# Patient Record
Sex: Female | Born: 1980 | Race: Black or African American | Hispanic: No | Marital: Single | Smoking: Current every day smoker
Health system: Southern US, Community
[De-identification: ages and names within clinical notes are randomized; demographics above are authoritative.]

## PROBLEM LIST (undated history)

## (undated) DIAGNOSIS — Z789 Other specified health status: Secondary | ICD-10-CM

## (undated) DIAGNOSIS — F1010 Alcohol abuse, uncomplicated: Secondary | ICD-10-CM

## (undated) DIAGNOSIS — F1990 Other psychoactive substance use, unspecified, uncomplicated: Secondary | ICD-10-CM

## (undated) HISTORY — PX: NO PAST SURGERIES: SHX2092

---

## 2011-04-28 ENCOUNTER — Inpatient Hospital Stay (INDEPENDENT_AMBULATORY_CARE_PROVIDER_SITE_OTHER)
Admission: RE | Admit: 2011-04-28 | Discharge: 2011-04-28 | Disposition: A | Discharge status: Home / Self Care | Payer: Medicaid Other | Source: Ambulatory Visit | Attending: Family Medicine | Admitting: Family Medicine

## 2011-04-28 DIAGNOSIS — K029 Dental caries, unspecified: Secondary | ICD-10-CM

## 2011-04-28 DIAGNOSIS — S025XXA Fracture of tooth (traumatic), initial encounter for closed fracture: Secondary | ICD-10-CM

## 2011-04-28 DIAGNOSIS — K089 Disorder of teeth and supporting structures, unspecified: Secondary | ICD-10-CM

## 2013-06-18 ENCOUNTER — Emergency Department (INDEPENDENT_AMBULATORY_CARE_PROVIDER_SITE_OTHER): Payer: Medicaid Other

## 2013-06-18 ENCOUNTER — Emergency Department (HOSPITAL_COMMUNITY)
Admission: EM | Admit: 2013-06-18 | Discharge: 2013-06-18 | Disposition: A | Discharge status: Home / Self Care | Payer: Medicaid Other | Source: Home / Self Care | Attending: Emergency Medicine | Admitting: Emergency Medicine

## 2013-06-18 ENCOUNTER — Encounter (HOSPITAL_COMMUNITY): Payer: Self-pay | Admitting: Emergency Medicine

## 2013-06-18 DIAGNOSIS — J40 Bronchitis, not specified as acute or chronic: Secondary | ICD-10-CM

## 2013-06-18 MED ORDER — HYDROCODONE-HOMATROPINE 5-1.5 MG/5ML PO SYRP: 5.0000 mL | ORAL_SOLUTION | Freq: Four times a day (QID) | ORAL | Status: DC | PRN

## 2013-06-18 MED ORDER — AZITHROMYCIN 250 MG PO TABS: 250.0000 mg | ORAL_TABLET | Freq: Every day | ORAL | Status: DC

## 2013-06-18 NOTE — ED Provider Notes (Signed)
  CSN: [id number]     Arrival date & time 06/18/13  5784 History     First MD Initiated Contact with Patient 06/18/13 [phone]     Chief Complaint  Patient presents with  . Cough   (Consider location/radiation/quality/duration/timing/severity/associated sxs/prior Treatment) Patient is a 32 y.o. female presenting with cough. The history is provided by the patient. No language interpreter was used.  Cough Cough characteristics:  Productive Sputum characteristics:  Yellow Severity:  Moderate Onset quality:  Gradual Timing:  Constant Progression:  Worsening Chronicity:  New Smoker: no   Relieved by:  Nothing Worsened by:  Nothing tried Associated symptoms: chest pain   Pt reports she has a cough and congestion.  Pt reports chest hurts to cough  History reviewed. No pertinent past medical history. History reviewed. No pertinent past surgical history. No family history on file. History  Substance Use Topics  . Smoking status: Current Every Day Smoker  . Smokeless tobacco: Not on file  . Alcohol Use: No   OB History   Grav Para Term Preterm Abortions TAB SAB Ect Mult Living                 Review of Systems  Respiratory: Positive for cough.   Cardiovascular: Positive for chest pain.  All other systems reviewed and are negative.    Allergies  Review of patient's allergies indicates no known allergies.  Home Medications   Current Outpatient Rx  Name  Route  Sig  Dispense  Refill  . Chlorphen-Pseudoephed-APAP (THERAFLU FLU/COLD PO)   Oral   Take by mouth.         . GuaiFENesin (MUCINEX PO)   Oral   Take by mouth.          BP 135/96  Pulse 101  Temp(Src) 98.3 F (36.8 C) (Oral)  Resp 22  SpO2 99%  LMP 06/08/2013 Physical Exam  Constitutional: She is oriented to person, place, and time. She appears well-developed and well-nourished.  HENT:  Head: Normocephalic.  Right Ear: External ear normal.  Left Ear: External ear normal.  Eyes: Conjunctivae and EOM  are normal. Pupils are equal, round, and reactive to light.  Neck: Normal range of motion.  Cardiovascular: Normal rate and normal heart sounds.   Pulmonary/Chest: Effort normal and breath sounds normal.  Abdominal: Soft.  Musculoskeletal: Normal range of motion.  Neurological: She is alert and oriented to person, place, and time. She has normal reflexes.  Skin: Skin is warm.  Psychiatric: She has a normal mood and affect.    ED Course   Procedures (including critical care time)  Labs Reviewed - No data to display Dg Chest 2 View  06/18/2013   *RADIOLOGY REPORT*  Clinical Data: 4-day history of cough.  CHEST - 2 VIEW  Comparison: None.  Findings: Cardiomediastinal silhouette unremarkable.  Lungs hyperinflated with mild central peribronchial thickening.  No confluent airspace consolidation.  No pleural effusions. Visualized bony thorax intact.  IMPRESSION: Hyperinflation and mild central peribronchial thickening consistent with asthma and/or bronchitis.  No evidence of localized airspace pneumonia.   Original Report Authenticated By: Hulan Saas, M.D.   1. Bronchitis     MDM  Rx for zithromax and hydromet cough syrup.   Stop smoking  Elson Areas, [place] 06/18/13 1017

## 2013-06-18 NOTE — ED Notes (Signed)
Chest congestion, feels like congestion in chest that she cannot cough phlegm up.  Patient denies sinus drainage, but sounds congested to this nurse.  Reports sore throat, both ears hurt , and coughing spells she is unable to control.

## 2013-06-18 NOTE — ED Provider Notes (Signed)
Medical screening examination/treatment/procedure(s) were performed by non-physician practitioner and as supervising physician I was immediately available for consultation/collaboration.  Leslee Home, M.D.  Reuben Likes, MD 06/18/13 [phone]

## 2013-10-31 ENCOUNTER — Inpatient Hospital Stay (HOSPITAL_COMMUNITY): Payer: Self-pay

## 2013-10-31 ENCOUNTER — Encounter (HOSPITAL_COMMUNITY): Payer: Self-pay

## 2013-10-31 ENCOUNTER — Inpatient Hospital Stay (HOSPITAL_COMMUNITY)
Admission: AD | Admit: 2013-10-31 | Discharge: 2013-10-31 | Discharge status: Against Medical Advice | Payer: Self-pay | Source: Ambulatory Visit | Attending: Obstetrics and Gynecology | Admitting: Obstetrics and Gynecology

## 2013-10-31 DIAGNOSIS — O99330 Smoking (tobacco) complicating pregnancy, unspecified trimester: Secondary | ICD-10-CM | POA: Insufficient documentation

## 2013-10-31 DIAGNOSIS — O26859 Spotting complicating pregnancy, unspecified trimester: Secondary | ICD-10-CM | POA: Insufficient documentation

## 2013-10-31 DIAGNOSIS — R109 Unspecified abdominal pain: Secondary | ICD-10-CM | POA: Insufficient documentation

## 2013-10-31 DIAGNOSIS — N898 Other specified noninflammatory disorders of vagina: Secondary | ICD-10-CM | POA: Insufficient documentation

## 2013-10-31 DIAGNOSIS — O99891 Other specified diseases and conditions complicating pregnancy: Secondary | ICD-10-CM | POA: Insufficient documentation

## 2013-10-31 DIAGNOSIS — O9989 Other specified diseases and conditions complicating pregnancy, childbirth and the puerperium: Principal | ICD-10-CM

## 2013-10-31 LAB — CBC
HEMATOCRIT: 31.1 % — AB (ref 36.0–46.0)
Hemoglobin: 10.3 g/dL — ABNORMAL LOW (ref 12.0–15.0)
MCH: 31.1 pg (ref 26.0–34.0)
MCHC: 33.1 g/dL (ref 30.0–36.0)
MCV: 94 fL (ref 78.0–100.0)
PLATELETS: 223 10*3/uL (ref 150–400)
RBC: 3.31 MIL/uL — ABNORMAL LOW (ref 3.87–5.11)
RDW: 14.3 % (ref 11.5–15.5)
WBC: 5.1 10*3/uL (ref 4.0–10.5)

## 2013-10-31 LAB — URINALYSIS, ROUTINE W REFLEX MICROSCOPIC
Bilirubin Urine: NEGATIVE
Glucose, UA: NEGATIVE mg/dL
HGB URINE DIPSTICK: NEGATIVE
Ketones, ur: NEGATIVE mg/dL
Leukocytes, UA: NEGATIVE
Nitrite: NEGATIVE
PH: 6 (ref 5.0–8.0)
Protein, ur: NEGATIVE mg/dL
SPECIFIC GRAVITY, URINE: 1.025 (ref 1.005–1.030)
UROBILINOGEN UA: 0.2 mg/dL (ref 0.0–1.0)

## 2013-10-31 LAB — WET PREP, GENITAL
Trich, Wet Prep: NONE SEEN
YEAST WET PREP: NONE SEEN

## 2013-10-31 LAB — HCG, QUANTITATIVE, PREGNANCY: hCG, Beta Chain, Quant, S: 242 m[IU]/mL — ABNORMAL HIGH (ref <5)

## 2013-10-31 LAB — ABO/RH: ABO/RH(D): O POS

## 2013-10-31 LAB — POCT PREGNANCY, URINE: PREG TEST UR: POSITIVE — AB

## 2013-10-31 NOTE — MAU Note (Signed)
Patient states she went to [facility] on 12-31 for a Depo shot and had a positive pregnancy test. Patient states she has not missed a period but her last period lasted 2 weeks. States she has been having a little cramping. No bleeding or discharge today.

## 2013-10-31 NOTE — MAU Provider Note (Signed)
History     CSN: [id number]  Arrival date and time: 10/31/13 1020   First Provider Initiated Contact with Patient 10/31/13 1221      Chief Complaint  Patient presents with  . Possible Pregnancy  . Abdominal Cramping   HPI Judith Lowe is a 33 YO G3P2002 female who is [redacted]w[redacted]d presenting to MAU c/o abdominal cramping and vaginal spotting.  Patient went to start Depo Shot on 12/31 and had positive pregnancy test.  Describes having a period in October, November, and lightly spotted for 2 weeks straight in December.  She is sexually active with one partner.  No birth control use for four years.  No significant PMH.  Has not been taking prenatal vitamins.  Admits to vaginal discharge (white, thick) with odor.  Denies nausea, vomiting, fever, vaginal itching, burning, dysuria, constipation, diarrhea, rectal bleeding.  Previous pregnancies were vaginal deliveries at term - no known complications.  History reviewed. No pertinent past medical history.  History reviewed. No pertinent past surgical history.  History reviewed. No pertinent family history.  History  Substance Use Topics  . Smoking status: Current Every Day Smoker -- 0.25 packs/day  . Smokeless tobacco: Not on file  . Alcohol Use: No    Allergies: No Known Allergies  Prescriptions prior to admission  Medication Sig Dispense Refill  . azithromycin (ZITHROMAX) 250 MG tablet Take 1 tablet (250 mg total) by mouth daily. Take first 2 tablets together, then 1 every day until finished.  6 tablet  0  . Chlorphen-Pseudoephed-APAP (THERAFLU FLU/COLD PO) Take by mouth.      . GuaiFENesin (MUCINEX PO) Take by mouth.      Marland Kitchen HYDROcodone-homatropine (HYDROMET) 5-1.5 MG/5ML syrup Take 5 mLs by mouth every 6 (six) hours as needed for cough.  120 mL  0    Review of Systems  Constitutional: Negative for fever and chills.  Respiratory: Negative for shortness of breath.   Cardiovascular: Negative for chest pain.  Gastrointestinal: Positive for  abdominal pain (RLQ). Negative for nausea, vomiting, diarrhea, constipation and blood in stool.  Genitourinary: Negative for dysuria, urgency and frequency.       Vaginal discharge (white, thick)  Skin: Negative for rash.   Physical Exam   Blood pressure 128/77, pulse 70, temperature 98.5 F (36.9 C), temperature source Oral, resp. rate 16, height 5' 7.75" (1.721 m), weight 65.318 kg (144 lb), last menstrual period 10/08/2013, SpO2 100.00%.  Physical Exam  Constitutional: She is oriented to person, place, and time. She appears well-developed and well-nourished.  HENT:  Head: Normocephalic.  Neck: Normal range of motion. Neck supple.  Cardiovascular: Normal rate, regular rhythm and normal heart sounds.   Respiratory: Effort normal and breath sounds normal. No respiratory distress.  GI: Soft. She exhibits no mass. There is tenderness (RLQ). There is no rebound and no guarding.  Genitourinary: Uterus is not tender. Cervix exhibits discharge. Cervix exhibits no motion tenderness and no friability. Right adnexum displays tenderness. Left adnexum displays no tenderness. Vaginal discharge (white, thin) found.  Lymphadenopathy:       Right: Inguinal adenopathy present.  Neurological: She is alert and oriented to person, place, and time.  Skin: Skin is warm and dry.   Pt left without receiving ultrasound due to needing to pick up daughter.  Notified by RN regarding departure after patient had left the building > pt called at number listed for home contact [phone] > disconnected.    MAU Course  Procedures   Assessment and Plan  [facility] 10/31/2013, 12:33 PM

## 2013-10-31 NOTE — MAU Note (Signed)
[facility] but was told she had a + pregnancy test. She now presents to MAU with cramps today but no bleeding.

## 2013-11-01 LAB — GC/CHLAMYDIA PROBE AMP
CT Probe RNA: NEGATIVE
GC PROBE AMP APTIMA: NEGATIVE

## 2013-11-01 NOTE — MAU Provider Note (Signed)
Attestation of Attending Supervision of Advanced Practitioner (CNM/NP): Evaluation and management procedures were performed by the Advanced Practitioner under my supervision and collaboration.  I have reviewed the Advanced Practitioner's note and chart, and I agree with the management and plan.  Yossi Hinchman 11/01/2013 7:38 AM

## 2013-12-11 ENCOUNTER — Inpatient Hospital Stay (HOSPITAL_COMMUNITY): Payer: Self-pay

## 2013-12-11 ENCOUNTER — Encounter (HOSPITAL_COMMUNITY): Payer: Self-pay | Admitting: *Deleted

## 2013-12-11 ENCOUNTER — Inpatient Hospital Stay (HOSPITAL_COMMUNITY)
Admission: AD | Admit: 2013-12-11 | Discharge: 2013-12-11 | Disposition: A | Discharge status: Home / Self Care | Payer: Self-pay | Source: Ambulatory Visit | Attending: Obstetrics & Gynecology | Admitting: Obstetrics & Gynecology

## 2013-12-11 DIAGNOSIS — B9689 Other specified bacterial agents as the cause of diseases classified elsewhere: Secondary | ICD-10-CM

## 2013-12-11 DIAGNOSIS — O039 Complete or unspecified spontaneous abortion without complication: Secondary | ICD-10-CM

## 2013-12-11 DIAGNOSIS — N760 Acute vaginitis: Secondary | ICD-10-CM

## 2013-12-11 DIAGNOSIS — F17200 Nicotine dependence, unspecified, uncomplicated: Secondary | ICD-10-CM | POA: Insufficient documentation

## 2013-12-11 DIAGNOSIS — O036 Delayed or excessive hemorrhage following complete or unspecified spontaneous abortion: Secondary | ICD-10-CM | POA: Insufficient documentation

## 2013-12-11 DIAGNOSIS — A499 Bacterial infection, unspecified: Secondary | ICD-10-CM | POA: Insufficient documentation

## 2013-12-11 HISTORY — DX: Other specified health status: Z78.9

## 2013-12-11 LAB — URINALYSIS, ROUTINE W REFLEX MICROSCOPIC
Bilirubin Urine: NEGATIVE
GLUCOSE, UA: NEGATIVE mg/dL
Ketones, ur: NEGATIVE mg/dL
Nitrite: NEGATIVE
Protein, ur: 30 mg/dL — AB
Urobilinogen, UA: 1 mg/dL (ref 0.0–1.0)
pH: 5.5 (ref 5.0–8.0)

## 2013-12-11 LAB — CBC
HEMATOCRIT: 33.6 % — AB (ref 36.0–46.0)
HEMOGLOBIN: 11.2 g/dL — AB (ref 12.0–15.0)
MCH: 31.3 pg (ref 26.0–34.0)
MCHC: 33.3 g/dL (ref 30.0–36.0)
MCV: 93.9 fL (ref 78.0–100.0)
Platelets: 171 10*3/uL (ref 150–400)
RBC: 3.58 MIL/uL — AB (ref 3.87–5.11)
RDW: 13.8 % (ref 11.5–15.5)
WBC: 8.7 10*3/uL (ref 4.0–10.5)

## 2013-12-11 LAB — URINE MICROSCOPIC-ADD ON

## 2013-12-11 LAB — WET PREP, GENITAL
TRICH WET PREP: NONE SEEN
Yeast Wet Prep HPF POC: NONE SEEN

## 2013-12-11 LAB — HCG, QUANTITATIVE, PREGNANCY: hCG, Beta Chain, Quant, S: 11 m[IU]/mL — ABNORMAL HIGH (ref <5)

## 2013-12-11 MED ORDER — OXYCODONE-ACETAMINOPHEN 5-325 MG PO TABS: 1.0000 | ORAL_TABLET | ORAL | Status: DC | PRN

## 2013-12-11 MED ORDER — HYDROMORPHONE HCL PF 1 MG/ML IJ SOLN
1.0000 mg | Freq: Once | INTRAMUSCULAR | Status: AC
  Administered 2013-12-11: 1 mg via INTRAMUSCULAR
  Filled 2013-12-11: qty 1

## 2013-12-11 MED ORDER — AZITHROMYCIN 250 MG PO TABS
1000.0000 mg | ORAL_TABLET | Freq: Once | ORAL | Status: AC
  Administered 2013-12-11: 1000 mg via ORAL
  Filled 2013-12-11: qty 4

## 2013-12-11 MED ORDER — CEFTRIAXONE SODIUM 250 MG IJ SOLR
250.0000 mg | Freq: Once | INTRAMUSCULAR | Status: AC
  Administered 2013-12-11: 250 mg via INTRAMUSCULAR
  Filled 2013-12-11: qty 250

## 2013-12-11 MED ORDER — METRONIDAZOLE 500 MG PO TABS: 500.0000 mg | ORAL_TABLET | Freq: Two times a day (BID) | ORAL | Status: DC

## 2013-12-11 NOTE — MAU Note (Signed)
Started bleeding last night at work, kind of heavy like a period, past a few quarter sized clots this morning. Cramping in lower abd.

## 2013-12-11 NOTE — MAU Provider Note (Signed)
History     CSN: [id number]  Arrival date and time: 12/11/13 1346   First Provider Initiated Contact with Patient 12/11/13 1420      Chief Complaint  Patient presents with  . Vaginal Bleeding   HPI [name] is a 33 y.o. G3P2002 at [redacted]w[redacted]d who presents to MAU today with complaint of vaginal bleeding since last night. She states that the bleeding is similar to a normal period with small clots. She is also having moderate lower abdominal cramping. She rates her pain at 7/10 now. She denies N/V/D, UTI symptoms, vaginal discharge or fever.   OB History   Grav Para Term Preterm Abortions TAB SAB Ect Mult Living   3 2 2       2       Past Medical History  Diagnosis Date  . Medical history non-contributory     Past Surgical History  Procedure Laterality Date  . No past surgeries      History reviewed. No pertinent family history.  History  Substance Use Topics  . Smoking status: Current Every Day Smoker -- 0.25 packs/day for 5 years    Types: Cigarettes  . Smokeless tobacco: Never Used  . Alcohol Use: Yes     Comment: occaisionally prior to preg    Allergies: No Known Allergies  No prescriptions prior to admission    Review of Systems  Constitutional: Positive for malaise/fatigue. Negative for fever.  Gastrointestinal: Positive for abdominal pain. Negative for nausea, vomiting, diarrhea and constipation.  Genitourinary: Negative for dysuria, urgency and frequency.       Neg - vaginal discharge + vaginal bleeding  Neurological: Negative for dizziness, loss of consciousness and weakness.   Physical Exam   Blood pressure 140/80, pulse 102, temperature 100.1 F (37.8 C), temperature source Oral, resp. rate 18, height 5\' 8"  (1.727 m), weight 146 lb (66.225 kg), last menstrual period 10/08/2013.  Physical Exam  Constitutional: She is oriented to person, place, and time. She appears well-developed and well-nourished. No distress.  HENT:  Head: Normocephalic and  atraumatic.  Cardiovascular: Normal rate, regular rhythm and normal heart sounds.   Respiratory: Effort normal and breath sounds normal. No respiratory distress.  GI: Soft. Bowel sounds are normal. She exhibits no distension and no mass. There is tenderness (moderate tenderness to palpation of the lower abdomen bilaterally). There is no rebound and no guarding.  Genitourinary: Uterus is enlarged and tender. Cervix exhibits no motion tenderness, no discharge and no friability. Right adnexum displays tenderness (mild to moderate). Right adnexum displays no mass. Left adnexum displays tenderness. Left adnexum displays no mass. There is bleeding (small amount of thin, light brown blood noted) around the vagina. No vaginal discharge found.  Neurological: She is alert and oriented to person, place, and time.  Skin: Skin is warm and dry. No erythema.  Psychiatric: She has a normal mood and affect.   Results for orders placed during the hospital encounter of 12/11/13 (from the past 24 hour(s))  WET PREP, GENITAL     Status: Abnormal   Collection Time    12/11/13  2:00 PM      Result Value Ref Range   Yeast Wet Prep HPF POC NONE SEEN  NONE SEEN   Trich, Wet Prep NONE SEEN  NONE SEEN   Clue Cells Wet Prep HPF POC MODERATE (*) NONE SEEN   WBC, Wet Prep HPF POC TOO NUMEROUS TO COUNT (*) NONE SEEN  URINALYSIS, ROUTINE W REFLEX MICROSCOPIC  Status: Abnormal   Collection Time    12/11/13  2:05 PM      Result Value Ref Range   Color, Urine AMBER (*) YELLOW   APPearance CLOUDY (*) CLEAR   Specific Gravity, Urine >1.030 (*) 1.005 - 1.030   pH 5.5  5.0 - 8.0   Glucose, UA NEGATIVE  NEGATIVE mg/dL   Hgb urine dipstick LARGE (*) NEGATIVE   Bilirubin Urine NEGATIVE  NEGATIVE   Ketones, ur NEGATIVE  NEGATIVE mg/dL   Protein, ur 30 (*) NEGATIVE mg/dL   Urobilinogen, UA 1.0  0.0 - 1.0 mg/dL   Nitrite NEGATIVE  NEGATIVE   Leukocytes, UA MODERATE (*) NEGATIVE  URINE MICROSCOPIC-ADD ON     Status: Abnormal    Collection Time    12/11/13  2:05 PM      Result Value Ref Range   Squamous Epithelial / LPF MANY (*) RARE   WBC, UA TOO NUMEROUS TO COUNT  <3 WBC/hpf   RBC / HPF 21-50  <3 RBC/hpf   Bacteria, UA RARE  RARE   Urine-Other MUCOUS PRESENT    CBC     Status: Abnormal   Collection Time    12/11/13  2:35 PM      Result Value Ref Range   WBC 8.7  4.0 - 10.5 K/uL   RBC 3.58 (*) 3.87 - 5.11 MIL/uL   Hemoglobin 11.2 (*) 12.0 - 15.0 g/dL   HCT 33.6 (*) 36.0 - 46.0 %   MCV 93.9  78.0 - 100.0 fL   MCH 31.3  26.0 - 34.0 pg   MCHC 33.3  30.0 - 36.0 g/dL   RDW 13.8  11.5 - 15.5 %   Platelets 171  150 - 400 K/uL  HCG, QUANTITATIVE, PREGNANCY     Status: Abnormal   Collection Time    12/11/13  2:35 PM      Result Value Ref Range   hCG, Beta Chain, Quant, S 11 (*) <5 mIU/mL   US Ob Comp Less 14 Wks  12/11/2013   CLINICAL DATA:  Vaginal bleeding.  Unsure of LMP.  EXAM: OBSTETRIC <14 WK [place] AND TRANSVAGINAL OB US  TECHNIQUE: Both transabdominal and transvaginal ultrasound examinations were performed for complete evaluation of the gestation as well as the maternal uterus, adnexal regions, and pelvic cul-de-sac. Transvaginal technique was performed to assess early pregnancy.  COMPARISON:  None.  FINDINGS: No intrauterine gestational sac visualized. The endometrium is diffusely thickened measuring approximately 30 mm and shows multiple small hypoechoic cystic foci internally. There is also increased vascularity the adjacent uterine myometrium. This raises suspicion for gestational trophoblastic neoplasia.  A single well-circumscribed myometrial mass measuring 2.3 cm is seen in the right anterior corpus, consistent with a small uterine fibroid.  Both ovaries are normal in appearance. No adnexal masses are identified. Tiny amount of simple free fluid seen in pelvic cul-de-sac.  IMPRESSION: No IUP or adnexal mass visualized.  Diffuse endometrial thickening with numerous tiny cystic foci, and hypervascularity of  adjacent uterine myometrium. Differential diagnosis includes just a struck trophoblastic neoplasia in retained products of conception. Less likely considerations including atypical endometrial thickening due to IUP too early to visualize or occult ectopic pregnancy. Recommend correlation with quantitative beta HCG level, and followup by ultrasound if clinically warranted.   Electronically Signed   By: Earle Gell M.D.   On: 12/11/2013 15:34   US Ob Transvaginal  12/11/2013   CLINICAL DATA:  Vaginal bleeding.  Unsure of LMP.  EXAM: OBSTETRIC <14  WK [place] AND TRANSVAGINAL OB US  TECHNIQUE: Both transabdominal and transvaginal ultrasound examinations were performed for complete evaluation of the gestation as well as the maternal uterus, adnexal regions, and pelvic cul-de-sac. Transvaginal technique was performed to assess early pregnancy.  COMPARISON:  None.  FINDINGS: No intrauterine gestational sac visualized. The endometrium is diffusely thickened measuring approximately 30 mm and shows multiple small hypoechoic cystic foci internally. There is also increased vascularity the adjacent uterine myometrium. This raises suspicion for gestational trophoblastic neoplasia.  A single well-circumscribed myometrial mass measuring 2.3 cm is seen in the right anterior corpus, consistent with a small uterine fibroid.  Both ovaries are normal in appearance. No adnexal masses are identified. Tiny amount of simple free fluid seen in pelvic cul-de-sac.  IMPRESSION: No IUP or adnexal mass visualized.  Diffuse endometrial thickening with numerous tiny cystic foci, and hypervascularity of adjacent uterine myometrium. Differential diagnosis includes just a struck trophoblastic neoplasia in retained products of conception. Less likely considerations including atypical endometrial thickening due to IUP too early to visualize or occult ectopic pregnancy. Recommend correlation with quantitative beta HCG level, and followup by ultrasound if  clinically warranted.   Electronically Signed   By: Earle Gell M.D.   On: 12/11/2013 15:34    MAU Course  Procedures None  MDM UA, CBC, quant hCG and [place] today Discussed [place] results with Dr. Roselie Awkward. Follow-up in 1 week for follow-up quant hCG GC/Chlamydia ordered - malodorous discharge noted on exam with moderate TTP, suspicious for infection 1G Zithromax and 250 mg Rocephin given in MAU Assessment and Plan  A: SAB Bacterial vaginosis Lower abdominal pain  P: Discharge home Rx for Flagyl and Percocet given/sent to patient's pharmacy Patient to follow-up in [facility] clinic on 12/18/13 @ 9:00 am for follow-up labs Patient may return to MAU as needed or if her condition were to change or worsen  Farris Has, PA-C  12/11/2013, 3:48 PM

## 2013-12-11 NOTE — Discharge Instructions (Signed)
Bacterial Vaginosis Bacterial vaginosis is a vaginal infection that occurs when the normal balance of bacteria in the vagina is disrupted. It results from an overgrowth of certain bacteria. This is the most common vaginal infection in women of childbearing age. Treatment is important to prevent complications, especially in pregnant women, as it can cause a premature delivery. CAUSES  Bacterial vaginosis is caused by an increase in harmful bacteria that are normally present in smaller amounts in the vagina. Several different kinds of bacteria can cause bacterial vaginosis. However, the reason that the condition develops is not fully understood. RISK FACTORS Certain activities or behaviors can put you at an increased risk of developing bacterial vaginosis, including:  Having a new sex partner or multiple sex partners.  Douching.  Using an intrauterine device (IUD) for contraception. Women do not get bacterial vaginosis from toilet seats, bedding, swimming pools, or contact with objects around them. SIGNS AND SYMPTOMS  Some women with bacterial vaginosis have no signs or symptoms. Common symptoms include:  Grey vaginal discharge.  A fishlike odor with discharge, especially after sexual intercourse.  Itching or burning of the vagina and vulva.  Burning or pain with urination. DIAGNOSIS  Your health care provider will take a medical history and examine the vagina for signs of bacterial vaginosis. A sample of vaginal fluid may be taken. Your health care provider will look at this sample under a microscope to check for bacteria and abnormal cells. A vaginal pH test may also be done.  TREATMENT  Bacterial vaginosis may be treated with antibiotic medicines. These may be given in the form of a pill or a vaginal cream. A second round of antibiotics may be prescribed if the condition comes back after treatment.  HOME CARE INSTRUCTIONS   Only take over-the-counter or prescription medicines as  directed by your health care provider.  If antibiotic medicine was prescribed, take it as directed. Make sure you finish it even if you start to feel better.  Do not have sex until treatment is completed.  Tell all sexual partners that you have a vaginal infection. They should see their health care provider and be treated if they have problems, such as a mild rash or itching.  Practice safe sex by using condoms and only having one sex partner. SEEK MEDICAL CARE IF:   Your symptoms are not improving after 3 days of treatment.  You have increased discharge or pain.  You have a fever. MAKE SURE YOU:   Understand these instructions.  Will watch your condition.  Will get help right away if you are not doing well or get worse. FOR MORE INFORMATION  Centers for Disease Control and Prevention, Division of STD Prevention: [employer]raiser[employer]raud[employer] American Sexual Health Association (ASHA): www.ashastd.org  Document Released: 10/12/2005 Document Revised: 08/02/2013 Document Reviewed: 05/24/2013 [facility] Patient Information 2014 [place]. Miscarriage A miscarriage is the loss of an unborn baby (fetus) before the 20th week of pregnancy. The cause is often unknown.  HOME CARE  You may need to stay in bed (bed rest), or you may be able to do light activity. Go about activity as told by your doctor.  Have help at home.  Write down how many pads you use each day. Write down how soaked they are.  Do not use tampons. Do not wash out your vagina (douche) or have sex (intercourse) until your doctor approves.  Only take medicine as told by your doctor.  Do not take aspirin.  Keep all doctor visits as told.  If you or your partner have problems with grieving, talk to your doctor. You can also try counseling. Give yourself time to grieve before trying to get pregnant again. GET HELP RIGHT AWAY IF:  You have bad cramps or pain in your back or belly (abdomen).  You have a fever.  You pass  large clumps of blood (clots) from your vagina that are walnut-sized or larger. Save the clumps for your doctor to see.  You pass large amounts of tissue from your vagina. Save the tissue for your doctor to see.  You have more bleeding.  You have thick, bad-smelling fluid (discharge) coming from the vagina.  You get lightheaded, weak, or you pass out (faint).  You have chills. MAKE SURE YOU:  Understand these instructions.  Will watch your condition.  Will get help right away if you are not doing well or get worse. Document Released: 01/04/2012 Document Reviewed: 01/04/2012 [facility] Patient Information 2014 [place].

## 2013-12-18 ENCOUNTER — Other Ambulatory Visit: Payer: Self-pay

## 2014-01-03 ENCOUNTER — Encounter (HOSPITAL_COMMUNITY): Payer: Self-pay

## 2014-01-03 ENCOUNTER — Inpatient Hospital Stay (HOSPITAL_COMMUNITY)
Admission: AD | Admit: 2014-01-03 | Discharge: 2014-01-03 | Disposition: A | Discharge status: Home / Self Care | Payer: Self-pay | Source: Ambulatory Visit | Attending: Obstetrics & Gynecology | Admitting: Obstetrics & Gynecology

## 2014-01-03 DIAGNOSIS — Z8759 Personal history of other complications of pregnancy, childbirth and the puerperium: Secondary | ICD-10-CM

## 2014-01-03 DIAGNOSIS — N920 Excessive and frequent menstruation with regular cycle: Secondary | ICD-10-CM

## 2014-01-03 DIAGNOSIS — F17200 Nicotine dependence, unspecified, uncomplicated: Secondary | ICD-10-CM | POA: Insufficient documentation

## 2014-01-03 DIAGNOSIS — R109 Unspecified abdominal pain: Secondary | ICD-10-CM | POA: Insufficient documentation

## 2014-01-03 DIAGNOSIS — N898 Other specified noninflammatory disorders of vagina: Secondary | ICD-10-CM | POA: Insufficient documentation

## 2014-01-03 LAB — POCT PREGNANCY, URINE: Preg Test, Ur: NEGATIVE

## 2014-01-03 NOTE — MAU Provider Note (Signed)
History     CSN: [id number]  Arrival date and time: 01/03/14 1025   First Provider Initiated Contact with Patient 01/03/14 [phone]      Chief Complaint  Patient presents with  . Vaginal Bleeding   HPI This is a 33 y.o. female who presents with c/o bleeding since SAB on 2/19 which was light and then got heavier over the past day or so. Has some cramping.   RN Note: Patient states she had a SAB on 2-19. States she has continued to have bleeding every day since. Had gotten lighter, now heavier and changing pads 10 times per day. Has mild cramping.        OB History as of 01/03/14   Grav Para Term Preterm Abortions TAB SAB Ect Mult Living   3 2 2  1  1   2       Past Medical History  Diagnosis Date  . Medical history non-contributory     Past Surgical History  Procedure Laterality Date  . No past surgeries      History reviewed. No pertinent family history.  History  Substance Use Topics  . Smoking status: Current Every Day Smoker -- 0.25 packs/day for 5 years    Types: Cigarettes  . Smokeless tobacco: Never Used  . Alcohol Use: Yes     Comment: occaisionally prior to preg    Allergies: No Known Allergies  Prescriptions prior to admission  Medication Sig Dispense Refill  . metroNIDAZOLE (FLAGYL) 500 MG tablet Take 1 tablet (500 mg total) by mouth 2 (two) times daily.  14 tablet  0  . oxyCODONE-acetaminophen (PERCOCET/ROXICET) 5-325 MG per tablet Take 1-2 tablets by mouth every 4 (four) hours as needed for severe pain.  20 tablet  0    Review of Systems  Constitutional: Negative for fever, chills and malaise/fatigue.  Gastrointestinal: Positive for abdominal pain (mild cramping). Negative for nausea and vomiting.  Genitourinary: Negative for dysuria.  Musculoskeletal: Negative for myalgias.  Neurological: Negative for dizziness.   Physical Exam   Blood pressure 124/86, pulse 78, temperature 98.7 F (37.1 C), temperature source Oral, resp. rate 16, height 5'  8.5" (1.74 m), weight 63.413 kg (139 lb 12.8 oz), last menstrual period 10/08/2013, SpO2 100.00%, unknown if currently breastfeeding.  Physical Exam  Constitutional: She is oriented to person, place, and time. She appears well-developed and well-nourished. No distress.  HENT:  Head: Normocephalic.  Cardiovascular: Normal rate.   Respiratory: Effort normal.  GI: Soft. She exhibits no distension. There is no tenderness. There is no rebound and no guarding.  Genitourinary: Uterus normal. Vaginal discharge (small bleeding) found.  Musculoskeletal: Normal range of motion.  Neurological: She is alert and oriented to person, place, and time.  Skin: Skin is warm and dry.  Psychiatric: She has a normal mood and affect.    MAU Course  Procedures  MDM Results for orders placed during the hospital encounter of 01/03/14 (from the past 24 hour(s))  POCT PREGNANCY, URINE     Status: None   Collection Time    01/03/14  8:27 AM      Result Value Ref Range   Preg Test, Ur NEGATIVE  NEGATIVE    Assessment and Plan  A:  Bleeding after SAB      Most likely is menses       No current hemorrhage  P:  Discussed with patient.       Will use ibuprofen for now.  Bleeding precautions        Come back in if persists or gets heavier.  [facility] 01/03/2014, 8:53 AM

## 2014-01-03 NOTE — MAU Note (Signed)
Patient states she had a SAB on 2-19. States she has continued to have bleeding every day since. Had gotten lighter, now heavier and changing pads 10 times per day. Has mild cramping.

## 2014-01-03 NOTE — Discharge Instructions (Signed)
Menstruation Menstruation is the monthly passing of blood, tissue, fluid and mucus, also know as a period. Your body is shedding the lining of the uterus. The flow, or amount of blood, usually lasts from 3 7 days each month. Hormones control the menstrual cycle. Hormones are a chemical substance produced by endocrine glands in the body to regulate different bodily functions. The first menstrual period may start any time between age 35 years to 16 years. However, it usually starts around age 12 years. Some girls have regular monthly menstrual cycles right from the beginning. However, it is not unusual to have only a couple of drops of blood or spotting when you first start menstruating. It is also not unusual to have two periods a month or miss a month or two when first starting your periods. SYMPTOMS   Mild to moderate abdominal cramps.  Aching or pain in the lower back area. Symptoms may occur 5 10 days before your menstrual period starts. These symptoms are referred to as premenstrual syndrome (PMS). These symptoms can include:  Headache.  Breast tenderness and swelling.  Bloating.  Tiredness (fatigue).  Mood changes.  Craving for certain foods. These are normal signs and symptoms and can vary in severity. To help relieve these problems, ask your caregiver if you can take over-the-counter medications for pain or discomfort. If the symptoms are not controllable, see your caregiver for help.  HORMONES INVOLVED IN MENSTRUATION Menstruation comes about because of hormones produced by the pituitary gland in the brain and the ovaries that affect the uterine lining. First, the pituitary gland in the brain produces the hormone follicle stimulating hormone (FSH). FSH stimulates the ovaries to produce estrogen, which thickens the uterine lining and begins to develop an egg in the ovary. About 14 days later, the pituitary gland produces another hormone called luteinizing hormone (LH). LH causes the egg  to come out of a sac in the ovary (ovulation). The empty sac on the ovary called the corpus luteum is stimulated by another hormone from the pituitary gland called luteotropin. The corpus luteum begins to produce the estrogen and progesterone hormone. The progesterone hormone prepares the lining of the uterus to have the fertilized egg (egg combined with sperm) attach to the lining of the uterus and begin to develop into a fetus. If the egg is not fertilized, the corpus luteum stops producing estrogen and progesterone, it disappears, the lining of the uterus sloughs off and a menstrual period begins. Then the menstrual cycle starts all over again and will continue monthly unless pregnancy occurs or menopause begins. The secretion of hormones is complex. Various parts of the body become involved in many chemical activities. Female sex hormones have other functions in a woman's body as well. Estrogen increases a woman's sex drive (libido). It naturally helps body get rid of fluids (diuretic). It also aids in the process of building new bone. Therefore, maintaining hormonal health is essential to all levels of a woman's well being. These hormones are usually present in normal amounts and cause you to menstruate. It is the relationship between the (small) levels of the hormones that is critical. When the balance is upset, menstrual irregularities can occur. HOW DOES THE MENSTRUAL CYCLE HAPPEN?  Menstrual cycles vary in length from 21 35 days with an average of 29 days. The cycle begins on the first day of bleeding. At this time, the pituitary gland in the brain releases FSH that travels through the bloodstream to the ovaries. The FSH stimulates the   follicles in the ovaries. This prepares the body for ovulation that occurs around the 14th day of the cycle. The ovaries produce estrogen, and this makes sure conditions are right in the uterus for implantation of the fertilized egg.  When the levels of estrogen reach a  high enough level, it signals the gland in the brain (pituitary gland) to release a surge of LH. This causes the release of the ripest egg from its follicle (ovulation). Usually only one follicle releases one egg, but sometimes more than one follicle releases an egg especially when stimulating the ovaries for in vitro fertilization. The egg can then be collected by either fallopian tube to await fertilization. The burst follicle within the ovary that is left behind is now called the corpus luteum or "yellow body." The corpus luteum continues to give off (secrete) reduced amounts of estrogen. This closes and hardens the cervix. It dries up the mucus to the naturally infertile condition.  The corpus luteum also begins to give off greater amounts of progesterone. This causes the lining of the uterus (endometrium) to thicken even more in preparation for the fertilized egg. The egg is starting to journey down from the fallopian tube to the uterus. It also signals the ovaries to stop releasing eggs. It assists in returning the cervical mucus to its infertile state.  If the egg implants successfully into the womb lining and pregnancy occurs, progesterone levels will continue to raise. It is often this hormone that gives some pregnant women a feeling of well being, like a "natural high." Progesterone levels drop again after childbirth.  If fertilization does not occur, the corpus luteum dies, stopping the production of hormones. This sudden drop in progesterone causes the uterine lining to break down, accompanied by blood (menstruation).  This starts the cycle back at day 1. The whole process starts all over again. Woman go through this cycle every month from puberty to menopause. Women have breaks only for pregnancy and breastfeeding (lactation), unless the woman has health problems that affect the female hormone system or chooses to use oral contraceptives to have unnatural menstrual periods. HOME CARE  INSTRUCTIONS   Keep track of your periods by using a calendar.  If you use tampons, get the least absorbent to avoid toxic shock syndrome.  Do not leave tampons in the vagina over night or longer than 6 hours.  Wear a sanitary pad over night.  Exercise 3 5 times a week or more.  Avoid foods and drinks that you know will make your symptoms worse before or during your period. SEEK MEDICAL CARE IF:   You develop a fever with your period.  Your periods are lasting more than 7 days.  Your period is so heavy that you have to change pads or tampons every 30 minutes.  You develop clots with your period and never had clots before.  You cannot get relief from over-the-counter medication for your symptoms.  Your period has not started, and it has been longer than 35 days. Document Released: 10/02/2002 Document Revised: 08/02/2013 Document Reviewed: 05/11/2013 [facility] Patient Information 2014 [place], [place]. Contraception Choices Contraception (birth control) is the use of any methods or devices to prevent pregnancy. Below are some methods to help avoid pregnancy. HORMONAL METHODS   Contraceptive implant This is a thin, plastic tube containing progesterone hormone. It does not contain estrogen hormone. Your health care provider inserts the tube in the inner part of the upper arm. The tube can remain in place for up to  3 years. After 3 years, the implant must be removed. The implant prevents the ovaries from releasing an egg (ovulation), thickens the cervical mucus to prevent sperm from entering the uterus, and thins the lining of the inside of the uterus.  Progesterone-only injections These injections are given every 3 months by your health care provider to prevent pregnancy. This synthetic progesterone hormone stops the ovaries from releasing eggs. It also thickens cervical mucus and changes the uterine lining. This makes it harder for sperm to survive in the uterus.  Birth control pills  These pills contain estrogen and progesterone hormone. They work by preventing the ovaries from releasing eggs (ovulation). They also cause the cervical mucus to thicken, preventing the sperm from entering the uterus. Birth control pills are prescribed by a health care provider.Birth control pills can also be used to treat heavy periods.  Minipill This type of birth control pill contains only the progesterone hormone. They are taken every day of each month and must be prescribed by your health care provider.  Birth control patch The patch contains hormones similar to those in birth control pills. It must be changed once a week and is prescribed by a health care provider.  Vaginal ring The ring contains hormones similar to those in birth control pills. It is left in the vagina for 3 weeks, removed for 1 week, and then a new one is put back in place. The patient must be comfortable inserting and removing the ring from the vagina.A health care provider's prescription is necessary.  Emergency contraception Emergency contraceptives prevent pregnancy after unprotected sexual intercourse. This pill can be taken right after sex or up to 5 days after unprotected sex. It is most effective the sooner you take the pills after having sexual intercourse. Most emergency contraceptive pills are available without a prescription. Check with your pharmacist. Do not use emergency contraception as your only form of birth control. BARRIER METHODS   Female condom This is a thin sheath (latex or rubber) that is worn over the penis during sexual intercourse. It can be used with spermicide to increase effectiveness.  Female condom. This is a soft, loose-fitting sheath that is put into the vagina before sexual intercourse.  Diaphragm This is a soft, latex, dome-shaped barrier that must be fitted by a health care provider. It is inserted into the vagina, along with a spermicidal jelly. It is inserted before intercourse. The  diaphragm should be left in the vagina for 6 to 8 hours after intercourse.  Cervical cap This is a round, soft, latex or plastic cup that fits over the cervix and must be fitted by a health care provider. The cap can be left in place for up to 48 hours after intercourse.  Sponge This is a soft, circular piece of polyurethane foam. The sponge has spermicide in it. It is inserted into the vagina after wetting it and before sexual intercourse.  Spermicides These are chemicals that kill or block sperm from entering the cervix and uterus. They come in the form of creams, jellies, suppositories, foam, or tablets. They do not require a prescription. They are inserted into the vagina with an applicator before having sexual intercourse. The process must be repeated every time you have sexual intercourse. INTRAUTERINE CONTRACEPTION  Intrauterine device (IUD) This is a T-shaped device that is put in a woman's uterus during a menstrual period to prevent pregnancy. There are 2 types:  Copper IUD This type of IUD is wrapped in copper wire and is  placed inside the uterus. Copper makes the uterus and fallopian tubes produce a fluid that kills sperm. It can stay in place for 10 years.  Hormone IUD This type of IUD contains the hormone progestin (synthetic progesterone). The hormone thickens the cervical mucus and prevents sperm from entering the uterus, and it also thins the uterine lining to prevent implantation of a fertilized egg. The hormone can weaken or kill the sperm that get into the uterus. It can stay in place for 3 5 years, depending on which type of IUD is used. PERMANENT METHODS OF CONTRACEPTION  Female tubal ligation This is when the woman's fallopian tubes are surgically sealed, tied, or blocked to prevent the egg from traveling to the uterus.  Hysteroscopic sterilization This involves placing a small coil or insert into each fallopian tube. Your doctor uses a technique called hysteroscopy to do the  procedure. The device causes scar tissue to form. This results in permanent blockage of the fallopian tubes, so the sperm cannot fertilize the egg. It takes about 3 months after the procedure for the tubes to become blocked. You must use another form of birth control for these 3 months.  Female sterilization This is when the female has the tubes that carry sperm tied off (vasectomy).This blocks sperm from entering the vagina during sexual intercourse. After the procedure, the man can still ejaculate fluid (semen). NATURAL PLANNING METHODS  Natural family planning This is not having sexual intercourse or using a barrier method (condom, diaphragm, cervical cap) on days the woman could become pregnant.  Calendar method This is keeping track of the length of each menstrual cycle and identifying when you are fertile.  Ovulation method This is avoiding sexual intercourse during ovulation.  Symptothermal method This is avoiding sexual intercourse during ovulation, using a thermometer and ovulation symptoms.  Post ovulation method This is timing sexual intercourse after you have ovulated. Regardless of which type or method of contraception you choose, it is important that you use condoms to protect against the transmission of sexually transmitted infections (STIs). Talk with your health care provider about which form of contraception is most appropriate for you. Document Released: 10/12/2005 Document Revised: 06/14/2013 Document Reviewed: 04/06/2013 [facility] Patient Information 2014 [place].

## 2014-01-08 ENCOUNTER — Telehealth: Payer: Self-pay | Admitting: Family Medicine

## 2014-01-08 ENCOUNTER — Encounter: Payer: Self-pay | Admitting: Family Medicine

## 2014-01-08 NOTE — Telephone Encounter (Signed)
Patient phone not on. Sending her a letter about appointment.

## 2014-01-31 ENCOUNTER — Encounter: Payer: Self-pay | Admitting: Family Medicine

## 2014-01-31 ENCOUNTER — Encounter (INDEPENDENT_AMBULATORY_CARE_PROVIDER_SITE_OTHER): Payer: Self-pay | Admitting: Family Medicine

## 2014-01-31 NOTE — Progress Notes (Signed)
This encounter was created in error - please disregard.

## 2014-03-19 ENCOUNTER — Encounter (HOSPITAL_COMMUNITY): Payer: Self-pay | Admitting: Emergency Medicine

## 2014-03-19 ENCOUNTER — Emergency Department (INDEPENDENT_AMBULATORY_CARE_PROVIDER_SITE_OTHER)
Admission: EM | Admit: 2014-03-19 | Discharge: 2014-03-19 | Disposition: A | Discharge status: Home / Self Care | Payer: Self-pay | Source: Home / Self Care

## 2014-03-19 DIAGNOSIS — K047 Periapical abscess without sinus: Secondary | ICD-10-CM

## 2014-03-19 MED ORDER — DICLOFENAC POTASSIUM 50 MG PO TABS: 50.0000 mg | ORAL_TABLET | Freq: Three times a day (TID) | ORAL | Status: DC

## 2014-03-19 MED ORDER — CLINDAMYCIN HCL 300 MG PO CAPS: 300.0000 mg | ORAL_CAPSULE | Freq: Three times a day (TID) | ORAL | Status: DC

## 2014-03-19 NOTE — ED Provider Notes (Signed)
CSN: [id number]     Arrival date & time 03/19/14  1234 History   None    Chief Complaint  Patient presents with  . Dental Pain   (Consider location/radiation/quality/duration/timing/severity/associated sxs/prior Treatment) Patient is a 33 y.o. female presenting with tooth pain. The history is provided by the patient.  Dental Pain Location:  Upper Upper teeth location:  5/RU 1st bicuspid Quality:  Throbbing Severity:  Moderate Onset quality:  Gradual Duration:  2 days Progression:  Worsening Chronicity:  Recurrent Context: poor dentition   Relieved by:  None tried Worsened by:  Nothing tried Associated symptoms: facial pain and facial swelling   Associated symptoms: no fever   Risk factors: lack of dental care, periodontal disease and smoking     Past Medical History  Diagnosis Date  . Medical history non-contributory    Past Surgical History  Procedure Laterality Date  . No past surgeries     History reviewed. No pertinent family history. History  Substance Use Topics  . Smoking status: Current Every Day Smoker -- 0.25 packs/day for 5 years    Types: Cigarettes  . Smokeless tobacco: Never Used  . Alcohol Use: Yes     Comment: occaisionally   OB History   Grav Para Term Preterm Abortions TAB SAB Ect Mult Living   3 2 2  1  1   2      Review of Systems  Constitutional: Negative.  Negative for fever.  HENT: Positive for dental problem, ear pain and facial swelling. Negative for ear discharge.     Allergies  Review of patient's allergies indicates no known allergies.  Home Medications   Prior to Admission medications   Medication Sig Start Date End Date Taking? Authorizing Provider  clindamycin (CLEOCIN) 300 MG capsule Take 1 capsule (300 mg total) by mouth 3 (three) times daily. 03/19/14   Billy Fischer, MD  diclofenac (CATAFLAM) 50 MG tablet Take 1 tablet (50 mg total) by mouth 3 (three) times daily. For toothache 03/19/14   Billy Fischer, MD   BP 133/91   Pulse 84  Temp(Src) 98.6 F (37 C) (Oral)  Resp 16  SpO2 100%  LMP 03/07/2014 Physical Exam  Nursing note and vitals reviewed. Constitutional: She is oriented to person, place, and time. She appears well-developed and well-nourished.  HENT:  Head: Normocephalic.  Right Ear: External ear normal.  Left Ear: External ear normal.  Mouth/Throat: Uvula is midline. Abnormal dentition. Dental abscesses and dental caries present.    Eyes: Conjunctivae are normal. Pupils are equal, round, and reactive to light.  Neck: Normal range of motion. Neck supple.  Neurological: She is alert and oriented to person, place, and time.  Skin: Skin is warm and dry.    ED Course  Procedures (including critical care time) Labs Review Labs Reviewed - No data to display  Imaging Review No results found.   MDM   1. Dental abscess        Billy Fischer, MD 03/19/14 [phone]

## 2014-03-19 NOTE — ED Notes (Signed)
Pt  Reports    r   Side  Face   painfull  And  Swollen  Pain     Is    Radiating  To  r  Ear  Area

## 2014-03-19 NOTE — Discharge Instructions (Signed)
Take medicine as prescribed, see your dentist as soon as possible °

## 2014-03-21 ENCOUNTER — Emergency Department (INDEPENDENT_AMBULATORY_CARE_PROVIDER_SITE_OTHER)
Admission: EM | Admit: 2014-03-21 | Discharge: 2014-03-21 | Disposition: A | Discharge status: Home / Self Care | Payer: Self-pay | Source: Home / Self Care | Attending: Family Medicine | Admitting: Family Medicine

## 2014-03-21 ENCOUNTER — Encounter (HOSPITAL_COMMUNITY): Payer: Self-pay | Admitting: Emergency Medicine

## 2014-03-21 DIAGNOSIS — K044 Acute apical periodontitis of pulpal origin: Secondary | ICD-10-CM

## 2014-03-21 DIAGNOSIS — K047 Periapical abscess without sinus: Secondary | ICD-10-CM

## 2014-03-21 MED ORDER — TRAMADOL HCL 50 MG PO TABS: 50.0000 mg | ORAL_TABLET | Freq: Four times a day (QID) | ORAL | Status: DC | PRN

## 2014-03-21 NOTE — Discharge Instructions (Signed)
Thank you for coming in today. Continue the clindamycin.  Use tramadol for severe pain.  Follow up with a dentist ASAP.   Please call Dr. Donn Pierini office [phone] or cell [phone] [address], [place] for tooth removal $200 includes exam, Xray, and extraction and follow up visit.  Bring list of current medications with you.   Please call [facility] at [phone] to get the details to get your tooth pulled.   [facility] or [facility].  FREE August 28-29th  [place], [place], [place] [address]  Wristbands will be handed out at 6 PM on Thursday, August 27th.    Facial Infection You have an infection of your face. This requires special attention to help prevent serious problems. Infections in facial wounds can cause poor healing and scars. They can also spread to deeper tissues, especially around the eye. Wound and dental infections can lead to sinusitis, infection of the eye socket, and even meningitis. Permanent damage to the skin, eye, and nervous system may result if facial infections are not treated properly. With severe infections, hospital care for IV antibiotic injections may be needed if they don't respond to oral antibiotics. Antibiotics must be taken for the full course to insure the infection is eliminated. If the infection came from a bad tooth, it may have to be extracted when the infection is under control. Warm compresses may be applied to reduce skin irritation and remove drainage. You might need a tetanus shot now if:  You cannot remember when your last tetanus shot was.  You have never had a tetanus shot.  The object that caused your wound was dirty. If you need a tetanus shot, and you decide not to get one, there is a rare chance of getting tetanus. Sickness from tetanus can be serious. If you got a tetanus shot, your arm may swell, get red and warm to the touch at  the shot site. This is common and not a problem. SEEK IMMEDIATE MEDICAL CARE IF:   You have increased swelling, redness, or trouble breathing.  You have a severe headache, dizziness, nausea, or vomiting.  You develop problems with your eyesight.  You have a fever. Document Released: 11/19/2004 Document Revised: 01/04/2012 Document Reviewed: 10/12/2005 [facility] Patient Information 2014 [place].

## 2014-03-21 NOTE — ED Provider Notes (Signed)
Judith Lowe is a 33 y.o. female who presents to Urgent Care today for dental infection. Patient notes worsening upper right tooth pain over the past several weeks. The pain escalated 2 or 3 days ago. She was seen at the urgent care 2 days ago and was diagnosed with a dental assess. She's been treated with clindamycin and diclofenac. She is taking the clindamycin however the pain persists. She is attempting to schedule with a dentist however does not have a dentist or dental insurance. She denies any fevers or chills nausea vomiting or diarrhea. The pain is severe and worsened with chewing.    Past Medical History  Diagnosis Date  . Medical history non-contributory    History  Substance Use Topics  . Smoking status: Current Every Day Smoker -- 0.25 packs/day for 5 years    Types: Cigarettes  . Smokeless tobacco: Never Used  . Alcohol Use: Yes     Comment: occaisionally   ROS as above Medications: No current facility-administered medications for this encounter.   Current Outpatient Prescriptions  Medication Sig Dispense Refill  . clindamycin (CLEOCIN) 300 MG capsule Take 1 capsule (300 mg total) by mouth 3 (three) times daily.  21 capsule  0  . traMADol (ULTRAM) 50 MG tablet Take 1 tablet (50 mg total) by mouth every 6 (six) hours as needed.  15 tablet  0    Exam:  BP 134/84  Pulse 79  Temp(Src) 98.4 F (36.9 C) (Oral)  Resp 18  SpO2 100%  LMP 03/17/2014  Breastfeeding? No Gen: Well NAD HEENT: EOMI,  MMM diffuse upper lip swelling on the right side. The right upper bicuspid is eroded to  gumline with erythema and an area of fluctuance. Tender to touch. Lungs: Normal work of breathing. CTABL Heart: RRR no MRG Abd: NABS, Soft. NT, ND Exts: Brisk capillary refill, warm and well perfused.   Dental injection: Consent obtained Topical numbing medicine applied to the base of the tooth 1.8 mL of Marcaine and epinephrine were injected into the base of the tooth at the junction of the  gum and cheek achieving good anesthesia. Patient tolerated procedure well.  The area of fluctuance was sharply incised with an 11 blade scalpel .no pus was drained .    No results found for this or any previous visit (from the past 24 hour(s)). No results found.  Assessment and Plan: 33 y.o. female with  dental infection. No pus drained from possible abscess. Patient felt much better following block.  Plan to continue clindamycin and treat pain with tramadol. Refer to dentist.   Discussed warning signs or symptoms. Please see discharge instructions. Patient expresses understanding.    Gregor Hams, MD 03/21/14 [phone]

## 2014-03-21 NOTE — ED Notes (Addendum)
Pt c/o dental pain and swelling on right side Seen here on Monday; given Clindamycin and Diclofenac w/no relief.  Awaiting call from dentist Alert w/no signs of acute distress.

## 2014-08-27 ENCOUNTER — Encounter (HOSPITAL_COMMUNITY): Payer: Self-pay | Admitting: Emergency Medicine

## 2016-04-17 ENCOUNTER — Encounter (HOSPITAL_COMMUNITY): Payer: Self-pay | Admitting: *Deleted

## 2016-04-17 ENCOUNTER — Emergency Department (HOSPITAL_COMMUNITY)
Admission: EM | Admit: 2016-04-17 | Discharge: 2016-04-17 | Disposition: A | Discharge status: Home / Self Care | Payer: Self-pay | Attending: Emergency Medicine | Admitting: Emergency Medicine

## 2016-04-17 DIAGNOSIS — K0889 Other specified disorders of teeth and supporting structures: Secondary | ICD-10-CM | POA: Insufficient documentation

## 2016-04-17 DIAGNOSIS — R590 Localized enlarged lymph nodes: Secondary | ICD-10-CM | POA: Insufficient documentation

## 2016-04-17 DIAGNOSIS — Z79899 Other long term (current) drug therapy: Secondary | ICD-10-CM | POA: Insufficient documentation

## 2016-04-17 DIAGNOSIS — F17210 Nicotine dependence, cigarettes, uncomplicated: Secondary | ICD-10-CM | POA: Insufficient documentation

## 2016-04-17 MED ORDER — LIDOCAINE VISCOUS 2 % MT SOLN: 15.0000 mL | OROMUCOSAL | Status: DC | PRN

## 2016-04-17 MED ORDER — ACETAMINOPHEN 325 MG PO TABS
650.0000 mg | ORAL_TABLET | Freq: Once | ORAL | Status: AC
Start: 2016-04-17 — End: 2016-04-17
  Administered 2016-04-17: 650 mg via ORAL
  Filled 2016-04-17: qty 2

## 2016-04-17 MED ORDER — AMOXICILLIN-POT CLAVULANATE 875-125 MG PO TABS: 1.0000 | ORAL_TABLET | Freq: Two times a day (BID) | ORAL | Status: DC

## 2016-04-17 NOTE — ED Notes (Signed)
The pt has had a  Toothache for 3-4 days she woke up this am with lt face swelling and a throbbing in her lt face

## 2016-04-17 NOTE — ED Provider Notes (Signed)
CSN: [id number]     Arrival date & time 04/17/16  1339 History  By signing my name below, I, Nicole Kindred, attest that this documentation has been prepared under the direction and in the presence of Frederica Kuster, PA-C.  Electronically Signed: Nicole Kindred, ED Scribe 04/17/2016 at 6:42 PM.   No chief complaint on file.   The history is provided by the patient. No language interpreter was used.   HPI Comments: Judith Lowe is a 35 y.o. female who presents to the Emergency Department complaining of gradual onset, throbbing, 10/10, left lower dental pain, beginning last night. Pt believes she may have a cracked tooth in the area. Pt reports associated left-sided facial pain beginning this morning. She also complains of diaphoresis, chills, and cervical lymphadenopathy. Pt has hx of dental abscess and multiple tooth extractions. No other associated symptoms noted. Her dental pain is worse with eating. No other worsening or alleviating factors noted. Pt denies fever, difficulty swallowing, difficulty breathing, nausea, emesis, diarrhea, congestion, sore throat, sinus pressure, cough, or any other pertinent symptoms. She has tried tylenol and ibuprofen with minimal relief of sxs.  Past Medical History  Diagnosis Date  . Medical history non-contributory    Past Surgical History  Procedure Laterality Date  . No past surgeries     No family history on file. Social History  Substance Use Topics  . Smoking status: Current Every Day Smoker -- 0.25 packs/day for 5 years    Types: Cigarettes  . Smokeless tobacco: Never Used  . Alcohol Use: Yes     Comment: occaisionally   OB History    Gravida Para Term Preterm AB TAB SAB Ectopic Multiple Living   3 2 2  1  1   2      Review of Systems  Constitutional: Positive for chills. Negative for fever.  HENT: Positive for dental problem and facial swelling. Negative for congestion, sinus pressure, sore throat and trouble swallowing.    Respiratory: Negative for cough.        No difficulty breathing.  Gastrointestinal: Negative for nausea, vomiting and diarrhea.  Musculoskeletal:       Cervical lymphadenopathy noted.     Allergies  Review of patient's allergies indicates no known allergies.  Home Medications   Prior to Admission medications   Medication Sig Start Date End Date Taking? Authorizing Provider  amoxicillin-clavulanate (AUGMENTIN) 875-125 MG tablet Take 1 tablet by mouth 2 (two) times daily. 04/17/16   Roxanna Mew, PA-C  clindamycin (CLEOCIN) 300 MG capsule Take 1 capsule (300 mg total) by mouth 3 (three) times daily. 03/19/14   Billy Fischer, MD  lidocaine (XYLOCAINE) 2 % solution Use as directed 15 mLs in the mouth or throat as needed for mouth pain. Swish and spit 58ml every 4hrs as needed for pain. 04/17/16   Roxanna Mew, PA-C  traMADol (ULTRAM) 50 MG tablet Take 1 tablet (50 mg total) by mouth every 6 (six) hours as needed. 03/21/14   Gregor Hams, MD   BP 127/91 mmHg  Pulse 80  Temp(Src) 98.1 F (36.7 C) (Oral)  Resp 18  Ht 5\' 9"  (1.753 m)  Wt 162 lb 7 oz (73.681 kg)  BMI 23.98 kg/m2  SpO2 100%  LMP 04/17/2016 Physical Exam  Constitutional: She appears well-developed and well-nourished. No distress.  HENT:  Head: Normocephalic and atraumatic.  Nose: Right sinus exhibits no maxillary sinus tenderness and no frontal sinus tenderness. Left sinus exhibits maxillary sinus tenderness. Left sinus exhibits  no frontal sinus tenderness.  Mouth/Throat: Uvula is midline, oropharynx is clear and moist and mucous membranes are normal. No oral lesions. No trismus in the jaw. Abnormal dentition. Dental caries present. No uvula swelling. No oropharyngeal exudate, posterior oropharyngeal edema, posterior oropharyngeal erythema or tonsillar abscesses.  Swelling noted in left lower jaw and submandibular. Tissue is soft. TTP of left maxillary sinus, jaw, and submandibular space.  Exquisitely TTP to left  lower molars with appreciable decay of teeth. No overt erythema or drainage appreciated on exam. No tongue or sublingual swelling noted.    Eyes: Conjunctivae are normal. No scleral icterus.  Neck: Normal range of motion, full passive range of motion without pain and phonation normal. No rigidity. Normal range of motion present.  Cardiovascular: Normal rate.   Pulmonary/Chest: Effort normal. No respiratory distress.  Lymphadenopathy:    She has cervical adenopathy.  TTP of cervical lymph nodes. Mild swelling noted.   Neurological: She is alert.  Skin: Skin is warm and dry. She is not diaphoretic.  Psychiatric: She has a normal mood and affect. Her behavior is normal.    ED Course  Procedures (including critical care time) DIAGNOSTIC STUDIES: Oxygen Saturation is 100% on RA, normal by my interpretation.    COORDINATION OF CARE: 3:34 PM Discussed treatment plan with pt at bedside and pt agreed to plan.  Labs Review Labs Reviewed - No data to display  Imaging Review No results found.   EKG Interpretation None      MDM   Final diagnoses:  Pain, dental   Patient is afebrile and non-toxic appearing. Her vital signs are stable. Physical exam remarkable for appreciable swelling of left lower jaw at angle of mandible and submandibular space. She has exquisite TTP of left lower molars. No trismus. Patient is managing her oral secretions. Suspect dental abscess. Rx ABX and symptomatic management with lidocaine mouthwash and ice to affected area. Provided resources for establishing a dentist for further evaluation. Return precautions discussed. Patient voiced understanding and is agreeable. Discussed patient with Dr. Regenia Skeeter, who also saw patient, agrees with plan.   I personally performed the services described in this documentation, which was scribed in my presence. The recorded information has been reviewed and is accurate.    Roxanna Mew, PA-C 04/17/16 1848  Sherwood Gambler, MD 04/18/16 [phone]

## 2016-04-17 NOTE — Discharge Instructions (Signed)
Read the information below.  Apply ice to affected area. Take 400mg  ibuprofen or 650mg  tylenol every 6hrs as needed for pain control. I have prescribed lidocaine mouth wash. Swish and spit every 4hrs as needed. Take ABX as prescribed.  It is important that you see a dentist for further evaluation and management. I have provided dental resources for below. You can try contacting your previous dentist for re-evaluation.  Use the prescribed medication as directed.  Please discuss all new medications with your pharmacist.   You may return to the Emergency Department at any time for worsening condition or any new symptoms that concern you. Return to ED if you are unable to open your mouth, manage your oral secretions (e.g. Drooling), have trouble swallowing, have trouble breathing, develop a fever, chills, or night sweats.    [facility] [employer] is a great source of information about community services available.  Access by dialing 2-1-1 from anywhere in [place], or by website -  Cust[employer]ianSupp[employer].fi.   Other Local Resources (Updated 10/2015)  Dental  Care   Services    Phone Number and Address  Cost  [facility] For children 9 - 43 years of age:   Cleaning  Tooth brushing/flossing instruction  Sealants, fillings, crowns  Extractions  Emergency treatment  [phone] [address], [place] [address] Charges based on family income.  Medicaid and some insurance plans accepted.     [facility] - [facility], fillings, crowns  Extractions  Emergency treatment [phone] [address], [place]  Pregnant women 84 years of age or older with a Medicaid card  [facility] - [place]  Cleaning  Sealants, fillings, crowns  Extractions  Emergency treatment [phone] [place], [place]  Pregnant women 65 years of age or older with a Medicaid card  [facility] For children 24 - 33 years of age:   Cleaning  Tooth brushing/flossing instruction  Sealants, fillings, crowns  Extractions  Emergency treatment Limited orthodontic services for patients with Medicaid [phone] [address], [place] [address] Medicaid and [facility] Health Choice cover for children up to age 67 and pregnant women.  Parents of children up to age 109 without Medicaid pay a reduced fee at time of service.  [facility] For children 84 - 62 years of age:   Cleaning  Tooth brushing/flossing instruction  Sealants, fillings, crowns  Extractions  Emergency treatment Limited orthodontic services for patients with Medicaid [phone] [place], [place].  Medicaid and [place] Health Choice cover for children up to age 41 and pregnant women.  Parents of children up to age 44 without Medicaid pay a reduced fee.  [facility]  Sealants, fillings, crowns  Extractions  Hours: Tuesdays and Thursdays, 4:15 - 8 pm [phone] [address], [place], [place] [address] Services free of charge to [facility] residents ages 18-64 who do not have health insurance, Medicare, [place], or [place] benefits and fall within [place] care in addition to primary medical care, nutritional counseling, and pharmacy:  Cleaning  Sealants, fillings, crowns  Extractions  [place], [place], [place], [facility] [place], [place], [facility], [place], [place] [facility] [place], [place], [place], most insurance.  Also provides services available to all with fees adjusted based on ability to pay.    [facility]  Cleaning  Tooth brushing/flossing instruction  Sealants, fillings, crowns  Extractions  Emergency treatment Hours: Tuesdays, Thursdays, and Fridays from 8 am to 5 pm by appointment only. [phone] [address] [place], [place] [address] [facility] residents with Medicaid (depending on eligibility) and children with [facility] Health Choice - call for more information.  [facility]  Extractions only  Hours: 2nd and 4th Thursday of each month from 6:30 am - 9 am.   [phone] ext. [place],  [phone] Ages 45 and older only.  Patients are seen on a first come, first served basis.  [employer]  [employer]  Extractions  Orthodontics  Endodontics  Implants/Crowns/Bridges  Complete and partial dentures [phone] [place],  Patients must complete an application for services.  There is often a waiting list.

## 2016-04-17 NOTE — ED Notes (Signed)
Pt has swelling to L face onset yesterday, pt reports cracked L bottom tooth, pt denies fever & chills, n/v/d, A&O x4, no drainage from the tooth

## 2017-04-09 ENCOUNTER — Emergency Department (HOSPITAL_COMMUNITY)
Admission: EM | Admit: 2017-04-09 | Discharge: 2017-04-09 | Discharge status: Against Medical Advice | Payer: Self-pay | Attending: Emergency Medicine | Admitting: Emergency Medicine

## 2017-04-09 ENCOUNTER — Encounter (HOSPITAL_COMMUNITY): Payer: Self-pay | Admitting: Emergency Medicine

## 2017-04-09 DIAGNOSIS — H10023 Other mucopurulent conjunctivitis, bilateral: Secondary | ICD-10-CM | POA: Insufficient documentation

## 2017-04-09 DIAGNOSIS — Z5321 Procedure and treatment not carried out due to patient leaving prior to being seen by health care provider: Secondary | ICD-10-CM | POA: Insufficient documentation

## 2017-04-09 NOTE — ED Notes (Signed)
Pt stated she could not wait any longer b/c she had to go to work. Pt has left.

## 2017-04-09 NOTE — ED Triage Notes (Addendum)
Pt reports that last night her eyes were draining, this morning she woke up and her left eye has been incredibly "itchy" and "has a film on it."  Pt reports she just moved to a new area w/ lots of trees and bushes.

## 2018-03-04 ENCOUNTER — Other Ambulatory Visit: Payer: Self-pay

## 2018-03-04 ENCOUNTER — Encounter (HOSPITAL_COMMUNITY): Payer: Self-pay | Admitting: Emergency Medicine

## 2018-03-04 ENCOUNTER — Emergency Department (HOSPITAL_COMMUNITY)
Admission: EM | Admit: 2018-03-04 | Discharge: 2018-03-05 | Disposition: A | Discharge status: Home / Self Care | Payer: Self-pay | Attending: Emergency Medicine | Admitting: Emergency Medicine

## 2018-03-04 DIAGNOSIS — R51 Headache: Secondary | ICD-10-CM | POA: Insufficient documentation

## 2018-03-04 DIAGNOSIS — Z5321 Procedure and treatment not carried out due to patient leaving prior to being seen by health care provider: Secondary | ICD-10-CM | POA: Insufficient documentation

## 2018-03-04 HISTORY — DX: Alcohol abuse, uncomplicated: F10.10

## 2018-03-04 HISTORY — DX: Other psychoactive substance use, unspecified, uncomplicated: F19.90

## 2018-03-04 NOTE — ED Triage Notes (Signed)
C/o recurrent headache over the past 2 weeks.  States it has been constant since last night.  Also reports nausea and light sensitivity.   No arm drift.  No neuro deficits noted on triage exam.

## 2018-03-05 NOTE — ED Notes (Signed)
No answer for vital recheck X1

## 2018-03-05 NOTE — ED Notes (Signed)
Called x 3 NO answer 

## 2018-03-09 ENCOUNTER — Encounter (HOSPITAL_COMMUNITY): Payer: Self-pay

## 2018-03-09 ENCOUNTER — Emergency Department (HOSPITAL_COMMUNITY)
Admission: EM | Admit: 2018-03-09 | Discharge: 2018-03-09 | Disposition: A | Discharge status: Home / Self Care | Payer: Self-pay | Attending: Emergency Medicine | Admitting: Emergency Medicine

## 2018-03-09 ENCOUNTER — Other Ambulatory Visit: Payer: Self-pay

## 2018-03-09 DIAGNOSIS — R51 Headache: Secondary | ICD-10-CM | POA: Insufficient documentation

## 2018-03-09 DIAGNOSIS — F17210 Nicotine dependence, cigarettes, uncomplicated: Secondary | ICD-10-CM | POA: Insufficient documentation

## 2018-03-09 DIAGNOSIS — R519 Headache, unspecified: Secondary | ICD-10-CM

## 2018-03-09 LAB — POC URINE PREG, ED: Preg Test, Ur: NEGATIVE

## 2018-03-09 MED ORDER — METOCLOPRAMIDE HCL 5 MG/ML IJ SOLN
10.0000 mg | Freq: Once | INTRAMUSCULAR | Status: AC
  Administered 2018-03-09: 10 mg via INTRAVENOUS
  Filled 2018-03-09: qty 2

## 2018-03-09 MED ORDER — DIPHENHYDRAMINE HCL 50 MG/ML IJ SOLN
25.0000 mg | Freq: Once | INTRAMUSCULAR | Status: AC
  Administered 2018-03-09: 25 mg via INTRAVENOUS
  Filled 2018-03-09: qty 1

## 2018-03-09 MED ORDER — SODIUM CHLORIDE 0.9 % IV BOLUS
1000.0000 mL | Freq: Once | INTRAVENOUS | Status: AC
  Administered 2018-03-09: 1000 mL via INTRAVENOUS

## 2018-03-09 MED ORDER — BUTALBITAL-APAP-CAFFEINE 50-325-40 MG PO TABS: 1.0000 | ORAL_TABLET | Freq: Four times a day (QID) | ORAL | 0 refills | Status: AC | PRN

## 2018-03-09 MED ORDER — KETOROLAC TROMETHAMINE 30 MG/ML IJ SOLN
30.0000 mg | Freq: Once | INTRAMUSCULAR | Status: AC
  Administered 2018-03-09: 30 mg via INTRAVENOUS
  Filled 2018-03-09: qty 1

## 2018-03-09 NOTE — ED Provider Notes (Signed)
[facility] EMERGENCY DEPARTMENT Provider Note   CSN: [id number] Arrival date & time: 03/09/18  5631     History   Chief Complaint Chief Complaint  Patient presents with  . Headache    HPI Judith Lowe is a 37 y.o. female.  HPI   37 year old female with history of polysubstance abuse including alcohol and drugs presenting for evaluation of headache.  Patient report for the past week she has had intermittent recurrent headache.  She describes her headache as a pressure sharp sensation to the crown of headache with pressure behind both eyes.  Pain is waxing waning sometimes lasting for the entire day.  Last episode of pain was last night, rated as 6 out of 10 which is increased to 8 out of 10.  She endorses some chills.  She also endorsed nausea without vomiting.  Endorsed light and sound sensitivity.  She denies any associated fever, diplopia, spots in her vision, URI symptoms, neck stiffness, focal numbness or weakness, or rash.  Aggravating factor including alcohol use of cocaine use, last use was several days prior.  She has had this type of headache in the past.  No prior history of stroke.  No history of IV drug use.  She has been taking ibuprofen without adequate relief.   Past Medical History:  Diagnosis Date  . Drug use   . ETOH abuse   . Medical history non-contributory     There are no active problems to display for this patient.   Past Surgical History:  Procedure Laterality Date  . NO PAST SURGERIES       OB History    Gravida  3   Para  2   Term  2   Preterm      AB  1   Living  2     SAB  1   TAB      Ectopic      Multiple      Live Births  2            Home Medications    Prior to Admission medications   Medication Sig Start Date End Date Taking? Authorizing Provider  amoxicillin-clavulanate (AUGMENTIN) 875-125 MG tablet Take 1 tablet by mouth 2 (two) times daily. 04/17/16   Frederica Kuster, PA-C  clindamycin  (CLEOCIN) 300 MG capsule Take 1 capsule (300 mg total) by mouth 3 (three) times daily. 03/19/14   Billy Fischer, MD  lidocaine (XYLOCAINE) 2 % solution Use as directed 15 mLs in the mouth or throat as needed for mouth pain. Swish and spit 37ml every 4hrs as needed for pain. 04/17/16   Frederica Kuster, PA-C  traMADol (ULTRAM) 50 MG tablet Take 1 tablet (50 mg total) by mouth every 6 (six) hours as needed. 03/21/14   Gregor Hams, MD    Family History History reviewed. No pertinent family history.  Social History Social History   Tobacco Use  . Smoking status: Current Every Day Smoker    Packs/day: 0.25    Years: 5.00    Pack years: 1.25    Types: Cigarettes  . Smokeless tobacco: Never Used  Substance Use Topics  . Alcohol use: Yes    Comment: occaisionally  . Drug use: Yes    Types: Cocaine, Marijuana     Allergies   Patient has no known allergies.   Review of Systems Review of Systems  All other systems reviewed and are negative.    Physical Exam  Updated Vital Signs BP (!) 142/94 (BP Location: Right Arm)   Pulse (!) 102   Temp 98.3 F (36.8 C) (Oral)   Resp 16   LMP 01/26/2018 (Within Days)   SpO2 100%   Physical Exam  Constitutional: She is oriented to person, place, and time. She appears well-developed and well-nourished. No distress.  HENT:  Head: Normocephalic and atraumatic.  Sensitivity to the scalp on gentle palpation without any overlying skin changes or hair changes.  No crepitus.  Eyes: Pupils are equal, round, and reactive to light. Conjunctivae and EOM are normal.  Neck: Normal range of motion. Neck supple.  No nuchal rigidity  Cardiovascular: Normal rate and regular rhythm.  Abdominal: Soft. Bowel sounds are normal.  Neurological: She is alert and oriented to person, place, and time. She has normal strength. GCS eye subscore is 4. GCS verbal subscore is 5. GCS motor subscore is 6.  Neurologic exam:  Speech clear, pupils equal round reactive to  light, extraocular movements intact  Normal peripheral visual fields Cranial nerves III through XII normal including no facial droop Follows commands, moves all extremities x4, normal strength to bilateral upper and lower extremities at all major muscle groups including grip Sensation normal to light touch  Coordination intact, no limb ataxia, finger-nose-finger normal Rapid alternating movements normal No pronator drift Gait normal   Skin: No rash noted.  Psychiatric: She has a normal mood and affect.  Nursing note and vitals reviewed.    ED Treatments / Results  Labs (all labs ordered are listed, but only abnormal results are displayed) Labs Reviewed  POC URINE PREG, ED    EKG None  Radiology No results found.  Procedures Procedures (including critical care time)  Medications Ordered in ED Medications - No data to display   Initial Impression / Assessment and Plan / ED Course  I have reviewed the triage vital signs and the nursing notes.  Pertinent labs & imaging results that were available during my care of the patient were reviewed by me and considered in my medical decision making (see chart for details).     BP 123/79   Pulse 74   Temp 98.3 F (36.8 C) (Oral)   Resp 16   LMP 01/26/2018 (Within Days)   SpO2 100%    Final Clinical Impressions(s) / ED Diagnoses   Final diagnoses:  Bad headache    ED Discharge Orders        Ordered    butalbital-acetaminophen-caffeine (FIORICET, ESGIC) 50-325-40 MG tablet  Every 6 hours PRN     03/09/18 1231     9:08 AM Patient here with recurrent headache, usually brought on by alcohol or cocaine use.  She denies IV drug use, or active cancer.  She is not febrile.  She does not have any infectious symptoms and is well-appearing with normal phonation and normal baseline mental status.  There is no focal neuro deficit on exam.  She does admits to having unprotected sex, last menstrual period was April 13.  Will check  pregnancy test.  If negative, will give migraine cocktail.  Strong encouragement to avoid alcohol and cocaine use in which patient agrees.  12:32 PM Patient report much improvement after receiving migraine cocktail.  She is stable for discharge.  Patient discharged home with Fioricet, recommend outpatient follow-up with neurology as needed for her recurrent headache.  Return precautions discussed.   Domenic Moras, PA-C 03/09/18 [place], MD 03/10/18 [phone]

## 2018-03-09 NOTE — ED Notes (Signed)
Pt endorses pain to top of head, pt states that it has been present on and off for about 2 weeks, started after she started using cocaine again. Pt states that she took 2 aleve earlier today and it helped "for a little bit".

## 2018-03-09 NOTE — ED Notes (Signed)
Walked patient to the bathroom patient did well patient is now back in bed on the monitor call bell in reach 

## 2018-03-09 NOTE — ED Triage Notes (Signed)
Pt states recurrent headache X2 weeks. She states her head is sensitive to touch. She reports she recently had a relapse of cocaine use. She states headaches have been occurring since.

## 2019-08-18 ENCOUNTER — Other Ambulatory Visit: Payer: Self-pay

## 2019-08-18 ENCOUNTER — Emergency Department (HOSPITAL_COMMUNITY)
Admission: EM | Admit: 2019-08-18 | Discharge: 2019-08-19 | Disposition: A | Discharge status: Home / Self Care | Payer: Self-pay | Attending: Emergency Medicine | Admitting: Emergency Medicine

## 2019-08-18 DIAGNOSIS — D259 Leiomyoma of uterus, unspecified: Secondary | ICD-10-CM | POA: Insufficient documentation

## 2019-08-18 DIAGNOSIS — F17210 Nicotine dependence, cigarettes, uncomplicated: Secondary | ICD-10-CM | POA: Insufficient documentation

## 2019-08-18 DIAGNOSIS — D219 Benign neoplasm of connective and other soft tissue, unspecified: Secondary | ICD-10-CM

## 2019-08-18 LAB — COMPREHENSIVE METABOLIC PANEL
ALT: 15 U/L (ref 0–44)
AST: 19 U/L (ref 15–41)
Albumin: 4.2 g/dL (ref 3.5–5.0)
Alkaline Phosphatase: 44 U/L (ref 38–126)
Anion gap: 8 (ref 5–15)
BUN: 14 mg/dL (ref 6–20)
CO2: 28 mmol/L (ref 22–32)
Calcium: 9.1 mg/dL (ref 8.9–10.3)
Chloride: 104 mmol/L (ref 98–111)
Creatinine, Ser: 0.99 mg/dL (ref 0.44–1.00)
GFR calc Af Amer: >60 mL/min (ref >60)
GFR calc non Af Amer: >60 mL/min (ref >60)
Glucose, Bld: 66 mg/dL — ABNORMAL LOW (ref 70–99)
Potassium: 4.7 mmol/L (ref 3.5–5.1)
Sodium: 140 mmol/L (ref 135–145)
Total Bilirubin: 0.4 mg/dL (ref 0.3–1.2)
Total Protein: 7 g/dL (ref 6.5–8.1)

## 2019-08-18 LAB — CBC
HCT: 37.2 % (ref 36.0–46.0)
Hemoglobin: 11.9 g/dL — ABNORMAL LOW (ref 12.0–15.0)
MCH: 31.3 pg (ref 26.0–34.0)
MCHC: 32 g/dL (ref 30.0–36.0)
MCV: 97.9 fL (ref 80.0–100.0)
Platelets: 267 10*3/uL (ref 150–400)
RBC: 3.8 MIL/uL — ABNORMAL LOW (ref 3.87–5.11)
RDW: 14.5 % (ref 11.5–15.5)
WBC: 6.5 10*3/uL (ref 4.0–10.5)
nRBC: 0 % (ref 0.0–0.2)

## 2019-08-18 LAB — I-STAT BETA HCG BLOOD, ED (MC, WL, AP ONLY): I-stat hCG, quantitative: <5 m[IU]/mL (ref <5)

## 2019-08-18 LAB — LIPASE, BLOOD: Lipase: 27 U/L (ref 11–51)

## 2019-08-18 MED ORDER — MORPHINE SULFATE (PF) 4 MG/ML IV SOLN
4.0000 mg | Freq: Once | INTRAVENOUS | Status: AC
  Administered 2019-08-19: 4 mg via INTRAVENOUS
  Filled 2019-08-18: qty 1

## 2019-08-18 MED ORDER — ONDANSETRON HCL 4 MG/2ML IJ SOLN
4.0000 mg | Freq: Once | INTRAMUSCULAR | Status: AC
  Administered 2019-08-19: 4 mg via INTRAVENOUS
  Filled 2019-08-18: qty 2

## 2019-08-18 NOTE — ED Triage Notes (Addendum)
Pt comes in for lower abdominal/pelvic pain and 3 months of irregular periods. States she has been having menstrual like cramps on and off but with only spotting, no regular period since June 2020, with increased urinary frequency and pain. Pt states she had to leave work last night due to pain, has tried ibuprofen w/ no success.

## 2019-08-18 NOTE — ED Provider Notes (Signed)
[facility] EMERGENCY DEPARTMENT Provider Note   CSN: [id number] Arrival date & time: 08/18/19  1619     History   Chief Complaint Chief Complaint  Patient presents with   Abdominal Pain    HPI Judith Lowe is a 38 y.o. female.     Patient presents to the emergency department with a chief complaint of right lower abdominal pain.  She states that the pain started last night and has been significantly worsening.  She states that she has a sensation like she needs to have a bowel movement, but does not actually need to stool.  She denies any dysuria or hematuria.  She reports that she has had some irregular menstrual bleeding for the past month or 2.  She states that she has had some cramping sensation, but nothing like what she is experiencing in the right lower extremity needs abdomen now.  She reports associated nausea, but denies any vomiting or diarrhea.  Denies any fevers or chills.  Denies taking anything for symptoms.  The history is provided by the patient. No language interpreter was used.    Past Medical History:  Diagnosis Date   Drug use    ETOH abuse    Medical history non-contributory     There are no active problems to display for this patient.   Past Surgical History:  Procedure Laterality Date   NO PAST SURGERIES       OB History    Gravida  3   Para  2   Term  2   Preterm      AB  1   Living  2     SAB  1   TAB      Ectopic      Multiple      Live Births  2            Home Medications    Prior to Admission medications   Medication Sig Start Date End Date Taking? Authorizing Provider  Ibuprofen-diphenhydrAMINE Cit 200-38 MG TABS Take 2 tablets by mouth at bedtime as needed (sleep and pain).   Yes [provider]    Family History No family history on file.  Social History Social History   Tobacco Use   Smoking status: Current Every Day Smoker    Packs/day: 0.25    Years: 5.00    Pack  years: 1.25    Types: Cigarettes   Smokeless tobacco: Never Used  Substance Use Topics   Alcohol use: Yes    Comment: occaisionally   Drug use: Yes    Types: Cocaine, Marijuana     Allergies   Patient has no known allergies.   Review of Systems Review of Systems  All other systems reviewed and are negative.    Physical Exam Updated Vital Signs BP (!) 116/95 (BP Location: Left Arm)    Pulse 62    Temp 98.9 F (37.2 C) (Oral)    Resp 20    LMP 03/27/2019    SpO2 98%   Physical Exam Vitals signs and nursing note reviewed.  Constitutional:      General: She is not in acute distress.    Appearance: She is well-developed.  HENT:     Head: Normocephalic and atraumatic.  Eyes:     Conjunctiva/sclera: Conjunctivae normal.  Neck:     Musculoskeletal: Neck supple.  Cardiovascular:     Rate and Rhythm: Normal rate and regular rhythm.     Heart sounds: No  murmur.  Pulmonary:     Effort: Pulmonary effort is normal. No respiratory distress.     Breath sounds: Normal breath sounds.  Abdominal:     Palpations: Abdomen is soft.     Tenderness: There is abdominal tenderness.     Comments: Tenderness to palpation over McBurney's point, less tenderness in the pelvis, and more in the abdomen  Musculoskeletal: Normal range of motion.  Skin:    General: Skin is warm and dry.  Neurological:     Mental Status: She is alert and oriented to person, place, and time.  Psychiatric:        Mood and Affect: Mood normal.        Behavior: Behavior normal.      ED Treatments / Results  Labs (all labs ordered are listed, but only abnormal results are displayed) Labs Reviewed  COMPREHENSIVE METABOLIC PANEL - Abnormal; Notable for the following components:      Result Value   Glucose, Bld 66 (*)    All other components within normal limits  CBC - Abnormal; Notable for the following components:   RBC 3.80 (*)    Hemoglobin 11.9 (*)    All other components within normal limits    LIPASE, BLOOD  URINALYSIS, ROUTINE W REFLEX MICROSCOPIC  I-STAT BETA HCG BLOOD, ED ([facility], [facility], [facility] ONLY)    EKG None  Radiology Ct Abdomen Pelvis W Contrast  Result Date: 08/19/2019 CLINICAL DATA:  Abdominal pain, acute EXAM: CT ABDOMEN AND PELVIS WITH CONTRAST TECHNIQUE: Multidetector CT imaging of the abdomen and pelvis was performed using the standard protocol following bolus administration of intravenous contrast. CONTRAST:  146mL OMNIPAQUE IOHEXOL 300 MG/ML  SOLN COMPARISON:  None. FINDINGS: Lower chest: The visualized heart size within normal limits. No pericardial fluid/thickening. No hiatal hernia. The visualized portions of the lungs are clear. Hepatobiliary: The liver is normal in density without focal abnormality.The main portal vein is patent. No evidence of calcified gallstones, gallbladder wall thickening or biliary dilatation. Pancreas: Unremarkable. No pancreatic ductal dilatation or surrounding inflammatory changes. Spleen: Normal in size without focal abnormality. Adrenals/Urinary Tract: Both adrenal glands appear normal. The kidneys and collecting system appear normal without evidence of urinary tract calculus or hydronephrosis. Bladder is unremarkable. Stomach/Bowel: The stomach, small bowel, and colon are normal in appearance. No inflammatory changes, wall thickening, or obstructive findings.The appendix is normal. Vascular/Lymphatic: There are no enlarged mesenteric, retroperitoneal, or pelvic lymph nodes. No significant vascular findings are present. Reproductive: There is a lobular heterogeneous uterine fibroid seen in the posterior fundus. There appears to be a small amount of hyperdense fluid seen within the endometrial canal. Small amount of free fluid in the cul-de-sac. Other: No evidence of abdominal wall mass or hernia. Musculoskeletal: No acute or significant osseous findings. IMPRESSION: Probable intrauterine fibroids with a small amount of blood products in the endometrial  canal. No other acute intra-abdominal or pelvic pathology. Electronically Signed   By: Prudencio Pair M.D.   On: 08/19/2019 01:02    Procedures Procedures (including critical care time)  Medications Ordered in ED Medications  morphine 4 MG/ML injection 4 mg (has no administration in time range)  ondansetron (ZOFRAN) injection 4 mg (has no administration in time range)     Initial Impression / Assessment and Plan / ED Course  I have reviewed the triage vital signs and the nursing notes.  Pertinent labs & imaging results that were available during my care of the patient were reviewed by me and considered in my  medical decision making (see chart for details).       Patient with right lower quadrant abdominal pain that started last night and has been gradually worsening.  Picture is complicated given the patient has had menstrual cramp-like sensation for the past month or so with irregular menstrual flow.  However she reports that this pain is significantly different.  The tenderness is easily reproducible in the abdomen, and it seems superior to the ovaries.  More concerned for appendicitis at this time plan for ovarian etiology.  We will proceed with CT imaging.  CT negative for appendicitis.  It does show uterine fibroids.  I have encouraged patient to take NSAIDs for this and to follow-up with OB/GYN.  She understands and agrees with the plan.  She is stable ready for discharge.  Final Clinical Impressions(s) / ED Diagnoses   Final diagnoses:  Fibroids    ED Discharge Orders         Ordered    ibuprofen (ADVIL) 800 MG tablet  3 times daily     08/19/19 0144           Montine Circle, PA-C 08/19/19 0207    Orpah Greek, MD 08/19/19 [phone]

## 2019-08-19 ENCOUNTER — Emergency Department (HOSPITAL_COMMUNITY): Payer: Self-pay

## 2019-08-19 MED ORDER — IBUPROFEN 800 MG PO TABS: 800.0000 mg | ORAL_TABLET | Freq: Three times a day (TID) | ORAL | 0 refills | Status: DC

## 2019-08-19 MED ORDER — IOHEXOL 300 MG/ML  SOLN
100.0000 mL | Freq: Once | INTRAMUSCULAR | Status: AC | PRN
  Administered 2019-08-19: 100 mL via INTRAVENOUS

## 2019-08-19 NOTE — ED Notes (Signed)
Pt verbalized understanding of d/c instructions, follow up care, scripts and s/s requiring return to ed. Pt had no further questions

## 2019-08-21 ENCOUNTER — Telehealth: Payer: Self-pay | Admitting: Family Medicine

## 2019-08-21 NOTE — Telephone Encounter (Signed)
[facility] called stating the patient called in with no insurance to schedule an appointment as she was recently seen in ER. The patient established care in 2015. Scheduled the appointment and also mailing the patient a financial application to be turned in prior to the appointment.

## 2019-09-07 ENCOUNTER — Other Ambulatory Visit: Payer: Self-pay

## 2019-09-07 ENCOUNTER — Encounter: Payer: Self-pay | Admitting: Family Medicine

## 2019-09-07 ENCOUNTER — Ambulatory Visit (INDEPENDENT_AMBULATORY_CARE_PROVIDER_SITE_OTHER): Payer: Self-pay | Admitting: Family Medicine

## 2019-09-07 VITALS — BP 137/89 | HR 75 | Wt 143.7 lb

## 2019-09-07 DIAGNOSIS — R102 Pelvic and perineal pain: Secondary | ICD-10-CM

## 2019-09-07 DIAGNOSIS — R4589 Other symptoms and signs involving emotional state: Secondary | ICD-10-CM | POA: Insufficient documentation

## 2019-09-07 DIAGNOSIS — Z23 Encounter for immunization: Secondary | ICD-10-CM

## 2019-09-07 DIAGNOSIS — N911 Secondary amenorrhea: Secondary | ICD-10-CM | POA: Insufficient documentation

## 2019-09-07 MED ORDER — SERTRALINE HCL 50 MG PO TABS: 50.0000 mg | ORAL_TABLET | Freq: Every day | ORAL | 1 refills | Status: DC

## 2019-09-07 NOTE — Progress Notes (Signed)
   Subjective:    Patient ID: Judith Lowe is a 38 y.o. female presenting with Fibroids  on 09/07/2019  HPI: Has some pain. Seen in ED and notes to have fibroids. No cycle since 03/2019. No contraception. Has fibroid seen on CT. On Ibuprofen and pain is improved. Reports difficulty sleeping, poor appetite, poor mood. At times wishes she wasn't here.  Review of Systems  Constitutional: Negative for chills and fever.  Respiratory: Negative for shortness of breath.   Cardiovascular: Negative for chest pain.  Gastrointestinal: Negative for abdominal pain, nausea and vomiting.  Genitourinary: Negative for dysuria.  Skin: Negative for rash.  Psychiatric/Behavioral: Positive for dysphoric mood, sleep disturbance and suicidal ideas. Negative for self-injury.      Objective:    BP 137/89   Pulse 75   Wt 143 lb 11.2 oz (65.2 kg)   LMP 04/14/2019 (Exact Date)   BMI 21.22 kg/m  Physical Exam Constitutional:      General: She is not in acute distress.    Appearance: She is well-developed.  HENT:     Head: Normocephalic and atraumatic.  Eyes:     General: No scleral icterus. Neck:     Musculoskeletal: Neck supple.  Cardiovascular:     Rate and Rhythm: Normal rate.  Pulmonary:     Effort: Pulmonary effort is normal.  Abdominal:     Palpations: Abdomen is soft.  Skin:    General: Skin is warm and dry.  Neurological:     Mental Status: She is alert and oriented to person, place, and time.  Psychiatric:        Mood and Affect: Affect is blunt, flat and tearful.        Speech: Speech normal.        Behavior: Behavior normal. Behavior is cooperative.        Thought Content: Thought content normal. Thought content does not include homicidal ideation. Thought content does not include homicidal or suicidal plan.         Assessment & Plan:   Problem List Items Addressed This Visit      Unprioritized   Sad mood - Primary    Referral to [facility]. Contracts for safety. Start Zoloft.  Normal onset of action and plan discussed.      Relevant Orders   Ambulatory referral to [facility]   Secondary amenorrhea    Check labs--might desire pregnancy--      Relevant Orders   TSH   Prolactin   FSH   Pelvic pain    Fibroid noted--? Infarcted led to pain. Check pelvic sono      Relevant Orders   [place] [facility] Transvaginal    Other Visit Diagnoses    Need for influenza vaccination       Relevant Orders   Flu Vaccine QUAD 36+ mos IM    May need pap had one last year but ? abnl--info for free screen or return to [facility]. Total face-to-face time with patient: 20 minutes. Over 50% of encounter was spent on counseling and coordination of care. Return in about 4 weeks (around 10/05/2019) for a follow-up.  Donnamae Jude 09/07/2019 4:56 PM

## 2019-09-07 NOTE — Assessment & Plan Note (Addendum)
Check labs--might desire pregnancy--

## 2019-09-07 NOTE — Patient Instructions (Signed)
Major Depressive Disorder, Adult Major depressive disorder (MDD) is a mental health condition. MDD often makes you feel sad, hopeless, or helpless. MDD can also cause symptoms in your body. MDD can affect your:  Work.  School.  Relationships.  Other normal activities. MDD can range from mild to very bad. It may occur once (single episode MDD). It can also occur many times (recurrent MDD). The main symptoms of MDD often include:  Feeling sad, depressed, or irritable most of the time.  Loss of interest. MDD symptoms also include:  Sleeping too much or too little.  Eating too much or too little.  A change in your weight.  Feeling tired (fatigue) or having low energy.  Feeling worthless.  Feeling guilty.  Trouble making decisions.  Trouble thinking clearly.  Thoughts of suicide or harming others.  Feeling weak.  Feeling agitated.  Keeping yourself from being around other people (isolation). Follow these instructions at home: Activity  Do these things as told by your doctor: ? Go back to your normal activities. ? Exercise regularly. ? Spend time outdoors. Alcohol  Talk with your doctor about how alcohol can affect your antidepressant medicines.  Do not drink alcohol. Or, limit how much alcohol you drink. ? This means no more than 1 drink a day for nonpregnant women and 2 drinks a day for men. One drink equals one of these:  12 oz of beer.  5 oz of wine.  1 oz of hard liquor. General instructions  Take over-the-counter and prescription medicines only as told by your doctor.  Eat a healthy diet.  Get plenty of sleep.  Find activities that you enjoy. Make time to do them.  Think about joining a support group. Your doctor may be able to suggest a group for you.  Keep all follow-up visits as told by your doctor. This is important. Where to find more information:  [employer] on Mental Illness: ? www.nami.[place]: ? https://carter.com/  National Suicide Prevention Lifeline: ? 478-695-5169. This is free, 24-hour help. Contact a doctor if:  Your symptoms get worse.  You have new symptoms. Get help right away if:  You self-harm.  You see, hear, taste, smell, or feel things that are not present (hallucinate). If you ever feel like you may hurt yourself or others, or have thoughts about taking your own life, get help right away. You can go to your nearest emergency department or call:  Your local emergency services (911 in the [place]  A suicide crisis helpline, such as the National Suicide Prevention Lifeline: ? (779)693-7050. This is open 24 hours a day. This information is not intended to replace advice given to you by your health care provider. Make sure you discuss any questions you have with your health care provider. Document Released: 09/23/2015 Document Revised: 09/24/2017 Document Reviewed: 06/28/2016 Elsevier Patient Education  2020 [employer]. Secondary Amenorrhea  Secondary amenorrhea occurs when a female who was previously having menstrual periods has not had them for 3-6 months. A menstrual period is the monthly shedding of the lining of the uterus. Menstruation involves the passing of blood, tissue, fluid, and mucus through the vagina. The flow of blood usually occurs during 3-7 consecutive days each month. This condition has many causes. In many cases, treating the underlying cause will return menstrual periods back to a normal cycle. What are the causes? The most common cause of this condition is pregnancy. Other causes include:  Malnutrition.  Cirrhosis of  the liver.  Conditions of the blood.  Diabetes.  Epilepsy.  Chronic kidney disease.  Polycystic ovary disease.  Stress or anxiety.  A hormonal imbalance.  Ovarian failure.  Medicines.  Extreme obesity.  Cystic fibrosis.  Low body weight or drastic weight loss.  Early menopause.  Removal of  the ovaries or uterus.  Contraceptive pills, patches, or vaginal rings.  Cushing syndrome.  Thyroid problems. What increases the risk? You are more likely to develop this condition if:  You have a family history of this condition.  You have an eating disorder.  You do extreme athletic training.  You have a chronic disease.  You abuse substances such as alcohol or cigarettes. What are the signs or symptoms? The main symptom of this condition is a lack of menstrual periods for 3-6 months. How is this diagnosed? This condition may be diagnosed based on:  Your medical history.  A physical exam.  A pelvic exam to check for problems with your reproductive organs.  A procedure to examine the uterus.  A measurement of your body mass index (BMI).  Tests, such as: ? Blood tests that measure certain hormones in your body and rule out pregnancy. ? Urine tests. ? Imaging tests, such as an ultrasound, CT scan, or MRI. How is this treated? Treatment for this condition depends on the cause of the amenorrhea. It may involve:  Correcting dietary problems.  Treating underlying conditions.  Medicines.  Lifestyle changes.  Surgery. If the condition cannot be corrected, it is sometimes possible to trigger menstrual periods with medicines. Follow these instructions at home: Lifestyle  Maintain a healthy diet. Ask to meet with a registered dietitian for nutrition counseling and meal planning.  Maintain a healthy weight. Talk to your health care provider before trying any new diet or exercise plan.  Exercise at least 30 minutes 5 or more days each week. Exercising includes brisk walking, yard work, biking, running, swimming, and team sports like basketball and soccer. Ask your health care provider which exercises are safe for you.  Get enough sleep. Plan your sleep time to allow for 7-9 hours of sleep each night.  Learn to manage stress. Explore relaxation techniques such as  meditation, journaling, yoga, or tai chi. General instructions  Be aware of changes in your menstrual cycle. Keep a record of when you have your menstrual period. Note the date your period starts, how long it lasts, and any problems you experience.  Take over-the-counter and prescription medicines only as told by your health care provider.  Keep all follow-up visits as told by your health care provider. This is important. Contact a health care provider if:  Your periods do not return to normal after treatment. Summary  Secondary amenorrhea is when a female who was previously having menstrual periods has not gotten her period for 3-6 months.  This condition has many causes. In many cases, treating the underlying cause will return menstrual periods back to a normal cycle.  Talk to your health care provider if your periods do not return to normal after treatment. This information is not intended to replace advice given to you by your health care provider. Make sure you discuss any questions you have with your health care provider. Document Released: 11/23/2006 Document Revised: 03/24/2018 Document Reviewed: 12/31/2016 Elsevier Patient Education  2020 [employer].

## 2019-09-07 NOTE — Assessment & Plan Note (Signed)
Referral to [facility]. Contracts for safety. Start Zoloft. Normal onset of action and plan discussed.

## 2019-09-07 NOTE — Assessment & Plan Note (Signed)
Fibroid noted--? Infarcted led to pain. Check pelvic sono

## 2019-09-08 ENCOUNTER — Telehealth: Payer: Self-pay | Admitting: *Deleted

## 2019-09-08 LAB — FOLLICLE STIMULATING HORMONE: FSH: 5.2 m[IU]/mL

## 2019-09-08 LAB — TSH: TSH: 2.03 u[IU]/mL (ref 0.450–4.500)

## 2019-09-08 LAB — PROLACTIN: Prolactin: 32.3 ng/mL — ABNORMAL HIGH (ref 4.8–23.3)

## 2019-09-08 NOTE — Telephone Encounter (Signed)
I called Judith Lowe and notified her of elevated prolactin level and that Dr. Kennon Rounds would like to repeat it fasting. She agreed to appointment for fasting lab on 09/12/19 at 1000. She voices understanding. Jacques Navy

## 2019-09-08 NOTE — Telephone Encounter (Signed)
-----   Message from Donnamae Jude, MD sent at 09/08/2019  8:12 AM EST ----- Needs to return for fasting prolactin please.

## 2019-09-11 ENCOUNTER — Other Ambulatory Visit: Payer: Self-pay | Admitting: *Deleted

## 2019-09-11 ENCOUNTER — Telehealth: Payer: Self-pay | Admitting: Family Medicine

## 2019-09-11 DIAGNOSIS — N911 Secondary amenorrhea: Secondary | ICD-10-CM

## 2019-09-11 NOTE — Telephone Encounter (Signed)
Spoke to patient about her appointment on 11/17 @ 10:00. Patient instructed to wear a face mask for the entire appointment and no visitors are allowed with her during the visit. Patient screened for covid symptoms and denied having any. Patient was instructed to come fasting.

## 2019-09-12 ENCOUNTER — Other Ambulatory Visit: Payer: Self-pay

## 2019-09-12 DIAGNOSIS — N911 Secondary amenorrhea: Secondary | ICD-10-CM

## 2019-09-13 ENCOUNTER — Telehealth: Payer: Self-pay | Admitting: *Deleted

## 2019-09-13 LAB — PROLACTIN: Prolactin: 22.7 ng/mL (ref 4.8–23.3)

## 2019-09-13 NOTE — Telephone Encounter (Signed)
-----   Message from Donnamae Jude, MD sent at 09/13/2019  9:38 AM EST ----- 2nd lab was normal.

## 2019-09-13 NOTE — Telephone Encounter (Signed)
I called [facility] and left a message I was calling with non urgent results -please call our office. Brinlynn Gorton,RN

## 2019-09-14 NOTE — Telephone Encounter (Signed)
Called patient and informed her of results. Patient verbalized understanding & asked about ultrasound appt. Told patient no ultrasound has been scheduled yet. Scheduled 12/2 @ 9am & informed patient. Patient verbalized understanding & had no questions.

## 2019-09-27 ENCOUNTER — Ambulatory Visit (HOSPITAL_COMMUNITY): Admission: RE | Admit: 2019-09-27 | Payer: Self-pay | Source: Ambulatory Visit

## 2019-10-02 ENCOUNTER — Telehealth: Payer: Self-pay | Admitting: Family Medicine

## 2019-10-02 NOTE — Telephone Encounter (Signed)
Attempted to contact patient to get her scheduled for her u/s and change her visit to a mychart visit. No answer , left voicemail for patient to give the office a call back to get the ultrasound scheduled. Patient instructed to call back ASAP.

## 2019-10-03 ENCOUNTER — Telehealth: Payer: Self-pay | Admitting: Family Medicine

## 2019-10-03 NOTE — Telephone Encounter (Signed)
Attempted to contact patient to get her ultrasound schedule prior to her visit on 12/15. No answer, left voicemail for patient to give the office a call back to be scheduled. Patient instructed her appointment on 12/15 will be cancelled if she does not have the ultrasound prior to.

## 2019-10-05 NOTE — BH Specialist Note (Signed)
Pt did not arrive to video visit and did not answer the phone ; Left HIPPA-compliant message to call back Roselyn Reef from [facility] [employer] [facility] at [phone].    [place] via Telemedicine Video Visit  10/05/2019 Judith Lowe [id number]  [place]

## 2019-10-10 ENCOUNTER — Ambulatory Visit: Payer: Self-pay | Admitting: Family Medicine

## 2019-10-10 ENCOUNTER — Encounter: Payer: Self-pay | Admitting: Family Medicine

## 2019-10-10 ENCOUNTER — Ambulatory Visit: Payer: Self-pay | Admitting: Clinical

## 2019-10-10 DIAGNOSIS — Z5329 Procedure and treatment not carried out because of patient's decision for other reasons: Secondary | ICD-10-CM

## 2019-10-10 DIAGNOSIS — Z91199 Patient's noncompliance with other medical treatment and regimen due to unspecified reason: Secondary | ICD-10-CM

## 2019-10-11 ENCOUNTER — Ambulatory Visit (HOSPITAL_COMMUNITY): Admission: RE | Admit: 2019-10-11 | Payer: Self-pay | Source: Ambulatory Visit

## 2019-10-26 ENCOUNTER — Telehealth: Payer: Self-pay | Admitting: Family Medicine

## 2020-04-16 IMAGING — CT CT ABD-PELV W/ CM
2 of 4 series · 16 of 46 positions shown, 18 images · IV contrast (omnipaque)
Comparison: None.

CLINICAL DATA: Abdominal pain, acute

EXAM:
CT ABDOMEN AND PELVIS WITH CONTRAST
TECHNIQUE: Multidetector CT imaging of the abdomen and pelvis was performed
using the standard protocol following bolus administration of
intravenous contrast.
CONTRAST:  100mL OMNIPAQUE IOHEXOL 300 MG/ML  SOLN

[Series 3: a/p w/ 5mm · axial · 0.71mm/px · z∈[+738,+1123]mm · 13 of 88 slices shown, 15 images]
[im 7/88  soft-tissue]
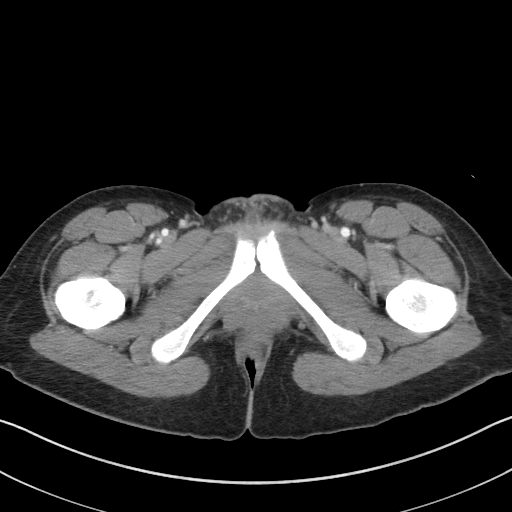
[im 7/88  bone]
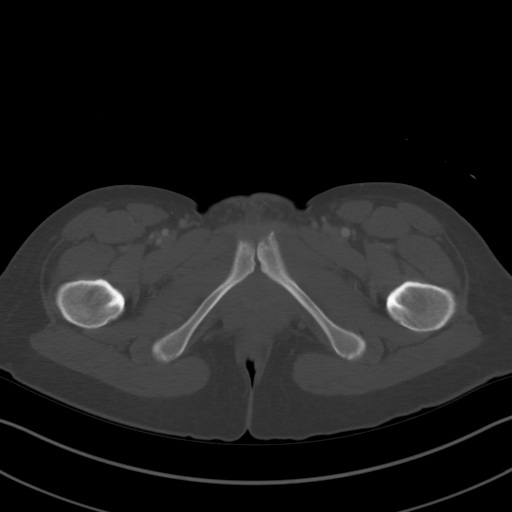
[im 13/88  soft-tissue]
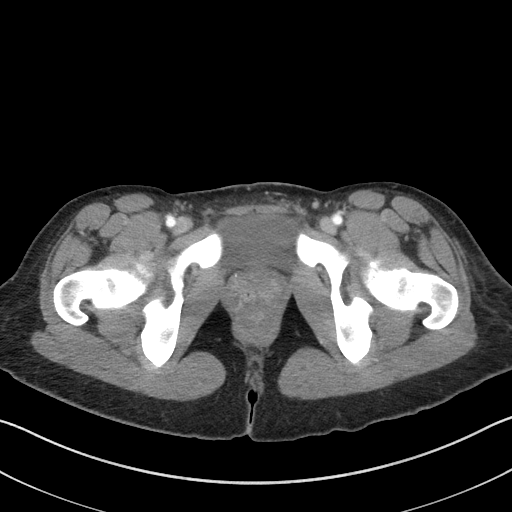
[im 20/88  soft-tissue]
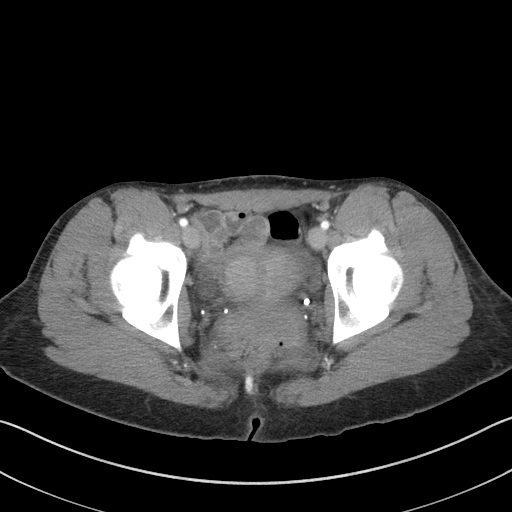
[im 26/88  soft-tissue]
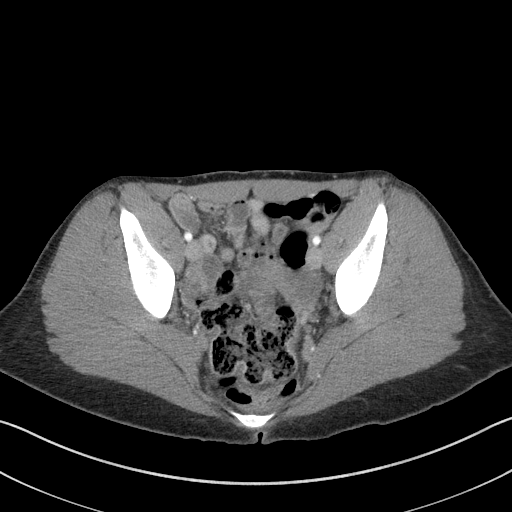
[im 33/88  soft-tissue]
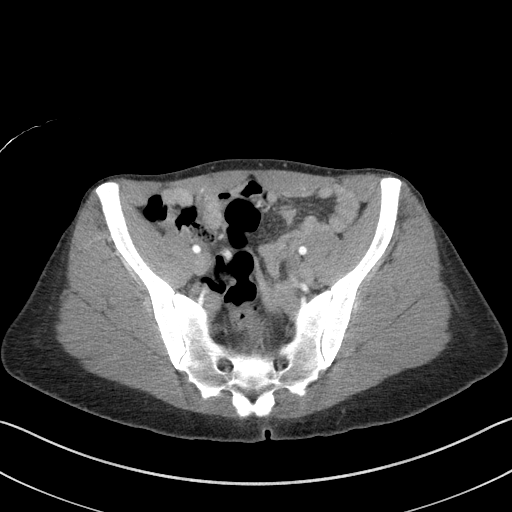
[im 39/88  soft-tissue]
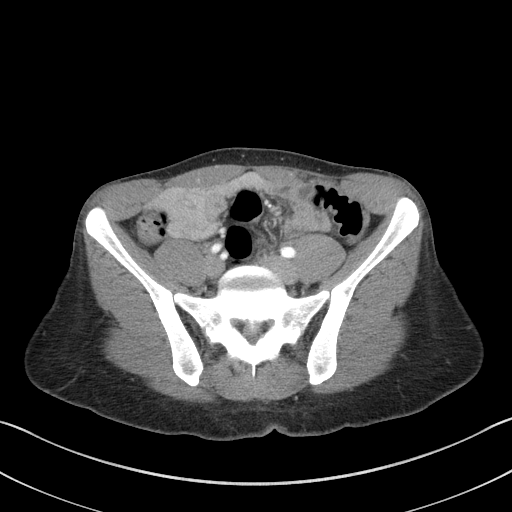
[im 46/88  soft-tissue]
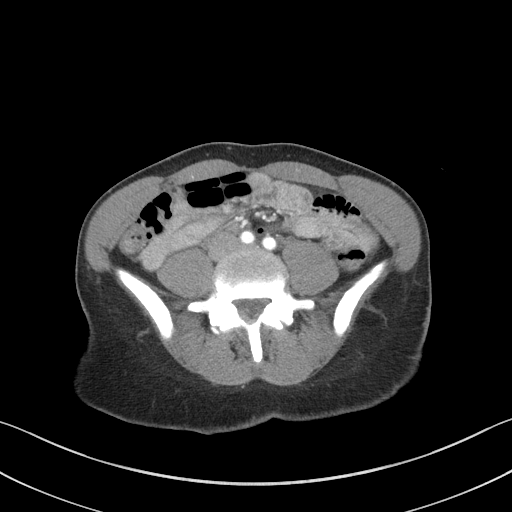
[im 52/88  soft-tissue]
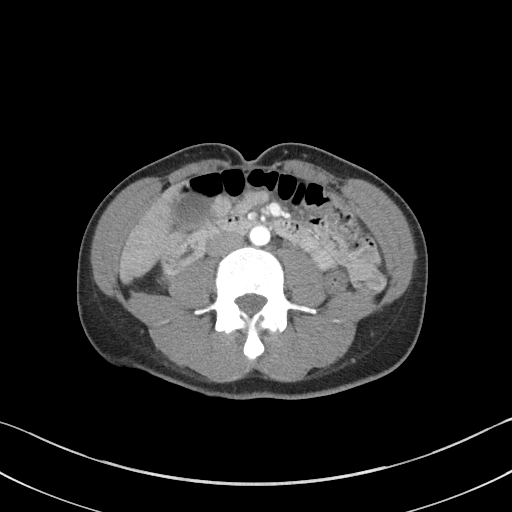
[im 59/88  soft-tissue]
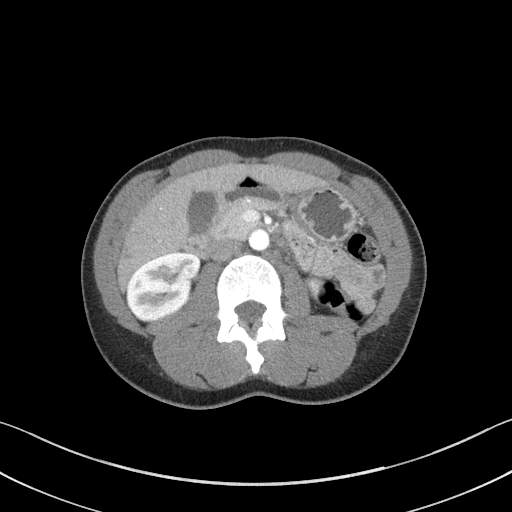
[im 59/88  bone]
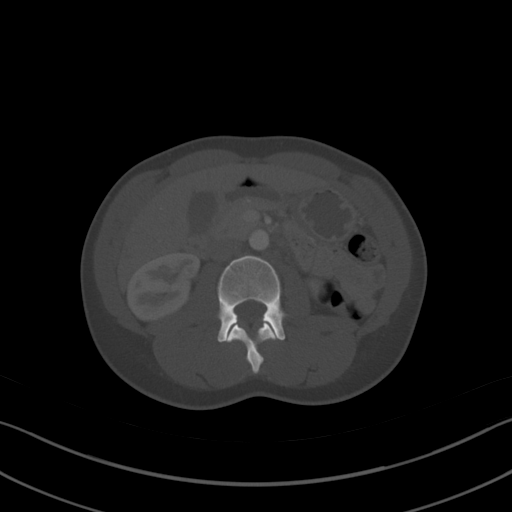
[im 65/88  soft-tissue]
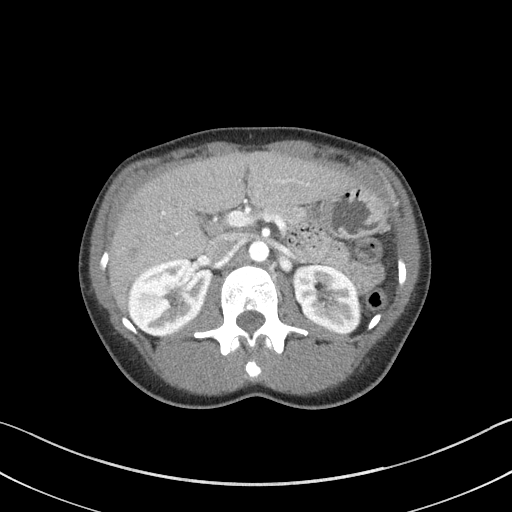
[im 71/88  soft-tissue]
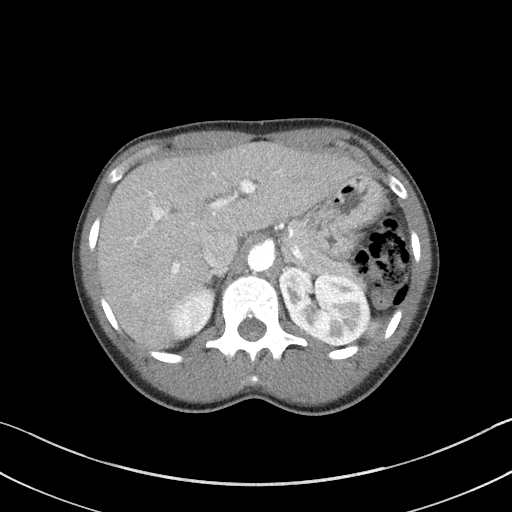
[im 78/88  soft-tissue]
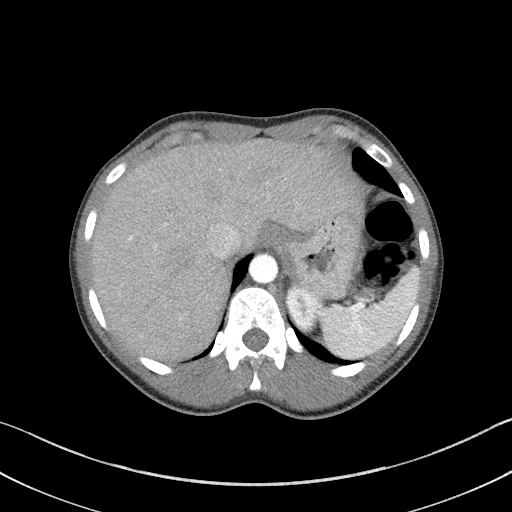
[im 84/88  soft-tissue]
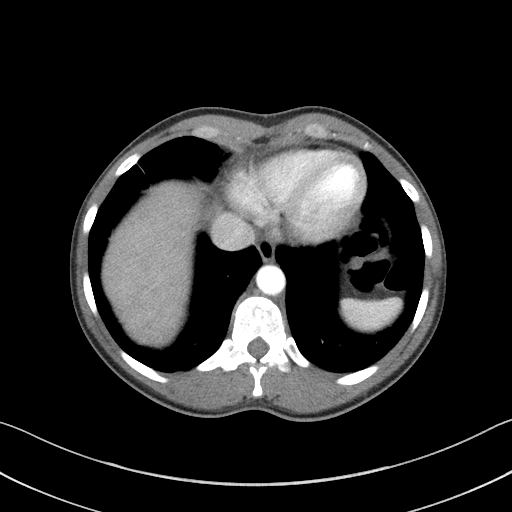

[Series 6: a/p w/ cor · coronal · 0.71mm/px · 3 of 135 slices shown]
[im 45/135  soft-tissue]
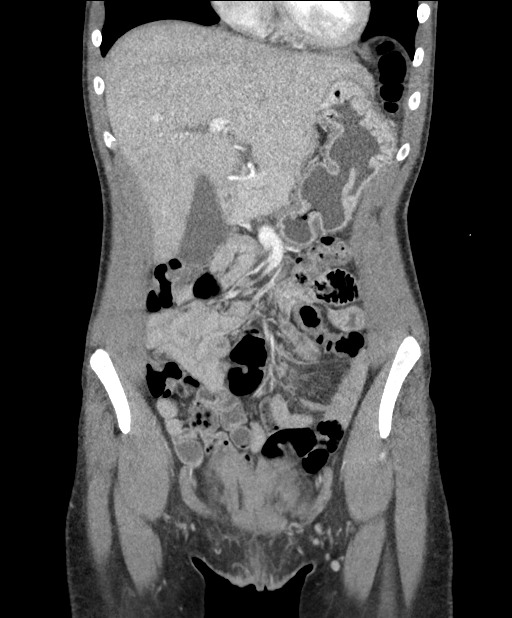
[im 60/135  soft-tissue]
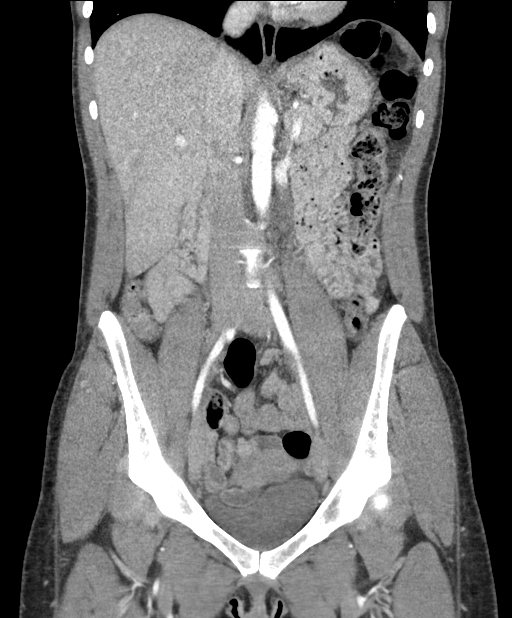
[im 75/135  soft-tissue]
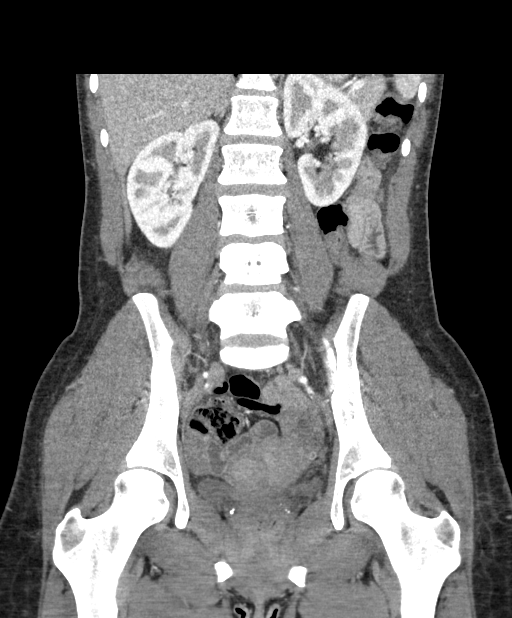

[16 of 46 positions shown; findings below may reference images not displayed]

FINDINGS: Lower chest: The visualized heart size within normal limits. No
pericardial fluid/thickening.

No hiatal hernia.

The visualized portions of the lungs are clear.

Hepatobiliary: The liver is normal in density without focal
abnormality.The main portal vein is patent. No evidence of calcified
gallstones, gallbladder wall thickening or biliary dilatation.

Pancreas: Unremarkable. No pancreatic ductal dilatation or
surrounding inflammatory changes.

Spleen: Normal in size without focal abnormality.

Adrenals/Urinary Tract: Both adrenal glands appear normal. The
kidneys and collecting system appear normal without evidence of
urinary tract calculus or hydronephrosis. Bladder is unremarkable.

Stomach/Bowel: The stomach, small bowel, and colon are normal in
appearance. No inflammatory changes, wall thickening, or obstructive
findings.The appendix is normal.

Vascular/Lymphatic: There are no enlarged mesenteric,
retroperitoneal, or pelvic lymph nodes. No significant vascular
findings are present.

Reproductive: There is a lobular heterogeneous uterine fibroid seen
in the posterior fundus. There appears to be a small amount of
hyperdense fluid seen within the endometrial canal. Small amount of
free fluid in the cul-de-sac.

Other: No evidence of abdominal wall mass or hernia.

Musculoskeletal: No acute or significant osseous findings.
IMPRESSION: Probable intrauterine fibroids with a small amount of blood products
in the endometrial canal.

No other acute intra-abdominal or pelvic pathology.

## 2020-06-07 ENCOUNTER — Telehealth: Payer: Self-pay

## 2020-06-07 NOTE — Telephone Encounter (Signed)
Attempted to contact patient regarding [employer] application, received automated recording "call cannot be completed at this time."

## 2020-06-29 ENCOUNTER — Other Ambulatory Visit: Payer: Self-pay

## 2020-06-29 ENCOUNTER — Ambulatory Visit (HOSPITAL_COMMUNITY)
Admission: EM | Admit: 2020-06-29 | Discharge: 2020-06-29 | Disposition: A | Discharge status: Home / Self Care | Payer: Self-pay

## 2020-06-29 NOTE — ED Notes (Signed)
Judith Lowe, patient access, states patient went to her car to wait.  States multiple calls have been made and patient has not answered calls.

## 2020-08-16 ENCOUNTER — Emergency Department (HOSPITAL_COMMUNITY)
Admission: EM | Admit: 2020-08-16 | Discharge: 2020-08-16 | Disposition: A | Discharge status: Home / Self Care | Payer: Self-pay | Attending: Emergency Medicine | Admitting: Emergency Medicine

## 2020-08-16 ENCOUNTER — Encounter (HOSPITAL_COMMUNITY): Payer: Self-pay

## 2020-08-16 ENCOUNTER — Other Ambulatory Visit: Payer: Self-pay

## 2020-08-16 DIAGNOSIS — Y990 Civilian activity done for income or pay: Secondary | ICD-10-CM | POA: Insufficient documentation

## 2020-08-16 DIAGNOSIS — F17210 Nicotine dependence, cigarettes, uncomplicated: Secondary | ICD-10-CM | POA: Insufficient documentation

## 2020-08-16 DIAGNOSIS — S0501XA Injury of conjunctiva and corneal abrasion without foreign body, right eye, initial encounter: Secondary | ICD-10-CM | POA: Insufficient documentation

## 2020-08-16 DIAGNOSIS — W262XXA Contact with edge of stiff paper, initial encounter: Secondary | ICD-10-CM | POA: Insufficient documentation

## 2020-08-16 MED ORDER — OXYCODONE HCL 5 MG PO TABS
5.0000 mg | ORAL_TABLET | Freq: Four times a day (QID) | ORAL | 0 refills | Status: DC | PRN
Start: 2020-08-16 — End: 2021-10-06

## 2020-08-16 MED ORDER — TETRACAINE HCL 0.5 % OP SOLN
2.0000 [drp] | Freq: Once | OPHTHALMIC | Status: AC
  Administered 2020-08-16: 2 [drp] via OPHTHALMIC
  Filled 2020-08-16: qty 4

## 2020-08-16 MED ORDER — ERYTHROMYCIN 5 MG/GM OP OINT
1.0000 "application " | TOPICAL_OINTMENT | Freq: Once | OPHTHALMIC | Status: AC
  Administered 2020-08-16: 1 via OPHTHALMIC
  Filled 2020-08-16: qty 3.5

## 2020-08-16 MED ORDER — FLUORESCEIN SODIUM 1 MG OP STRP
1.0000 | ORAL_STRIP | Freq: Once | OPHTHALMIC | Status: AC
  Administered 2020-08-16: 1 via OPHTHALMIC
  Filled 2020-08-16: qty 1

## 2020-08-16 NOTE — ED Triage Notes (Signed)
Pt also reports vision is blurry out of right eye.

## 2020-08-16 NOTE — ED Triage Notes (Signed)
Was opening cardboard box and scraped right eye at midnight.  Pt reports feeling of eye scraping, and burning. Eye is irritated and running.

## 2020-08-16 NOTE — Discharge Instructions (Signed)
Please read and follow all provided instructions.  Your diagnoses today include:  1. Abrasion of right cornea, initial encounter     Tests performed today include:  Visual acuity testing to check your vision  Fluorescein dye examination to look for scratches on your eye - you have a scratch on your eye  Vital signs. See below for your results today.   Medications prescribed:   Oxycodone - narcotic pain medication  DO NOT drive or perform any activities that require you to be awake and alert because this medicine can make you drowsy.   Take any prescribed medications only as directed.  Home care instructions:  Follow any educational materials contained in this packet.  You have a scratch of the eye on the cornea (the clear part of the eye). This condition may be caused by trauma. It is a common problem for people who wear contact lenses. Proper treatment is important. No evidence of infection is noted today, but you could develop an infection called a corneal ulcer or have some retained foreign body that may or may not have been noted today in the Emergency Department. Ulcers are not only painful, but they may also scar the cornea and cause permanent damage to the eye.   If you wear contact lenses, do not use them until your eye caregiver approves. Follow-up care is necessary to be sure the corneal abrasion is healing if not completely resolved in 2-3 days. See your caregiver or eye specialist as suggested for followup.   Follow-up instructions: Please follow-up with the opthalmologist listed in the next 2-3 days for further evaluation of your symptoms if pain is not improved.   Return instructions:   Please return to the Emergency Department if you experience worsening symptoms.   Please return immediately if you develop severe pain, pus drainage, new change in vision, or fever.  Please return if you have any other emergent concerns.  Additional Information:  Your vital  signs today were: BP (!) 143/93 (BP Location: Right Arm)    Pulse 88    Temp 98.7 F (37.1 C) (Oral)    Resp 16    Ht 5\' 9"  (1.753 m)    Wt 56.7 kg    SpO2 99%    BMI 18.46 kg/m  If your blood pressure (BP) was elevated above 135/85 this visit, please have this repeated by your doctor within one month.

## 2020-08-16 NOTE — ED Notes (Signed)
Pt d/c home per MD order. Discharge summary reviewed with pt, pt verbalizes understanding. Ambulatory off unit.

## 2020-08-16 NOTE — ED Provider Notes (Signed)
[facility] EMERGENCY DEPARTMENT Provider Note   CSN: [id number] Arrival date & time: 08/16/20  0272     History Chief Complaint  Patient presents with  . Eye Pain    Judith Lowe is a 38 y.o. female.  Patient presents the emergency department for evaluation of cute onset of right eye pain starting early this morning while she was at work.  Patient states that she was opening a cardboard box when the edge of the cardboard flap scraped her right eye.  She had pain that gradually worsened overnight.  She reports tearing.  She reports that her vision looks a bit fuzzy in the right eye.  She denies previous history of eye surgeries or corrective lenses.  She took ibuprofen prior to arrival, no other treatments.  She denies other injury.        Past Medical History:  Diagnosis Date  . Drug use   . ETOH abuse   . Medical history non-contributory     Patient Active Problem List   Diagnosis Date Noted  . Sad mood 09/07/2019  . 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 Pelvic pain 09/07/2019    Past Surgical History:  Procedure Laterality Date  . NO PAST SURGERIES       OB History    Gravida  3   Para  2   Term  2   Preterm      AB  1   Living  2     SAB  1   TAB      Ectopic      Multiple      Live Births  2           No family history on file.  Social History   Tobacco Use  . Smoking status: Current Every Day Smoker    Packs/day: 0.25    Years: 5.00    Pack years: 1.25    Types: Cigarettes  . Smokeless tobacco: Never Used  Substance Use Topics  . Alcohol use: Yes    Alcohol/week: 1.0 standard drink    Types: 1 Glasses of wine per week    Comment: occaisionally; once every two weeks   . Drug use: Yes    Types: Marijuana    Comment: pt reports smoking everyday    Home Medications Prior to Admission medications   Medication Sig Start Date End Date Taking? Authorizing Provider  ibuprofen (ADVIL) 800 MG tablet Take 1 tablet  (800 mg total) by mouth 3 (three) times daily. 08/19/19   Montine Circle, PA-C  Ibuprofen-diphenhydrAMINE Cit 200-38 MG TABS Take 2 tablets by mouth at bedtime as needed (sleep and pain).    [provider]  oxyCODONE (OXY IR/ROXICODONE) 5 MG immediate release tablet Take 1 tablet (5 mg total) by mouth every 6 (six) hours as needed for severe pain. 08/16/20   Carlisle Cater, PA-C  sertraline (ZOLOFT) 50 MG tablet Take 1 tablet (50 mg total) by mouth daily. 09/07/19   Donnamae Jude, MD    Allergies    Patient has no known allergies.  Review of Systems   Review of Systems  Constitutional: Negative for fever.  Eyes: Positive for photophobia, pain, discharge (Tearing), redness and visual disturbance. Negative for itching.  Gastrointestinal: Negative for nausea and vomiting.    Physical Exam Updated Vital Signs BP (!) 143/93 (BP Location: Right Arm)   Pulse 88   Temp 98.7 F (37.1 C) (Oral)   Resp 16   Ht 5'  9" (1.753 m)   Wt 56.7 kg   SpO2 99%   BMI 18.46 kg/m   Physical Exam Vitals and nursing note reviewed.  Constitutional:      Appearance: She is well-developed.  HENT:     Head: Normocephalic and atraumatic.  Eyes:     General: Lids are normal.     Conjunctiva/sclera:     Right eye: Right conjunctiva is injected.     Left eye: Left conjunctiva is not injected.     Pupils:     Right eye: Corneal abrasion and fluorescein uptake present. Seidel exam negative.     Comments: Negative Seidel.  Patient with a obvious corneal abrasion seen without fluorescein.  After fluorescein applied, abrasion seen to extend from the visual axis superiorly to the edge of the cornea at 12:00.  Pulmonary:     Effort: No respiratory distress.  Musculoskeletal:     Cervical back: Normal range of motion and neck supple.  Skin:    General: Skin is warm and dry.  Neurological:     Mental Status: She is alert.     ED Results / Procedures / Treatments   Labs (all labs ordered are  listed, but only abnormal results are displayed) Labs Reviewed - No data to display  EKG None  Radiology No results found.  Procedures Procedures (including critical care time)  Medications Ordered in ED Medications  tetracaine (PONTOCAINE) 0.5 % ophthalmic solution 2 drop (2 drops Right Eye Given by Other 08/16/20 0950)  fluorescein ophthalmic strip 1 strip (1 strip Right Eye Given by Other 08/16/20 0950)  erythromycin ophthalmic ointment 1 application (1 application Right Eye Given 08/16/20 1024)    ED Course  I have reviewed the triage vital signs and the nursing notes.  Pertinent labs & imaging results that were available during my care of the patient were reviewed by me and considered in my medical decision making (see chart for details).  Patient seen and examined. Work-up initiated. Medications ordered.   Vital signs reviewed and are as follows: BP (!) 143/93 (BP Location: Right Arm)   Pulse 88   Temp 98.7 F (37.1 C) (Oral)   Resp 16   Ht 5\' 9"  (1.753 m)   Wt 56.7 kg   SpO2 99%   BMI 18.46 kg/m   Two drops of tetracaine/proparacaine instilled into affected eye.   Fluorescein strip applied to affected eye. Wood's lamp used to assess for corneal abrasion. Corneal abrasion identified. No foreign bodies noted. No visible hyphema.  Resolution of pain after administration of tetracaine drops.  Patient tolerated procedure well without immediate complication.   Patient will be given erythromycin ointment, ophthalmology follow-up --encouraged to follow-up if pain persists for more than 2 to 3 days or if she has any difficulties with vision.  I have prescribed #5 oxycodone 5 mg tablets for pain over the next 1 to 2 days.  Patient counseled on use of narcotic pain medications. Counseled not to combine these medications with others containing tylenol. Urged not to drink alcohol, drive, or perform any other activities that requires focus while taking these medications. The  patient verbalizes understanding and agrees with the plan.    Visual Acuity  Right Eye Distance: 20/15 Left Eye Distance: 20/20 Bilateral Distance: 20/25  Right Eye Near:   Left Eye Near:    Bilateral Near:        MDM Rules/Calculators/A&P  Patient with right sided corneal abrasion, do not suspect open globe.  No indications for emergent ophthalmologic consultation.   Final Clinical Impression(s) / ED Diagnoses Final diagnoses:  Abrasion of right cornea, initial encounter    Rx / DC Orders ED Discharge Orders         Ordered    oxyCODONE (OXY IR/ROXICODONE) 5 MG immediate release tablet  Every 6 hours PRN        08/16/20 1015           Carlisle Cater, PA-C 08/16/20 1037    Wyvonnia Dusky, MD 08/16/20 1901

## 2021-02-19 ENCOUNTER — Encounter (HOSPITAL_COMMUNITY): Payer: Self-pay | Admitting: Emergency Medicine

## 2021-02-19 ENCOUNTER — Emergency Department (HOSPITAL_COMMUNITY)
Admission: EM | Admit: 2021-02-19 | Discharge: 2021-02-19 | Disposition: A | Discharge status: Home / Self Care | Payer: Self-pay | Attending: Emergency Medicine | Admitting: Emergency Medicine

## 2021-02-19 ENCOUNTER — Other Ambulatory Visit: Payer: Self-pay

## 2021-02-19 DIAGNOSIS — D649 Anemia, unspecified: Secondary | ICD-10-CM | POA: Insufficient documentation

## 2021-02-19 DIAGNOSIS — F17210 Nicotine dependence, cigarettes, uncomplicated: Secondary | ICD-10-CM | POA: Insufficient documentation

## 2021-02-19 DIAGNOSIS — R112 Nausea with vomiting, unspecified: Secondary | ICD-10-CM | POA: Insufficient documentation

## 2021-02-19 LAB — COMPREHENSIVE METABOLIC PANEL
ALT: 19 U/L (ref 0–44)
AST: 28 U/L (ref 15–41)
Albumin: 3.3 g/dL — ABNORMAL LOW (ref 3.5–5.0)
Alkaline Phosphatase: 50 U/L (ref 38–126)
Anion gap: 7 (ref 5–15)
BUN: 9 mg/dL (ref 6–20)
CO2: 26 mmol/L (ref 22–32)
Calcium: 8.9 mg/dL (ref 8.9–10.3)
Chloride: 105 mmol/L (ref 98–111)
Creatinine, Ser: 0.68 mg/dL (ref 0.44–1.00)
GFR, Estimated: >60 mL/min (ref >60)
Glucose, Bld: 103 mg/dL — ABNORMAL HIGH (ref 70–99)
Potassium: 3.5 mmol/L (ref 3.5–5.1)
Sodium: 138 mmol/L (ref 135–145)
Total Bilirubin: 0.3 mg/dL (ref 0.3–1.2)
Total Protein: 7.2 g/dL (ref 6.5–8.1)

## 2021-02-19 LAB — CBC WITH DIFFERENTIAL/PLATELET
Abs Immature Granulocytes: 0.01 10*3/uL (ref 0.00–0.07)
Basophils Absolute: 0 10*3/uL (ref 0.0–0.1)
Basophils Relative: 1 %
Eosinophils Absolute: 0.3 10*3/uL (ref 0.0–0.5)
Eosinophils Relative: 9 %
HCT: 25.4 % — ABNORMAL LOW (ref 36.0–46.0)
Hemoglobin: 7.3 g/dL — ABNORMAL LOW (ref 12.0–15.0)
Immature Granulocytes: 0 %
Lymphocytes Relative: 39 %
Lymphs Abs: 1.6 10*3/uL (ref 0.7–4.0)
MCH: 23.1 pg — ABNORMAL LOW (ref 26.0–34.0)
MCHC: 28.7 g/dL — ABNORMAL LOW (ref 30.0–36.0)
MCV: 80.4 fL (ref 80.0–100.0)
Monocytes Absolute: 0.5 10*3/uL (ref 0.1–1.0)
Monocytes Relative: 11 %
Neutro Abs: 1.6 10*3/uL — ABNORMAL LOW (ref 1.7–7.7)
Neutrophils Relative %: 40 %
Platelets: 481 10*3/uL — ABNORMAL HIGH (ref 150–400)
RBC: 3.16 MIL/uL — ABNORMAL LOW (ref 3.87–5.11)
RDW: 18.2 % — ABNORMAL HIGH (ref 11.5–15.5)
WBC: 4 10*3/uL (ref 4.0–10.5)
nRBC: 0 % (ref 0.0–0.2)

## 2021-02-19 LAB — URINALYSIS, ROUTINE W REFLEX MICROSCOPIC
Bilirubin Urine: NEGATIVE
Glucose, UA: NEGATIVE mg/dL
Hgb urine dipstick: NEGATIVE
Ketones, ur: NEGATIVE mg/dL
Leukocytes,Ua: NEGATIVE
Nitrite: NEGATIVE
Protein, ur: 30 mg/dL — AB
Specific Gravity, Urine: 1.02 (ref 1.005–1.030)
pH: 6 (ref 5.0–8.0)

## 2021-02-19 LAB — PREPARE RBC (CROSSMATCH)

## 2021-02-19 LAB — POC OCCULT BLOOD, ED: Fecal Occult Bld: NEGATIVE

## 2021-02-19 LAB — LIPASE, BLOOD: Lipase: 26 U/L (ref 11–51)

## 2021-02-19 MED ORDER — SODIUM CHLORIDE 0.9% IV SOLUTION: Freq: Once | INTRAVENOUS | Status: AC

## 2021-02-19 NOTE — ED Notes (Signed)
Patient verbalized understanding of discharge instructions. Opportunity for questions and answers.  

## 2021-02-19 NOTE — Discharge Instructions (Signed)
Call your primary care doctor or specialist as discussed in the next 2-3 days.   Return immediately back to the ER if:  Your symptoms worsen within the next 12-24 hours. You develop new symptoms such as new fevers, persistent vomiting, new pain, shortness of breath, or new weakness or numbness, or if you have any other concerns.  

## 2021-02-19 NOTE — ED Triage Notes (Signed)
Pt here from with c/o n/v and weakness  Times 5 days

## 2021-02-19 NOTE — ED Triage Notes (Signed)
Emergency Medicine Provider Triage Evaluation Note  Cooper Stamp , a 40 y.o. female  was evaluated in triage.  Pt complains of n/v/ generalized weakness.  Review of Systems  Positive: N/v, weakness, decreased urine output Negative: CP, abdominal pain, diarrhea, sick contacts  Physical Exam  BP (!) 162/103 (BP Location: Left Arm)   Pulse 84   Temp 97.9 F (36.6 C) (Oral)   Resp 18   SpO2 100%  Gen:   Awake, no distress   HEENT:  Atraumatic  Resp:  Normal effort  Cardiac:  Normal rate  Abd:   Nondistended, nontender  MSK:   Moves extremities without difficulty  Neuro:  Speech clear   Medical Decision Making  Medically screening exam initiated at 2:53 PM.  Appropriate orders placed.  Tiajah Oyster was informed that the remainder of the evaluation will be completed by another provider, this initial triage assessment does not replace that evaluation, and the importance of remaining in the ED until their evaluation is complete.  Clinical Impression     Tacy Learn, PA-C 02/19/21 1454

## 2021-02-19 NOTE — ED Notes (Signed)
This NT witness pt leaving.

## 2021-02-19 NOTE — ED Provider Notes (Signed)
[place] EMERGENCY DEPARTMENT Provider Note   CSN: [id number] Arrival date & time: 02/19/21  1408     History No chief complaint on file.   Judith Lowe is a 40 y.o. female.  Patient presents to ER chief complaint of nausea intermittent vomiting generalized weakness and fatigue.  Symptoms been ongoing for about 2 to 3 weeks.  She states that she can keep down some chicken soup but has been throwing it up every once in a while.  Denies any diarrhea.  Denies any current headache or chest pain or abdominal pain, however she is had intermittent headaches for which she is been taking ibuprofen for the past few days.  Denies fevers or cough.        Past Medical History:  Diagnosis Date  . Drug use   . ETOH abuse   . Medical history non-contributory     Patient Active Problem List   Diagnosis Date Noted  . Sad mood 09/07/2019  . 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 Pelvic pain 09/07/2019    Past Surgical History:  Procedure Laterality Date  . NO PAST SURGERIES       OB History    Gravida  3   Para  2   Term  2   Preterm      AB  1   Living  2     SAB  1   IAB      Ectopic      Multiple      Live Births  2           No family history on file.  Social History   Tobacco Use  . Smoking status: Current Every Day Smoker    Packs/day: 0.25    Years: 5.00    Pack years: 1.25    Types: Cigarettes  . Smokeless tobacco: Never Used  Substance Use Topics  . Alcohol use: Yes    Alcohol/week: 1.0 standard drink    Types: 1 Glasses of wine per week    Comment: occaisionally; once every two weeks   . Drug use: Yes    Types: Marijuana    Comment: pt reports smoking everyday    Home Medications Prior to Admission medications   Medication Sig Start Date End Date Taking? Authorizing Provider  ibuprofen (ADVIL) 800 MG tablet Take 1 tablet (800 mg total) by mouth 3 (three) times daily. 08/19/19   Montine Circle, PA-C   Ibuprofen-diphenhydrAMINE Cit 200-38 MG TABS Take 2 tablets by mouth at bedtime as needed (sleep and pain).    [provider]  oxyCODONE (OXY IR/ROXICODONE) 5 MG immediate release tablet Take 1 tablet (5 mg total) by mouth every 6 (six) hours as needed for severe pain. 08/16/20   Carlisle Cater, PA-C  sertraline (ZOLOFT) 50 MG tablet Take 1 tablet (50 mg total) by mouth daily. 09/07/19   Donnamae Jude, MD    Allergies    Patient has no known allergies.  Review of Systems   Review of Systems  Constitutional: Negative for fever.  HENT: Negative for ear pain.   Eyes: Negative for pain.  Respiratory: Negative for cough.   Cardiovascular: Negative for chest pain.  Gastrointestinal: Negative for abdominal pain.  Genitourinary: Negative for flank pain.  Musculoskeletal: Negative for back pain.  Skin: Negative for rash.  Neurological: Positive for headaches.    Physical Exam Updated Vital Signs BP (!) 173/100   Pulse 71   Temp 98.7 F (37.1 C) (Oral)  Resp 19   SpO2 100%   Physical Exam Constitutional:      General: She is not in acute distress.    Appearance: Normal appearance.  HENT:     Head: Normocephalic.     Nose: Nose normal.  Eyes:     Extraocular Movements: Extraocular movements intact.  Cardiovascular:     Rate and Rhythm: Normal rate.  Pulmonary:     Effort: Pulmonary effort is normal.  Musculoskeletal:        General: Normal range of motion.     Cervical back: Normal range of motion.  Neurological:     General: No focal deficit present.     Mental Status: She is alert and oriented to person, place, and time. Mental status is at baseline.     Cranial Nerves: No cranial nerve deficit.     Motor: No weakness.     Gait: Gait normal.     ED Results / Procedures / Treatments   Labs (all labs ordered are listed, but only abnormal results are displayed) Labs Reviewed  CBC WITH DIFFERENTIAL/PLATELET - Abnormal; Notable for the following components:       Result Value   RBC 3.16 (*)    Hemoglobin 7.3 (*)    HCT 25.4 (*)    MCH 23.1 (*)    MCHC 28.7 (*)    RDW 18.2 (*)    Platelets 481 (*)    Neutro Abs 1.6 (*)    All other components within normal limits  COMPREHENSIVE METABOLIC PANEL - Abnormal; Notable for the following components:   Glucose, Bld 103 (*)    Albumin 3.3 (*)    All other components within normal limits  URINALYSIS, ROUTINE W REFLEX MICROSCOPIC - Abnormal; Notable for the following components:   APPearance HAZY (*)    Protein, ur 30 (*)    Bacteria, UA RARE (*)    All other components within normal limits  LIPASE, BLOOD  POC OCCULT BLOOD, ED  TYPE AND SCREEN  PREPARE RBC (CROSSMATCH)    EKG EKG Interpretation  Date/Time:  Wednesday February 19 2021 20:29:28 EDT Ventricular Rate:  72 PR Interval:  155 QRS Duration: 93 QT Interval:  429 QTC Calculation: 470 R Axis:   60 Text Interpretation: Sinus rhythm Left ventricular hypertrophy Anterior infarct, old Confirmed by Thamas Jaegers ([id number]) on 02/19/2021 10:06:50 PM   Radiology No results found.  Procedures .Critical Care E&M Performed by: Luna Fuse, MD  Critical care provider statement:    Critical care time (minutes):  30   Critical care time was exclusive of:  Separately billable procedures and treating other patients   Critical care was necessary to treat or prevent imminent or life-threatening deterioration of the following conditions:  Circulatory failure After initial E/M assessment, critical care services were subsequently performed that were exclusive of separately billable procedures or treatment.   Comments:     Requiring emergent blood transfusion     Medications Ordered in ED Medications  0.9 %  sodium chloride infusion (Manually program via Guardrails IV Fluids) (has no administration in time range)    ED Course  I have reviewed the triage vital signs and the nursing notes.  Pertinent labs & imaging results that were  available during my care of the patient were reviewed by me and considered in my medical decision making (see chart for details).    MDM Rules/Calculators/A&P  Labs show hemoglobin of 7.3.  This is lower than her baseline which appears to be around 10 or 11.  Patient denies any blood in her stool or any other bleeding that she is noticed.  She denies any dark stools.  Given her history of ibuprofen use, rectal exam was performed with nursing chaperone.  Exam was grossly brown-colored stool and it was guaiac negative.  Etiology of her anemia is unclear.  Given that she is symptomatic, transfuse 1 unit of PRBCs after risks and benefits were discussed with the patient.  Advise follow-up again with the primary care doctor within the week.  Advising immediate return for worsening symptoms fevers cough or any additional concerns.  Final Clinical Impression(s) / ED Diagnoses Final diagnoses:  Symptomatic anemia    Rx / DC Orders ED Discharge Orders    None       Luna Fuse, MD 02/19/21 2212

## 2021-02-19 NOTE — ED Notes (Signed)
Pt stepped outside.  

## 2021-02-19 NOTE — ED Notes (Signed)
Pt came back from outside.

## 2021-02-20 LAB — TYPE AND SCREEN
ABO/RH(D): O POS
Antibody Screen: NEGATIVE
Unit division: 0

## 2021-02-20 LAB — BPAM RBC
Blood Product Expiration Date: 202205232359
ISSUE DATE / TIME: 202204272052
Unit Type and Rh: 5100

## 2021-05-12 ENCOUNTER — Encounter: Payer: Self-pay | Admitting: Emergency Medicine

## 2021-05-12 ENCOUNTER — Ambulatory Visit
Admission: EM | Admit: 2021-05-12 | Discharge: 2021-05-12 | Disposition: A | Discharge status: Home / Self Care | Payer: Self-pay | Attending: Physician Assistant | Admitting: Physician Assistant

## 2021-05-12 ENCOUNTER — Other Ambulatory Visit: Payer: Self-pay

## 2021-05-12 DIAGNOSIS — L03011 Cellulitis of right finger: Secondary | ICD-10-CM

## 2021-05-12 MED ORDER — SULFAMETHOXAZOLE-TRIMETHOPRIM 800-160 MG PO TABS: 1.0000 | ORAL_TABLET | Freq: Two times a day (BID) | ORAL | 0 refills | Status: DC

## 2021-05-12 NOTE — Discharge Instructions (Addendum)
Return if any problems.

## 2021-05-12 NOTE — ED Triage Notes (Signed)
Pt here with right thumb swelling and discoloration. States she tried to get a hang nail out and put peroxide on it and it got worse.

## 2021-05-12 NOTE — ED Provider Notes (Signed)
[facility]    CSN: [id number] Arrival date & time: 05/12/21  1062      History   Chief Complaint Chief Complaint  Patient presents with   Finger Injury    HPI Judith Lowe is a 40 y.o. female.   Pt complains of swelling to her right thumb.  No fever or chills   The history is provided by the patient. No language interpreter was used.  Hand Pain This is a new problem. The problem occurs constantly. The problem has been gradually worsening. She has tried nothing for the symptoms. The treatment provided no relief.   Past Medical History:  Diagnosis Date   Drug use    ETOH abuse    Medical history non-contributory     Patient Active Problem List   Diagnosis Date Noted   Sad mood 09/07/2019   Secondary amenorrhea 09/07/2019   Pelvic pain 09/07/2019    Past Surgical History:  Procedure Laterality Date   NO PAST SURGERIES      OB History     Gravida  3   Para  2   Term  2   Preterm      AB  1   Living  2      SAB  1   IAB      Ectopic      Multiple      Live Births  2            Home Medications    Prior to Admission medications   Medication Sig Start Date End Date Taking? Authorizing Provider  ibuprofen (ADVIL) 800 MG tablet Take 1 tablet (800 mg total) by mouth 3 (three) times daily. 08/19/19   Montine Circle, PA-C  Ibuprofen-diphenhydrAMINE Cit 200-38 MG TABS Take 2 tablets by mouth at bedtime as needed (sleep and pain).    [provider]  oxyCODONE (OXY IR/ROXICODONE) 5 MG immediate release tablet Take 1 tablet (5 mg total) by mouth every 6 (six) hours as needed for severe pain. 08/16/20   Carlisle Cater, PA-C  sertraline (ZOLOFT) 50 MG tablet Take 1 tablet (50 mg total) by mouth daily. 09/07/19   Donnamae Jude, MD    Family History History reviewed. No pertinent family history.  Social History Social History   Tobacco Use   Smoking status: Every Day    Packs/day: 0.25    Years: 5.00    Pack years:  1.25    Types: Cigarettes   Smokeless tobacco: Never  Substance Use Topics   Alcohol use: Yes    Alcohol/week: 1.0 standard drink    Types: 1 Glasses of wine per week    Comment: occaisionally; once every two weeks    Drug use: Yes    Types: Marijuana    Comment: pt reports smoking everyday     Allergies   Patient has no known allergies.   Review of Systems Review of Systems  Musculoskeletal:  Positive for joint swelling.  All other systems reviewed and are negative.   Physical Exam Triage Vital Signs ED Triage Vitals  Enc Vitals Group     BP 05/12/21 0953 (!) 157/95     Pulse Rate 05/12/21 0953 (!) 101     Resp 05/12/21 0953 20     Temp 05/12/21 0953 97.9 F (36.6 C)     Temp Source 05/12/21 0953 Oral     SpO2 05/12/21 0953 98 %     Weight 05/12/21 0954 131 lb (59.4 kg)  Height 05/12/21 0954 5\' 9"  (1.753 m)     Head Circumference --      Peak Flow --      Pain Score 05/12/21 0954 8     Pain Loc --      Pain Edu? --      Excl. in [place]? --    No data found.  Updated Vital Signs BP (!) 157/95   Pulse (!) 101   Temp 97.9 F (36.6 C) (Oral)   Resp 20   Ht 5\' 9"  (1.753 m)   Wt 59.4 kg   SpO2 98%   BMI 19.35 kg/m   Visual Acuity Right Eye Distance:   Left Eye Distance:   Bilateral Distance:    Right Eye Near:   Left Eye Near:    Bilateral Near:     Physical Exam Vitals reviewed.  Constitutional:      Appearance: Normal appearance.  Musculoskeletal:        General: Swelling and tenderness present.     Comments: Swollen distal right thumb.  Nv and ns intact   Skin:    General: Skin is warm.  Neurological:     General: No focal deficit present.     Mental Status: She is alert.  Psychiatric:        Mood and Affect: Mood normal.     UC Treatments / Results  Labs (all labs ordered are listed, but only abnormal results are displayed) Labs Reviewed - No data to display  EKG   Radiology No results found.  Procedures Incision and  Drainage  Date/Time: 05/12/2021 10:43 AM Performed by: Fransico Meadow, PA-C Authorized by: Fransico Meadow, PA-C   Consent:    Consent obtained:  Verbal   Consent given by:  Patient   Risks discussed:  Bleeding, incomplete drainage, pain and damage to other organs   Alternatives discussed:  No treatment Universal protocol:    Procedure explained and questions answered to patient or proxy's satisfaction: yes     Relevant documents present and verified: yes     Test results available : yes     Imaging studies available: yes     Required blood products, implants, devices, and special equipment available: yes     Site/side marked: yes     Immediately prior to procedure, a time out was called: yes     Patient identity confirmed:  Verbally with patient Location:    Type:  Abscess   Location:  Upper extremity   Upper extremity location:  Finger   Finger location:  R thumb Pre-procedure details:    Skin preparation:  Betadine Anesthesia:    Anesthesia method:  Local infiltration   Local anesthetic:  Lidocaine 1% WITH epi Procedure type:    Complexity:  Simple Procedure details:    Incision types:  Single straight   Incision depth:  Subcutaneous   Wound management:  Probed and deloculated and irrigated with saline   Drainage:  Purulent   Drainage amount:  Moderate Post-procedure details:    Procedure completion:  Tolerated well, no immediate complications (including critical care time)  Medications Ordered in UC Medications - No data to display  Initial Impression / Assessment and Plan / UC Course  I have reviewed the triage vital signs and the nursing notes.  Pertinent labs & imaging results that were available during my care of the patient were reviewed by me and considered in my medical decision making (see chart for details).  MDM:  Pt advised to soak area 20 minutes 4 times a day  Final Clinical Impressions(s) / UC Diagnoses   Final diagnoses:  Paronychia of  finger, right     Discharge Instructions      Return if any problems     ED Prescriptions   None    PDMP not reviewed this encounter. An After Visit Summary was printed and given to the patient.    Fransico Meadow, [place] 05/12/21 1046

## 2021-10-06 ENCOUNTER — Emergency Department (HOSPITAL_COMMUNITY): Payer: Medicaid Other

## 2021-10-06 ENCOUNTER — Inpatient Hospital Stay (HOSPITAL_COMMUNITY): Payer: Medicaid Other

## 2021-10-06 ENCOUNTER — Other Ambulatory Visit: Payer: Self-pay

## 2021-10-06 ENCOUNTER — Inpatient Hospital Stay (HOSPITAL_COMMUNITY)
Admission: EM | Admit: 2021-10-06 | Discharge: 2021-10-09 | DRG: 286 | Disposition: A | Discharge status: Home / Self Care | Payer: Medicaid Other | Attending: Internal Medicine | Admitting: Internal Medicine

## 2021-10-06 ENCOUNTER — Encounter (HOSPITAL_COMMUNITY): Payer: Self-pay | Admitting: Emergency Medicine

## 2021-10-06 DIAGNOSIS — E069 Thyroiditis, unspecified: Secondary | ICD-10-CM | POA: Diagnosis present

## 2021-10-06 DIAGNOSIS — Z8249 Family history of ischemic heart disease and other diseases of the circulatory system: Secondary | ICD-10-CM | POA: Diagnosis not present

## 2021-10-06 DIAGNOSIS — R140 Abdominal distension (gaseous): Secondary | ICD-10-CM | POA: Diagnosis present

## 2021-10-06 DIAGNOSIS — I160 Hypertensive urgency: Secondary | ICD-10-CM | POA: Diagnosis present

## 2021-10-06 DIAGNOSIS — I248 Other forms of acute ischemic heart disease: Secondary | ICD-10-CM | POA: Diagnosis present

## 2021-10-06 DIAGNOSIS — I509 Heart failure, unspecified: Secondary | ICD-10-CM

## 2021-10-06 DIAGNOSIS — D508 Other iron deficiency anemias: Secondary | ICD-10-CM

## 2021-10-06 DIAGNOSIS — F1210 Cannabis abuse, uncomplicated: Secondary | ICD-10-CM | POA: Diagnosis present

## 2021-10-06 DIAGNOSIS — F1410 Cocaine abuse, uncomplicated: Secondary | ICD-10-CM | POA: Diagnosis present

## 2021-10-06 DIAGNOSIS — F1010 Alcohol abuse, uncomplicated: Secondary | ICD-10-CM | POA: Diagnosis present

## 2021-10-06 DIAGNOSIS — F17210 Nicotine dependence, cigarettes, uncomplicated: Secondary | ICD-10-CM | POA: Diagnosis present

## 2021-10-06 DIAGNOSIS — D509 Iron deficiency anemia, unspecified: Secondary | ICD-10-CM | POA: Diagnosis present

## 2021-10-06 DIAGNOSIS — Z681 Body mass index (BMI) 19 or less, adult: Secondary | ICD-10-CM | POA: Diagnosis not present

## 2021-10-06 DIAGNOSIS — I5021 Acute systolic (congestive) heart failure: Secondary | ICD-10-CM | POA: Diagnosis present

## 2021-10-06 DIAGNOSIS — R7303 Prediabetes: Secondary | ICD-10-CM | POA: Diagnosis present

## 2021-10-06 DIAGNOSIS — I447 Left bundle-branch block, unspecified: Secondary | ICD-10-CM | POA: Diagnosis present

## 2021-10-06 DIAGNOSIS — Z560 Unemployment, unspecified: Secondary | ICD-10-CM

## 2021-10-06 DIAGNOSIS — R634 Abnormal weight loss: Secondary | ICD-10-CM | POA: Diagnosis present

## 2021-10-06 DIAGNOSIS — I428 Other cardiomyopathies: Secondary | ICD-10-CM | POA: Diagnosis present

## 2021-10-06 DIAGNOSIS — R0902 Hypoxemia: Secondary | ICD-10-CM | POA: Diagnosis present

## 2021-10-06 DIAGNOSIS — E0500 Thyrotoxicosis with diffuse goiter without thyrotoxic crisis or storm: Secondary | ICD-10-CM | POA: Diagnosis present

## 2021-10-06 DIAGNOSIS — Z79899 Other long term (current) drug therapy: Secondary | ICD-10-CM | POA: Diagnosis not present

## 2021-10-06 DIAGNOSIS — Z20822 Contact with and (suspected) exposure to covid-19: Secondary | ICD-10-CM | POA: Diagnosis present

## 2021-10-06 DIAGNOSIS — I3139 Other pericardial effusion (noninflammatory): Secondary | ICD-10-CM | POA: Diagnosis present

## 2021-10-06 DIAGNOSIS — R54 Age-related physical debility: Secondary | ICD-10-CM | POA: Diagnosis present

## 2021-10-06 DIAGNOSIS — I10 Essential (primary) hypertension: Secondary | ICD-10-CM

## 2021-10-06 DIAGNOSIS — I110 Hypertensive heart disease with heart failure: Secondary | ICD-10-CM | POA: Diagnosis present

## 2021-10-06 DIAGNOSIS — E0590 Thyrotoxicosis, unspecified without thyrotoxic crisis or storm: Secondary | ICD-10-CM

## 2021-10-06 DIAGNOSIS — F1910 Other psychoactive substance abuse, uncomplicated: Secondary | ICD-10-CM | POA: Diagnosis present

## 2021-10-06 DIAGNOSIS — E876 Hypokalemia: Secondary | ICD-10-CM | POA: Diagnosis present

## 2021-10-06 LAB — CBC WITH DIFFERENTIAL/PLATELET
Abs Immature Granulocytes: 0.02 10*3/uL (ref 0.00–0.07)
Basophils Absolute: 0 10*3/uL (ref 0.0–0.1)
Basophils Relative: 0 %
Eosinophils Absolute: 0.1 10*3/uL (ref 0.0–0.5)
Eosinophils Relative: 3 %
HCT: 26.3 % — ABNORMAL LOW (ref 36.0–46.0)
Hemoglobin: 7.4 g/dL — ABNORMAL LOW (ref 12.0–15.0)
Immature Granulocytes: 1 %
Lymphocytes Relative: 37 %
Lymphs Abs: 1.6 10*3/uL (ref 0.7–4.0)
MCH: 19 pg — ABNORMAL LOW (ref 26.0–34.0)
MCHC: 28.1 g/dL — ABNORMAL LOW (ref 30.0–36.0)
MCV: 67.6 fL — ABNORMAL LOW (ref 80.0–100.0)
Monocytes Absolute: 0.7 10*3/uL (ref 0.1–1.0)
Monocytes Relative: 15 %
Neutro Abs: 1.9 10*3/uL (ref 1.7–7.7)
Neutrophils Relative %: 44 %
Platelets: 280 10*3/uL (ref 150–400)
RBC: 3.89 MIL/uL (ref 3.87–5.11)
RDW: 22.5 % — ABNORMAL HIGH (ref 11.5–15.5)
WBC: 4.4 10*3/uL (ref 4.0–10.5)
nRBC: 0.5 % — ABNORMAL HIGH (ref 0.0–0.2)

## 2021-10-06 LAB — TYPE AND SCREEN
ABO/RH(D): O POS
Antibody Screen: NEGATIVE

## 2021-10-06 LAB — I-STAT ARTERIAL BLOOD GAS, ED
Acid-Base Excess: 3 mmol/L — ABNORMAL HIGH (ref 0.0–2.0)
Bicarbonate: 25.6 mmol/L (ref 20.0–28.0)
Calcium, Ion: 1.18 mmol/L (ref 1.15–1.40)
HCT: 25 % — ABNORMAL LOW (ref 36.0–46.0)
Hemoglobin: 8.5 g/dL — ABNORMAL LOW (ref 12.0–15.0)
O2 Saturation: 99 %
Patient temperature: 98.9
Potassium: 3.3 mmol/L — ABNORMAL LOW (ref 3.5–5.1)
Sodium: 139 mmol/L (ref 135–145)
TCO2: 27 mmol/L (ref 22–32)
pCO2 arterial: 32 mmHg (ref 32.0–48.0)
pH, Arterial: 7.511 — ABNORMAL HIGH (ref 7.350–7.450)
pO2, Arterial: 123 mmHg — ABNORMAL HIGH (ref 83.0–108.0)

## 2021-10-06 LAB — I-STAT BETA HCG BLOOD, ED (MC, WL, AP ONLY): I-stat hCG, quantitative: <5 m[IU]/mL (ref <5)

## 2021-10-06 LAB — HEMOGLOBIN A1C
Hgb A1c MFr Bld: 5.7 % — ABNORMAL HIGH (ref 4.8–5.6)
Mean Plasma Glucose: 116.89 mg/dL

## 2021-10-06 LAB — LIPID PANEL
Cholesterol: 94 mg/dL (ref 0–200)
HDL: 31 mg/dL — ABNORMAL LOW (ref >40)
LDL Cholesterol: 52 mg/dL (ref 0–99)
Total CHOL/HDL Ratio: 3 ratio
Triglycerides: 54 mg/dL (ref <150)
VLDL: 11 mg/dL (ref 0–40)

## 2021-10-06 LAB — HIV ANTIBODY (ROUTINE TESTING W REFLEX): HIV Screen 4th Generation wRfx: NONREACTIVE

## 2021-10-06 LAB — ECHOCARDIOGRAM COMPLETE
AR max vel: 2.8 cm2
AV Area VTI: 2.92 cm2
AV Area mean vel: 2.87 cm2
AV Mean grad: 11.5 mmHg
AV Peak grad: 20.8 mmHg
Ao pk vel: 2.28 m/s
Area-P 1/2: 7.59 cm2
Calc EF: 43.2 %
MV M vel: 5.8 m/s
MV Peak grad: 134.3 mmHg
S' Lateral: 3.8 cm
Single Plane A2C EF: 41.3 %
Single Plane A4C EF: 42.3 %
Weight: 2095.25 oz

## 2021-10-06 LAB — IRON AND TIBC
Iron: 21 ug/dL — ABNORMAL LOW (ref 28–170)
Saturation Ratios: 4 % — ABNORMAL LOW (ref 10.4–31.8)
TIBC: 483 ug/dL — ABNORMAL HIGH (ref 250–450)
UIBC: 462 ug/dL

## 2021-10-06 LAB — COMPREHENSIVE METABOLIC PANEL WITH GFR
ALT: 23 U/L (ref 0–44)
AST: 27 U/L (ref 15–41)
Albumin: 3.2 g/dL — ABNORMAL LOW (ref 3.5–5.0)
Alkaline Phosphatase: 86 U/L (ref 38–126)
Anion gap: 9 (ref 5–15)
BUN: 10 mg/dL (ref 6–20)
CO2: 25 mmol/L (ref 22–32)
Calcium: 8.8 mg/dL — ABNORMAL LOW (ref 8.9–10.3)
Chloride: 102 mmol/L (ref 98–111)
Creatinine, Ser: 0.42 mg/dL — ABNORMAL LOW (ref 0.44–1.00)
GFR, Estimated: >60 mL/min (ref >60)
Glucose, Bld: 97 mg/dL (ref 70–99)
Potassium: 3.4 mmol/L — ABNORMAL LOW (ref 3.5–5.1)
Sodium: 136 mmol/L (ref 135–145)
Total Bilirubin: 0.5 mg/dL (ref 0.3–1.2)
Total Protein: 6.8 g/dL (ref 6.5–8.1)

## 2021-10-06 LAB — FERRITIN: Ferritin: 6 ng/mL — ABNORMAL LOW (ref 11–307)

## 2021-10-06 LAB — URINALYSIS, ROUTINE W REFLEX MICROSCOPIC
Bilirubin Urine: NEGATIVE
Glucose, UA: NEGATIVE mg/dL
Hgb urine dipstick: NEGATIVE
Ketones, ur: NEGATIVE mg/dL
Leukocytes,Ua: NEGATIVE
Nitrite: NEGATIVE
Protein, ur: NEGATIVE mg/dL
Specific Gravity, Urine: 1.008 (ref 1.005–1.030)
pH: 7 (ref 5.0–8.0)

## 2021-10-06 LAB — RAPID URINE DRUG SCREEN, HOSP PERFORMED
Amphetamines: NOT DETECTED
Barbiturates: NOT DETECTED
Benzodiazepines: NOT DETECTED
Cocaine: POSITIVE — AB
Opiates: NOT DETECTED
Tetrahydrocannabinol: POSITIVE — AB

## 2021-10-06 LAB — I-STAT VENOUS BLOOD GAS, ED
Acid-Base Excess: 2 mmol/L (ref 0.0–2.0)
Bicarbonate: 26.4 mmol/L (ref 20.0–28.0)
Calcium, Ion: 1.18 mmol/L (ref 1.15–1.40)
HCT: 28 % — ABNORMAL LOW (ref 36.0–46.0)
Hemoglobin: 9.5 g/dL — ABNORMAL LOW (ref 12.0–15.0)
O2 Saturation: 93 %
Potassium: 3.4 mmol/L — ABNORMAL LOW (ref 3.5–5.1)
Sodium: 140 mmol/L (ref 135–145)
TCO2: 28 mmol/L (ref 22–32)
pCO2, Ven: 41.2 mmHg — ABNORMAL LOW (ref 44.0–60.0)
pH, Ven: 7.415 (ref 7.250–7.430)
pO2, Ven: 65 mmHg — ABNORMAL HIGH (ref 32.0–45.0)

## 2021-10-06 LAB — FOLATE: Folate: 20 ng/mL (ref >5.9)

## 2021-10-06 LAB — VITAMIN B12: Vitamin B-12: 827 pg/mL (ref 180–914)

## 2021-10-06 LAB — RESP PANEL BY RT-PCR (FLU A&B, COVID) ARPGX2
Influenza A by PCR: NEGATIVE
Influenza B by PCR: NEGATIVE
SARS Coronavirus 2 by RT PCR: NEGATIVE

## 2021-10-06 LAB — RETICULOCYTES
Immature Retic Fract: 27.5 % — ABNORMAL HIGH (ref 2.3–15.9)
RBC.: 3.69 MIL/uL — ABNORMAL LOW (ref 3.87–5.11)
Retic Count, Absolute: 66.1 K/uL (ref 19.0–186.0)
Retic Ct Pct: 1.8 % (ref 0.4–3.1)

## 2021-10-06 LAB — LACTIC ACID, PLASMA: Lactic Acid, Venous: 1.3 mmol/L (ref 0.5–1.9)

## 2021-10-06 LAB — TSH: TSH: <0.01 u[IU]/mL — ABNORMAL LOW (ref 0.350–4.500)

## 2021-10-06 LAB — TROPONIN I (HIGH SENSITIVITY)
Troponin I (High Sensitivity): 43 ng/L — ABNORMAL HIGH (ref <18)
Troponin I (High Sensitivity): 44 ng/L — ABNORMAL HIGH (ref <18)

## 2021-10-06 LAB — APTT: aPTT: 27 seconds (ref 24–36)

## 2021-10-06 LAB — POC OCCULT BLOOD, ED
Fecal Occult Bld: NEGATIVE
Fecal Occult Bld: NEGATIVE

## 2021-10-06 LAB — PROTIME-INR
INR: 1.2 (ref 0.8–1.2)
Prothrombin Time: 14.8 s (ref 11.4–15.2)

## 2021-10-06 LAB — BRAIN NATRIURETIC PEPTIDE: B Natriuretic Peptide: 2369.4 pg/mL — ABNORMAL HIGH (ref 0.0–100.0)

## 2021-10-06 MED ORDER — HYDRALAZINE HCL 20 MG/ML IJ SOLN: 5.0000 mg | INTRAMUSCULAR | Status: DC | PRN

## 2021-10-06 MED ORDER — MORPHINE SULFATE (PF) 2 MG/ML IV SOLN: 2.0000 mg | INTRAVENOUS | Status: DC | PRN

## 2021-10-06 MED ORDER — SODIUM CHLORIDE 0.9% FLUSH
3.0000 mL | Freq: Two times a day (BID) | INTRAVENOUS | Status: DC
  Administered 2021-10-06 – 2021-10-08 (×4): 3 mL via INTRAVENOUS

## 2021-10-06 MED ORDER — SACUBITRIL-VALSARTAN 24-26 MG PO TABS
1.0000 | ORAL_TABLET | Freq: Two times a day (BID) | ORAL | Status: DC
  Administered 2021-10-06 – 2021-10-07 (×2): 1 via ORAL
  Filled 2021-10-06 (×2): qty 1

## 2021-10-06 MED ORDER — SPIRONOLACTONE 12.5 MG HALF TABLET
12.5000 mg | ORAL_TABLET | Freq: Every day | ORAL | Status: DC
  Administered 2021-10-06 – 2021-10-07 (×2): 12.5 mg via ORAL
  Filled 2021-10-06 (×3): qty 1

## 2021-10-06 MED ORDER — TRAZODONE HCL 50 MG PO TABS: 25.0000 mg | ORAL_TABLET | Freq: Every evening | ORAL | Status: DC | PRN

## 2021-10-06 MED ORDER — SODIUM CHLORIDE 0.9% FLUSH
3.0000 mL | Freq: Two times a day (BID) | INTRAVENOUS | Status: DC
  Administered 2021-10-07 – 2021-10-08 (×2): 3 mL via INTRAVENOUS

## 2021-10-06 MED ORDER — NITROGLYCERIN IN D5W 200-5 MCG/ML-% IV SOLN
0.0000 ug/min | INTRAVENOUS | Status: DC
  Administered 2021-10-06: 10 ug/min via INTRAVENOUS
  Administered 2021-10-06 – 2021-10-07 (×2): 50 ug/min via INTRAVENOUS
  Administered 2021-10-07: 10 ug/min via INTRAVENOUS
  Administered 2021-10-07: 35 ug/min via INTRAVENOUS
  Administered 2021-10-07: 26.667 ug/min via INTRAVENOUS
  Administered 2021-10-07: 116.667 ug/min via INTRAVENOUS
  Administered 2021-10-07: 15 ug/min via INTRAVENOUS
  Filled 2021-10-06 (×3): qty 250

## 2021-10-06 MED ORDER — ONDANSETRON HCL 4 MG PO TABS: 4.0000 mg | ORAL_TABLET | Freq: Four times a day (QID) | ORAL | Status: DC | PRN

## 2021-10-06 MED ORDER — DOCUSATE SODIUM 100 MG PO CAPS
100.0000 mg | ORAL_CAPSULE | Freq: Two times a day (BID) | ORAL | Status: DC
  Administered 2021-10-06 – 2021-10-09 (×6): 100 mg via ORAL
  Filled 2021-10-06 (×6): qty 1

## 2021-10-06 MED ORDER — ASPIRIN EC 81 MG PO TBEC
81.0000 mg | DELAYED_RELEASE_TABLET | Freq: Every day | ORAL | Status: DC
  Administered 2021-10-07: 81 mg via ORAL
  Filled 2021-10-06 (×2): qty 1

## 2021-10-06 MED ORDER — SODIUM CHLORIDE 0.9 % IV SOLN: INTRAVENOUS | Status: DC

## 2021-10-06 MED ORDER — NITROGLYCERIN 0.4 MG SL SUBL
0.4000 mg | SUBLINGUAL_TABLET | SUBLINGUAL | Status: DC | PRN
  Administered 2021-10-06: 0.4 mg via SUBLINGUAL
  Filled 2021-10-06: qty 1

## 2021-10-06 MED ORDER — BISACODYL 5 MG PO TBEC: 5.0000 mg | DELAYED_RELEASE_TABLET | Freq: Every day | ORAL | Status: DC | PRN

## 2021-10-06 MED ORDER — OXYCODONE HCL 5 MG PO TABS: 5.0000 mg | ORAL_TABLET | ORAL | Status: DC | PRN

## 2021-10-06 MED ORDER — ASPIRIN 81 MG PO CHEW
324.0000 mg | CHEWABLE_TABLET | Freq: Once | ORAL | Status: AC
  Administered 2021-10-06: 324 mg via ORAL
  Filled 2021-10-06: qty 4

## 2021-10-06 MED ORDER — POLYETHYLENE GLYCOL 3350 17 G PO PACK: 17.0000 g | PACK | Freq: Every day | ORAL | Status: DC | PRN

## 2021-10-06 MED ORDER — ACETAMINOPHEN 650 MG RE SUPP: 650.0000 mg | Freq: Four times a day (QID) | RECTAL | Status: DC | PRN

## 2021-10-06 MED ORDER — ACETAMINOPHEN 325 MG PO TABS
650.0000 mg | ORAL_TABLET | Freq: Four times a day (QID) | ORAL | Status: DC | PRN
  Administered 2021-10-06 – 2021-10-07 (×2): 650 mg via ORAL
  Filled 2021-10-06 (×2): qty 2

## 2021-10-06 MED ORDER — FUROSEMIDE 10 MG/ML IJ SOLN
40.0000 mg | Freq: Once | INTRAMUSCULAR | Status: AC
  Administered 2021-10-06: 40 mg via INTRAVENOUS
  Filled 2021-10-06: qty 4

## 2021-10-06 MED ORDER — ENOXAPARIN SODIUM 40 MG/0.4ML IJ SOSY
40.0000 mg | PREFILLED_SYRINGE | INTRAMUSCULAR | Status: DC
  Administered 2021-10-06 – 2021-10-07 (×2): 40 mg via SUBCUTANEOUS
  Filled 2021-10-06 (×2): qty 0.4

## 2021-10-06 MED ORDER — FUROSEMIDE 10 MG/ML IJ SOLN
40.0000 mg | Freq: Two times a day (BID) | INTRAMUSCULAR | Status: DC
  Administered 2021-10-06 – 2021-10-08 (×4): 40 mg via INTRAVENOUS
  Filled 2021-10-06 (×4): qty 4

## 2021-10-06 MED ORDER — ONDANSETRON HCL 4 MG/2ML IJ SOLN: 4.0000 mg | Freq: Four times a day (QID) | INTRAMUSCULAR | Status: DC | PRN

## 2021-10-06 NOTE — H&P (Signed)
History and Physical    Judith Lowe DGL:[id number] DOB: Mar 23, 1981 DOA: 10/06/2021  PCP: Patient, No Pcp Per (Inactive) Consultants:  None Patient coming from:  Home - lives with daughter and boyfriend; NOK: Boyfriend, Judith Lowe, [place]  Chief Complaint: SOB  HPI: Judith Lowe is a 40 y.o. female with medical history significant of polysubstance abuse presenting with SOB, orthopnea. She was on BIPAP at the time of my evaluation.  She reported SOB x 2-3 days with mild cough.  She denied use of drugs and expressed surprise that her UDS was + for cocaine.  She did acknowledge marijuana.  Some chest tightness last night.      ED Course: [place]ry[place], per Dr. Marlowe Sax:  40 year old with history of drug and alcohol use presenting with new onset CHF and hypertensive urgency.  Shortness of breath for the past 2 days and had chest tightness last night.  Was in respiratory distress on arrival to the ED, tachypneic, and diaphoretic.  Chest x-ray showing findings consistent with CHF.  She was given Lasix and started on nitro drip and BiPAP.  EKG showing new left bundle branch block which was reviewed by cardiology and they felt that it did not meet STEMI criteria.  Troponin only mildly elevated at 43.  She was given aspirin.   Review of Systems: As per HPI; otherwise review of systems reviewed and negative.   Ambulatory Status:  Ambulates without assistance  COVID Vaccine Status:  Unknown  Past Medical History:  Diagnosis Date   Drug use    ETOH abuse     Past Surgical History:  Procedure Laterality Date   NO PAST SURGERIES      Social History   Socioeconomic History   Marital status: Single    Spouse name: Not on file   Number of children: Not on file   Years of education: Not on file   Highest education level: Not on file  Occupational History   Occupation: unemployed  Tobacco Use   Smoking status: Every Day    Packs/day: 0.25    Years: 5.00    Pack years: 1.25     Types: Cigarettes   Smokeless tobacco: Never  Substance and Sexual Activity   Alcohol use: Yes    Alcohol/week: 1.0 standard drink    Types: 1 Glasses of wine per week    Comment: occaisionally; once every two weeks    Drug use: Yes    Types: Marijuana, Cocaine    Comment: pt reports smoking everyday   Sexual activity: Yes    Birth control/protection: None  Other Topics Concern   Not on file  Social History Narrative   Not on file   Social Determinants of Health   Financial Resource Strain: Not on file  Food Insecurity: Not on file  Transportation Needs: Not on file  Physical Activity: Not on file  Stress: Not on file  Social Connections: Not on file  Intimate Partner Violence: Not on file    No Known Allergies  Family History  Problem Relation Age of Onset   Hypertension Mother     Prior to Admission medications   Medication Sig Start Date End Date Taking? Authorizing Provider  ibuprofen (ADVIL) 200 MG tablet Take 800 mg by mouth every 6 (six) hours as needed for mild pain or headache.   Yes [provider]  Phenyleph-Doxylamine-DM-APAP (ALKA SELTZER PLUS PO) Take 1 tablet by mouth 2 (two) times daily as needed (cough/congestion).   Yes [provider]  ibuprofen (  ADVIL) 800 MG tablet Take 1 tablet (800 mg total) by mouth 3 (three) times daily. Patient not taking: Reported on 10/06/2021 08/19/19   Montine Circle, PA-C  Ibuprofen-diphenhydrAMINE Cit 200-38 MG TABS Take 2 tablets by mouth at bedtime as needed (sleep and pain). Patient not taking: Reported on 10/06/2021    [provider]  oxyCODONE (OXY IR/ROXICODONE) 5 MG immediate release tablet Take 1 tablet (5 mg total) by mouth every 6 (six) hours as needed for severe pain. Patient not taking: Reported on 10/06/2021 08/16/20   Carlisle Cater, PA-C  sertraline (ZOLOFT) 50 MG tablet Take 1 tablet (50 mg total) by mouth daily. Patient not taking: Reported on 10/06/2021 09/07/19   Donnamae Jude, MD  sulfamethoxazole-trimethoprim (BACTRIM DS) 800-160 MG tablet Take 1 tablet by mouth 2 (two) times daily. Patient not taking: Reported on 10/06/2021 05/12/21   Sidney Ace    Physical Exam: Vitals:   10/06/21 1430 10/06/21 1500 10/06/21 1530 10/06/21 1600  BP: (!) 167/98 (!) 147/97 (!) 152/96 (!) 147/90  Pulse: (!) 104 (!) 102 (!) 108 (!) 106  Resp: (!) 30 (!) 29 (!) 38 (!) 36  Temp:      TempSrc:      SpO2: 100% 100% 100% 100%  Weight:         General:  Appears calm and comfortable and is in NAD, on BIPAP Eyes:  EOMI, normal lids, iris ENT:  grossly normal hearing, lips & tongue; BIPAP in place Neck:  no LAD, masses or thyromegaly Cardiovascular:  RR with mild tachycardia, no m/r/g. No LE edema.  Respiratory:   CTA bilaterally with no wheezes/rales/rhonchi.  Mildly increased respiratory effort on BIPAP. Abdomen:  soft, NT, ND Skin:  no rash or induration seen on limited exam Musculoskeletal:  grossly normal tone BUE/BLE, good ROM, no bony abnormality Psychiatric:  blunted mood and affect, speech sparse but appropriate, AOx3 Neurologic:  CN 2-12 grossly intact, moves all extremities in coordinated fashion    Radiological Exams on Admission: Independently reviewed - see discussion in A/P where applicable  DG Chest Port 1 View  Result Date: 10/06/2021 CLINICAL DATA:  Chest pain.  Shortness of breath. EXAM: PORTABLE CHEST 1 VIEW COMPARISON:  06/18/2013. FINDINGS: The heart is enlarged and the pulmonary vasculature is distended. Patchy airspace disease is present at the lung bases. There are small bilateral pleural effusions. No pneumothorax. No acute osseous abnormality. IMPRESSION: 1. Cardiomegaly with pulmonary vascular congestion. 2. Patchy airspace disease at the lung bases, possible edema, infiltrate, or atelectasis. 3. Small bilateral pleural effusions. Electronically Signed   By: Brett Fairy M.D.   On: 10/06/2021 04:14   ECHOCARDIOGRAM  COMPLETE  Result Date: 10/06/2021    ECHOCARDIOGRAM REPORT   Patient Name:   Judith Lowe Date of Exam: 10/06/2021 Medical Rec #:  [id number]   Height:       69.0 in Accession #:    [id number]  Weight:       131.0 lb Date of Birth:  [birth year]   BSA:          1.726 m Patient Age:    33 years    BP:           135/87 mmHg Patient Gender: F           HR:           999 bpm. Exam Location:  Inpatient Procedure: 2D Echo, Color Doppler and Cardiac Doppler Indications:    CHF  History:  Patient has no prior history of Echocardiogram examinations.  Sonographer:    Jyl Heinz Referring Phys: [place]  1. LVEF is depressed with hypokinesis in the inferoseptal, inferior walls . Left ventricular ejection fraction, by estimation, is 40 to 45%. The left ventricle has mildly decreased function. The left ventricular internal cavity size was mildly dilated. There is mild left ventricular hypertrophy. Indeterminate diastolic filling due to E-A fusion.  2. Right ventricular systolic function is low normal. The right ventricular size is normal.  3. Left atrial size was moderately dilated.  4. Large pleural effusion.  5. The mitral valve is normal in structure. Mild to moderate mitral valve regurgitation.  6. AV is thickened with minimally restricted motion. Mean gradient through the valve is 11 mm Hg. Marland Kitchen Aortic valve regurgitation is not visualized. Aortic valve sclerosis/calcification is present, without any evidence of aortic stenosis.  7. The inferior vena cava is dilated in size with <50% respiratory variability, suggesting right atrial pressure of 15 mmHg. FINDINGS  Left Ventricle: LVEF is depressed with hypokinesis in the inferoseptal, inferior walls. Left ventricular ejection fraction, by estimation, is 40 to 45%. The left ventricle has mildly decreased function. The left ventricular internal cavity size was mildly dilated. There is mild left ventricular hypertrophy. Indeterminate diastolic filling  due to E-A fusion. Right Ventricle: The right ventricular size is normal. Right vetricular wall thickness was not assessed. Right ventricular systolic function is low normal. Left Atrium: Left atrial size was moderately dilated. Right Atrium: Right atrial size was normal in size. Pericardium: Trivial pericardial effusion is present. Mitral Valve: The mitral valve is normal in structure. Mild to moderate mitral valve regurgitation. Tricuspid Valve: The tricuspid valve is normal in structure. Tricuspid valve regurgitation is mild. Aortic Valve: AV is thickened with minimally restricted motion. Mean gradient through the valve is 11 mm Hg. Aortic valve regurgitation is not visualized. Aortic valve sclerosis/calcification is present, without any evidence of aortic stenosis. Aortic valve mean gradient measures 11.5 mmHg. Aortic valve peak gradient measures 20.8 mmHg. Aortic valve area, by VTI measures 2.92 cm. Pulmonic Valve: The pulmonic valve was not well visualized. Pulmonic valve regurgitation is not visualized. Aorta: The aortic root and ascending aorta are structurally normal, with no evidence of dilitation. Venous: The inferior vena cava is dilated in size with less than 50% respiratory variability, suggesting right atrial pressure of 15 mmHg. IAS/Shunts: No atrial level shunt detected by color flow Doppler. Additional Comments: There is a large pleural effusion.  LEFT VENTRICLE PLAX 2D LVIDd:         5.50 cm      Diastology LVIDs:         3.80 cm      LV e' medial:    7.83 cm/s LV PW:         1.20 cm      LV E/e' medial:  15.8 LV IVS:        1.00 cm      LV e' lateral:   9.03 cm/s LVOT diam:     2.00 cm      LV E/e' lateral: 13.7 LV SV:         106 LV SV Index:   61 LVOT Area:     3.14 cm  LV Volumes (MOD) LV vol d, MOD A2C: 179.0 ml LV vol d, MOD A4C: 150.0 ml LV vol s, MOD A2C: 105.0 ml LV vol s, MOD A4C: 86.5 ml LV SV MOD A2C:  74.0 ml LV SV MOD A4C:     150.0 ml LV SV MOD BP:      72.9 ml RIGHT VENTRICLE              IVC RV Basal diam:  4.10 cm     IVC diam: 2.70 cm RV Mid diam:    2.70 cm RV S prime:     16.40 cm/s TAPSE (M-mode): 3.0 cm LEFT ATRIUM            Index        RIGHT ATRIUM           Index LA diam:      4.20 cm  2.43 cm/m   RA Area:     17.40 cm LA Vol (A2C): 103.0 ml 59.69 ml/m  RA Volume:   44.70 ml  25.90 ml/m  AORTIC VALVE AV Area (Vmax):    2.80 cm AV Area (Vmean):   2.87 cm AV Area (VTI):     2.92 cm AV Vmax:           228.00 cm/s AV Vmean:          162.000 cm/s AV VTI:            0.362 m AV Peak Grad:      20.8 mmHg AV Mean Grad:      11.5 mmHg LVOT Vmax:         203.00 cm/s LVOT Vmean:        148.000 cm/s LVOT VTI:          0.337 m LVOT/AV VTI ratio: 0.93  AORTA Ao Root diam: 2.60 cm Ao Asc diam:  2.70 cm MITRAL VALVE                TRICUSPID VALVE MV Area (PHT): 7.59 cm     TR Peak grad:   36.7 mmHg MV Decel Time: 100 msec     TR Vmax:        303.00 cm/s MR Peak grad: 134.3 mmHg MR Mean grad: 92.5 mmHg     SHUNTS MR Vmax:      579.50 cm/s   Systemic VTI:  0.34 m MR Vmean:     467.5 cm/s    Systemic Diam: 2.00 cm MV E velocity: 124.00 cm/s MV A velocity: 140.00 cm/s MV E/A ratio:  0.89 Dorris Carnes MD Electronically signed by Dorris Carnes MD Signature Date/Time: 10/06/2021/4:14:04 PM    Final     EKG: Independently reviewed.  Sinus tachycardia with rate 113; new LBBB   Labs on Admission: I have personally reviewed the available labs and imaging studies at the time of the admission.  Pertinent labs:   ABG: 7.511/32/123 K+ 3.4 Albumin 3.2 HS troponin 43 WBC 4.4 Hgb 7.4 - stable from 02/19/21 Lipids 94/31/52/54 UA unremarkable UDS +THC, cocaine Heme negative Upreg negative   Assessment/Plan Principal Problem:   Acute CHF ([place]) Active Problems:   Polysubstance abuse (HCC)   Iron deficiency anemia   Acute CHF -Patient without known h/o chronic CHF presenting with worsening SOB and hypoxia to 84% -CXR consistent with mild pulmonary edema -BNP not checked; will attempt  to add on -With abnl CXR in the setting of cocaine use, acute decompensated CHF seems probable as diagnosis -Will admit, as per the Emergency HF Mortality Risk Grade.  The patient has: severe pulmonary edema requiring new O2 therapy -Will request echocardiogram -Will start ASA -CHF order set utilized -Cardiology consulted -Was given Lasix 40 mg  x 1 in ER and will repeat with 40 mg IV BID -Continue BIPAP/[place] O2 as needed for now -Normal kidney function at this time, will follow -Mildly elevated HS troponin is likely related to demand ischemia; doubt ACS based on symptoms -She was started on NTG drip, will continue pending cardiology input  Polysubstance abuse -Patient with acknowledged marijuana use, less forthright about cocaine use -Cessation encouraged; this should be encouraged on an ongoing basis -UDS ordered and + for THC, cocaine  Anemia -MCV is <80 and so this is microcytic anemia -MCV is < 70 indicating probable iron deficiency -With an MCV <70, elevated RDW - iron deficiency can be presumed -Ferritin is <15, diagnostic of iron deficiency -Appears to be stable from 01/2021 -Will not plan to transfuse at this time but may benefit from iron infusion    Note: This patient has been tested and is negative for the novel coronavirus COVID-19.    DVT prophylaxis: Lovenox  Code Status:  Full Family Communication: None present; she did not ask that I speak with her partner at the time of admission Disposition Plan:  The patient is from: home  Anticipated d/c is to: home  Anticipated d/c date will depend on clinical response to treatment, likely 2-4 days  Patient is currently: acutely ill Consults called: Cardiology; TOC team; heart failure navigator Admission status: Admit - It is my clinical opinion that admission to INPATIENT is reasonable and necessary because this patient will require at least 2 midnights in the hospital to treat this condition based on the medical complexity  of the problems presented.  Given the aforementioned information, the predictability of an adverse outcome is felt to be significant.    Karmen Bongo MD [facility]   How to contact the [facility] Attending or Consulting provider [place] or covering provider during after hours [place], for this patient?  Check the care team in [facility] and look for a) attending/consulting [facility] provider listed and b) the [facility] team listed Log into [email or web address] and use [place]'s universal password to access. If you do not have the password, please contact the hospital operator. Locate the [facility] provider you are looking for under [facility] and page to a number that you can be directly reached. If you still have difficulty reaching the provider, please page the [facility] (Director on Call) for the Hospitalists listed on amion for assistance.   10/06/2021, 4:43 PM

## 2021-10-06 NOTE — Consult Note (Addendum)
Advanced Heart Failure Team Consult Note   Primary Physician: Patient, No Pcp Per (Inactive) PCP-Cardiologist:  None  Reason for Consultation: New Systolic Heart Failure   HPI:    Judith Lowe is seen today for evaluation of new systolic heart failure at the request of Dr. Lorin Mercy, Internal Medicine.   40 y/o AAF w/ no prior cardiac history. No routine medical care. +H/o ETOH and substance use.  Presented to [facility] ED w/ 2 day h/o SOB and Chest pain. In respiratory distress on arrival w/ hypoxia requiring BiPAP  + Hypertensive urgency. SBPs 180s/ DBPs 120s. CXR showed pulmonary edema. EKG showed sinus tach w/ new LBBB. Hs trop 43>>44. UDS + for cocaine and THC. CMP w/ K 3.4, SCr 0.42. LFTs normal. CBC w/ anemia. Hgb 7.4. Anemia w/u c/w IDA. FOBT negative.   Admitted by IM and started on IV Lasix + nitro gtt.   Echo shows moderately reduced LVEF, ~40%. RV normal. Mild MR and mild pericardial effusion. IVC dilated c/w fluid overload. (preliminary review, full report pending).   COVID and Flu negative. HIV test pending.   Though noted in chart, pt denies any heavy/habitual ETOH use to me. Reports she will have an "occasional beer". Admits to cocaine, marijuana and cigarette use.   No family h/o CHF, SCD to her knowledge.   Review of Systems: [y] = yes, [ ]  = no   General: Weight gain [ ] ; Weight loss [ ] ; Anorexia [ ] ; Fatigue [ ] ; Fever [ ] ; Chills [ ] ; Weakness [ ]   Cardiac: Chest pain/pressure [ Y]; Resting SOB [Y ]; Exertional SOB [Y ]; Orthopnea [ Y]; Pedal Edema [ ] ; Palpitations [ ] ; Syncope [ ] ; Presyncope [ ] ; Paroxysmal nocturnal dyspnea[ Y]  Pulmonary: Cough [ ] ; Wheezing[ ] ; Hemoptysis[ ] ; Sputum [ ] ; Snoring [ ]   GI: Vomiting[ ] ; Dysphagia[ ] ; Melena[ ] ; Hematochezia [ ] ; Heartburn[ ] ; Abdominal pain [ ] ; Constipation [ ] ; Diarrhea [ ] ; BRBPR [ ]   GU: Hematuria[ ] ; Dysuria [ ] ; Nocturia[ ]   Vascular: Pain in legs with walking [ ] ; Pain in feet with lying flat [ ] ; Non-healing  sores [ ] ; Stroke [ ] ; TIA [ ] ; Slurred speech [ ] ;  Neuro: Headaches[ ] ; Vertigo[ ] ; Seizures[ ] ; Paresthesias[ ] ;Blurred vision [ ] ; Diplopia [ ] ; Vision changes [ ]   Ortho/Skin: Arthritis [ ] ; Joint pain [ ] ; Muscle pain [ ] ; Joint swelling [ ] ; Back Pain [ ] ; Rash [ ]   Psych: Depression[ ] ; Anxiety[ ]   Heme: Bleeding problems [ ] ; Clotting disorders [ ] ; Anemia [Y ]  Endocrine: Diabetes [ ] ; Thyroid dysfunction[ ]   Home Medications Prior to Admission medications   Medication Sig Start Date End Date Taking? Authorizing Provider  ibuprofen (ADVIL) 200 MG tablet Take 800 mg by mouth every 6 (six) hours as needed for mild pain or headache.   Yes [provider]  Phenyleph-Doxylamine-DM-APAP (ALKA SELTZER PLUS PO) Take 1 tablet by mouth 2 (two) times daily as needed (cough/congestion).   Yes [provider]    Past Medical History: Past Medical History:  Diagnosis Date   Drug use    ETOH abuse     Past Surgical History: Past Surgical History:  Procedure Laterality Date   NO PAST SURGERIES      Family History: Family History  Problem Relation Age of Onset   Hypertension Mother     Social History: Social History   Socioeconomic History   Marital status: Single  Spouse name: Not on file   Number of children: Not on file   Years of education: Not on file   Highest education level: Not on file  Occupational History   Occupation: unemployed  Tobacco Use   Smoking status: Every Day    Packs/day: 0.25    Years: 5.00    Pack years: 1.25    Types: Cigarettes   Smokeless tobacco: Never  Substance and Sexual Activity   Alcohol use: Yes    Alcohol/week: 1.0 standard drink    Types: 1 Glasses of wine per week    Comment: occaisionally; once every two weeks    Drug use: Yes    Types: Marijuana, Cocaine    Comment: pt reports smoking everyday   Sexual activity: Yes    Birth control/protection: None  Other Topics Concern   Not on file  Social History  Narrative   Not on file   Social Determinants of Health   Financial Resource Strain: Not on file  Food Insecurity: Not on file  Transportation Needs: Not on file  Physical Activity: Not on file  Stress: Not on file  Social Connections: Not on file    Allergies:  No Known Allergies  Objective:    Vital Signs:   Temp:  [98.9 F (37.2 C)] 98.9 F (37.2 C) (12/12 0341) Pulse Rate:  [91-129] 106 (12/12 1600) Resp:  [18-38] 36 (12/12 1600) BP: (132-188)/(84-131) 147/90 (12/12 1600) SpO2:  [84 %-100 %] 100 % (12/12 1600) Weight:  [59.4 kg] 59.4 kg (12/12 0900)    Weight change: Filed Weights   10/06/21 0900  Weight: 59.4 kg    Intake/Output:   Intake/Output Summary (Last 24 hours) at 10/06/2021 1635 Last data filed at 10/06/2021 1511 Gross per 24 hour  Intake 137.23 ml  Output 1200 ml  Net -1062.77 ml      Physical Exam    General:  thin AAF, looks much older than actual age. No resp difficulty HEENT: normal Neck: supple. JVP elevated to jaw. Carotids 2+ bilat; no bruits. No lymphadenopathy or thyromegaly appreciated. Cor: PMI nondisplaced. Regular rhythm and tachy rate + gallop  Lungs: decreased BS at the base bilaterally  Abdomen: soft, nontender, nondistended. No hepatosplenomegaly. No bruits or masses. Good bowel sounds. Extremities: no cyanosis, clubbing, rash, edema Neuro: alert & orientedx3, cranial nerves grossly intact. moves all 4 extremities w/o difficulty. Affect pleasant   Telemetry   Sinus tach, low 100s   EKG    NSR 96 bpm  w/ LBBB   Labs   Basic Metabolic Panel: Recent Labs  Lab 10/06/21 0415 10/06/21 0432 10/06/21 0442  NA 136 140 139  K 3.4* 3.4* 3.3*  CL 102  --   --   CO2 25  --   --   GLUCOSE 97  --   --   BUN 10  --   --   CREATININE 0.42*  --   --   CALCIUM 8.8*  --   --     Liver Function Tests: Recent Labs  Lab 10/06/21 0415  AST 27  ALT 23  ALKPHOS 86  BILITOT 0.5  PROT 6.8  ALBUMIN 3.2*   No results for  input(s): LIPASE, AMYLASE in the last 168 hours. No results for input(s): AMMONIA in the last 168 hours.  CBC: Recent Labs  Lab 10/06/21 0415 10/06/21 0432 10/06/21 0442  WBC 4.4  --   --   NEUTROABS 1.9  --   --   HGB 7.4*  9.5* 8.5*  HCT 26.3* 28.0* 25.0*  MCV 67.6*  --   --   PLT 280  --   --     Cardiac Enzymes: No results for input(s): CKTOTAL, CKMB, CKMBINDEX, TROPONINI in the last 168 hours.  BNP: BNP (last 3 results) No results for input(s): BNP in the last 8760 hours.  ProBNP (last 3 results) No results for input(s): PROBNP in the last 8760 hours.   CBG: No results for input(s): GLUCAP in the last 168 hours.  Coagulation Studies: Recent Labs    10/06/21 0415  LABPROT 14.8  INR 1.2     Imaging   DG Chest Port 1 View  Result Date: 10/06/2021 CLINICAL DATA:  Chest pain.  Shortness of breath. EXAM: PORTABLE CHEST 1 VIEW COMPARISON:  06/18/2013. FINDINGS: The heart is enlarged and the pulmonary vasculature is distended. Patchy airspace disease is present at the lung bases. There are small bilateral pleural effusions. No pneumothorax. No acute osseous abnormality. IMPRESSION: 1. Cardiomegaly with pulmonary vascular congestion. 2. Patchy airspace disease at the lung bases, possible edema, infiltrate, or atelectasis. 3. Small bilateral pleural effusions. Electronically Signed   By: Brett Fairy M.D.   On: 10/06/2021 04:14   ECHOCARDIOGRAM COMPLETE  Result Date: 10/06/2021    ECHOCARDIOGRAM REPORT   Patient Name:   Judith Lowe Date of Exam: 10/06/2021 Medical Rec #:  [id number]   Height:       69.0 in Accession #:    [id number]  Weight:       131.0 lb Date of Birth:  1981/09/26   BSA:          1.726 m Patient Age:    13 years    BP:           135/87 mmHg Patient Gender: F           HR:           999 bpm. Exam Location:  Inpatient Procedure: 2D Echo, Color Doppler and Cardiac Doppler Indications:    CHF  History:        Patient has no prior history of Echocardiogram  examinations.  Sonographer:    Jyl Heinz Referring Phys: [place]  1. LVEF is depressed with hypokinesis in the inferoseptal, inferior walls . Left ventricular ejection fraction, by estimation, is 40 to 45%. The left ventricle has mildly decreased function. The left ventricular internal cavity size was mildly dilated. There is mild left ventricular hypertrophy. Indeterminate diastolic filling due to E-A fusion.  2. Right ventricular systolic function is low normal. The right ventricular size is normal.  3. Left atrial size was moderately dilated.  4. Large pleural effusion.  5. The mitral valve is normal in structure. Mild to moderate mitral valve regurgitation.  6. AV is thickened with minimally restricted motion. Mean gradient through the valve is 11 mm Hg. Marland Kitchen Aortic valve regurgitation is not visualized. Aortic valve sclerosis/calcification is present, without any evidence of aortic stenosis.  7. The inferior vena cava is dilated in size with <50% respiratory variability, suggesting right atrial pressure of 15 mmHg. FINDINGS  Left Ventricle: LVEF is depressed with hypokinesis in the inferoseptal, inferior walls. Left ventricular ejection fraction, by estimation, is 40 to 45%. The left ventricle has mildly decreased function. The left ventricular internal cavity size was mildly dilated. There is mild left ventricular hypertrophy. Indeterminate diastolic filling due to E-A fusion. Right Ventricle: The right ventricular size is normal. Right vetricular wall thickness was not assessed.  Right ventricular systolic function is low normal. Left Atrium: Left atrial size was moderately dilated. Right Atrium: Right atrial size was normal in size. Pericardium: Trivial pericardial effusion is present. Mitral Valve: The mitral valve is normal in structure. Mild to moderate mitral valve regurgitation. Tricuspid Valve: The tricuspid valve is normal in structure. Tricuspid valve regurgitation is mild.  Aortic Valve: AV is thickened with minimally restricted motion. Mean gradient through the valve is 11 mm Hg. Aortic valve regurgitation is not visualized. Aortic valve sclerosis/calcification is present, without any evidence of aortic stenosis. Aortic valve mean gradient measures 11.5 mmHg. Aortic valve peak gradient measures 20.8 mmHg. Aortic valve area, by VTI measures 2.92 cm. Pulmonic Valve: The pulmonic valve was not well visualized. Pulmonic valve regurgitation is not visualized. Aorta: The aortic root and ascending aorta are structurally normal, with no evidence of dilitation. Venous: The inferior vena cava is dilated in size with less than 50% respiratory variability, suggesting right atrial pressure of 15 mmHg. IAS/Shunts: No atrial level shunt detected by color flow Doppler. Additional Comments: There is a large pleural effusion.  LEFT VENTRICLE PLAX 2D LVIDd:         5.50 cm      Diastology LVIDs:         3.80 cm      LV e' medial:    7.83 cm/s LV PW:         1.20 cm      LV E/e' medial:  15.8 LV IVS:        1.00 cm      LV e' lateral:   9.03 cm/s LVOT diam:     2.00 cm      LV E/e' lateral: 13.7 LV SV:         106 LV SV Index:   61 LVOT Area:     3.14 cm  LV Volumes (MOD) LV vol d, MOD A2C: 179.0 ml LV vol d, MOD A4C: 150.0 ml LV vol s, MOD A2C: 105.0 ml LV vol s, MOD A4C: 86.5 ml LV SV MOD A2C:     74.0 ml LV SV MOD A4C:     150.0 ml LV SV MOD BP:      72.9 ml RIGHT VENTRICLE             IVC RV Basal diam:  4.10 cm     IVC diam: 2.70 cm RV Mid diam:    2.70 cm RV S prime:     16.40 cm/s TAPSE (M-mode): 3.0 cm LEFT ATRIUM            Index        RIGHT ATRIUM           Index LA diam:      4.20 cm  2.43 cm/m   RA Area:     17.40 cm LA Vol (A2C): 103.0 ml 59.69 ml/m  RA Volume:   44.70 ml  25.90 ml/m  AORTIC VALVE AV Area (Vmax):    2.80 cm AV Area (Vmean):   2.87 cm AV Area (VTI):     2.92 cm AV Vmax:           228.00 cm/s AV Vmean:          162.000 cm/s AV VTI:            0.362 m AV Peak Grad:       20.8 mmHg AV Mean Grad:      11.5 mmHg LVOT Vmax:  203.00 cm/s LVOT Vmean:        148.000 cm/s LVOT VTI:          0.337 m LVOT/AV VTI ratio: 0.93  AORTA Ao Root diam: 2.60 cm Ao Asc diam:  2.70 cm MITRAL VALVE                TRICUSPID VALVE MV Area (PHT): 7.59 cm     TR Peak grad:   36.7 mmHg MV Decel Time: 100 msec     TR Vmax:        303.00 cm/s MR Peak grad: 134.3 mmHg MR Mean grad: 92.5 mmHg     SHUNTS MR Vmax:      579.50 cm/s   Systemic VTI:  0.34 m MR Vmean:     467.5 cm/s    Systemic Diam: 2.00 cm MV E velocity: 124.00 cm/s MV A velocity: 140.00 cm/s MV E/A ratio:  0.89 Dorris Carnes MD Electronically signed by Dorris Carnes MD Signature Date/Time: 10/06/2021/4:14:04 PM    Final      Medications:     Current Medications:  docusate sodium  100 mg Oral BID   enoxaparin (LOVENOX) injection  40 mg Subcutaneous Q24H   furosemide  40 mg Intravenous BID   sodium chloride flush  3 mL Intravenous Q12H    Infusions:  nitroGLYCERIN 50 mcg/min (10/06/21 1511)      Assessment/Plan   Acute Systolic Heart Failure - pulmonary edema on CXR, IVC dilated on echo c/w volume overload  - Echo w/ moderately reduced LVEF ~40%, RV ok. No prior studies for comparison  - EKG shows new LBBB. HS trop 43>>44, not c/w ACS - suspect most likely NICM. 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 Cocaine and ETOH may also play a roll - Will need R/LHC this admit (Wednesday after diuresis)  - cMRI if cath unrevealing  - Start GDMT/afterload reduction  - Wean Nitro gtt  - Add Entresto 24-26 mg mg bid - Add Spiro 12.5 mg daily  - Continue IV lasix, 40 mg bid, for diuresis  - Caution w/ ? blocker given cocaine use - SGLT2i prior to d/c (Hgb A1c ok at 5.7)  - Bidil eventually if BP room  - Avoidance of cocaine and reduction in ETOH imperative   2. Hypertension/ Hypertensive Urgency  - markedly HTN on admit. Long standing history but not on meds PTA - also recent cocaine use - hypokalemic on admit. ? Hyperaldosteronism  -  check renin:aldosterone ratio prior to start ARNi/spiro  - continue nitro gtt  - initiate GDMT/Antihypertensives as outlined above   3. Iron Deficiency Anemia - Hgb 7.4 on admit - FOBT negative - Iron panel c/w IDA. Low Ferratin, Iron and Sats - needs feraheme (wait to give until after cMRI, can affect imaging)   4. Pre-Diabetes - Hgb A1c 5.7 - will need to establish care w/ PCP for further monitoring and preventative care  5. Polysubstance Use - UDS + for cocaine and THC - cessation from cocaine imperative    Medication concerns reviewed with patient and pharmacy team. Barriers identified: Uninsured. Concern for poor health literacy level and polysubstance abuse.   Will engage HF SW and pharmacy teams to help w/ insurance and medication assistance. May need meds through HF fund. May need paramedince to help w/ compliance.   Length of Stay: 0  Lyda Jester, PA-C  10/06/2021, 4:35 PM  Advanced Heart Failure Team Pager [phone] (M-F; 7a - 5p)  Please contact [facility] for night-coverage after hours (4p -  7a ) and weekends on [email or web address]  Patient seen with PA, agree with the above note.   Patient has history of untreated HTN and cocaine abuse.  She also smokes cigarettes. She was doing well per her report until about 3 days ago.  She developed dyspnea just walking around the house.  She developed significant orthopnea and was unable to lie down (sleeping in chair).  Too short of breath to smoke a cigarette.  She reports frequent chest heaviness/tightness.   She came to the ER.  CXR with pulmonary edema.  HS-TnI in 40s with no trend.  She is Fe deficient but FOBT negative.  +Cocaine on UDS. BP elevated in the ER, she was started on NTG gtt.  Takes no meds at home.  ECG with NSR, LBBB. LBBB is new but doubt STEMI equivalent with no uptrend on repeat troponin.   Echo reviewed, EF 40-45% with inferior/inferoseptal HK, low normal RV function, mild-moderate MR, elevated AoV  gradient at 11 mmHg but valve opens normally, dilated IVC, pleural effusion.   General: NAD Neck: JVP 14 cm, + thyromegaly.  Lungs: Clear to auscultation bilaterally with normal respiratory effort. CV: Nondisplaced PMI.  Heart regular S1/S2, no S3/S4, 1/6 SEM RUSB.  1+ ankle edema.   Abdomen: Soft, nontender, no hepatosplenomegaly, no distention.  Skin: Intact without lesions or rashes.  Neurologic: Alert and oriented x 3.  Psych: Normal affect. Extremities: No clubbing or cyanosis.  HEENT: Normal.   1. Acute systolic CHF: Echo this admission with EF 40-45% with inferior/inferoseptal HK, low normal RV function, mild-moderate MR, elevated AoV gradient at 11 mmHg but valve opens normally, dilated IVC, pleural effusion.  Patient abuses cocaine, also smokes and does drink some as well.  She has uncontrolled HTN.  No FH of CMP that she knows of.  Admitted with chest pressure but HS-TnI only mildly elevated with no trend, so doubt ACS.  Do not think new LBBB represents STEMI equivalent.  Cardiomyopathy could be due to longstanding HTN versus cocaine versus ischemic CMP (though probably less likely).  On exam, she is significantly volume overloaded.  - Start Lasix 40 mg IV bid.  - Start Entresto 24/26 bid.  - Start spironolactone 12.5 daily.  - Wean off NTG gtt.  - Needs to avoid cocaine and ETOH.  - Will arrange for LHC/RHC after diuresis (probably Wednesday), then cardiac MRI if unrevealing.  2. HTN: Long-standing.  Not treated in past, elevated today.  - Agree with checking renin/aldosterone and would also check renal artery dopplers for fibromuscular dysplasia.  - Check TSH, thyroid looks enlarged on exam.  3. Fe deficiency anemia: FOBT negative.  May be due to heavy menses.  - Will need feraheme, but would see first if she will need MRI (will interact with MRI).  4. Cocaine abuse: Counseled cessation.  5. Smoking: Counseled cessation.   Loralie Champagne 10/06/2021 5:06 PM

## 2021-10-06 NOTE — ED Provider Notes (Signed)
[facility] EMERGENCY DEPARTMENT Provider Note   CSN: [id number] Arrival date & time: 10/06/21  0335     History Chief Complaint  Patient presents with   SOB / Chest Tightness    Judith Lowe is a 40 y.o. female.  Level 5 caveat for acuity of condition.  Patient here with respiratory distress.  Seen in triage on arrival.  Reports shortness of breath overlying the past 2 days difficulty lying flat.  Has had chest tightness across her chest for the past several hours as well as a dry cough.  No fever. On arrival she is tachypneic to the 30s and diaphoretic complaining of chest pain.  EKG shows new LVH and left bundle branch block. She denies any cardiac history.  Denies any prescribed medications.  Denies any history of hypertension.  Does admit to marijuana use as well as cocaine use last month ago States she is never had a heart attack or any other heart issues.  Takes no prescribed medications.  The history is provided by the patient. The history is limited by the condition of the patient.      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  Social History   Tobacco Use   Smoking status: Every Day    Packs/day: 0.25    Years: 5.00    Pack years: 1.25    Types: Cigarettes   Smokeless tobacco: Never  Substance Use Topics   Alcohol use: Yes    Alcohol/week: 1.0 standard drink    Types: 1 Glasses of wine per week    Comment: occaisionally; once every two weeks    Drug use:  Yes    Types: Marijuana    Comment: pt reports smoking everyday    Home Medications Prior to Admission medications   Medication Sig Start Date End Date Taking? Authorizing Provider  ibuprofen (ADVIL) 800 MG tablet Take 1 tablet (800 mg total) by mouth 3 (three) times daily. 08/19/19   Montine Circle, PA-C  Ibuprofen-diphenhydrAMINE Cit 200-38 MG TABS Take 2 tablets by mouth at bedtime as needed (sleep and pain).    [provider]  oxyCODONE (OXY IR/ROXICODONE) 5 MG immediate release tablet Take 1 tablet (5 mg total) by mouth every 6 (six) hours as needed for severe pain. 08/16/20   Carlisle Cater, PA-C  sertraline (ZOLOFT) 50 MG tablet Take 1 tablet (50 mg total) by mouth daily. 09/07/19   Donnamae Jude, MD  sulfamethoxazole-trimethoprim (BACTRIM DS) 800-160 MG tablet Take 1 tablet by mouth 2 (two) times daily. 05/12/21   Fransico Meadow, PA-C    Allergies    Patient has no known allergies.  Review of Systems   Review of Systems  Constitutional:  Negative for activity change, appetite change and fever.  HENT:  Negative for congestion and rhinorrhea.   Respiratory:  Positive for cough, chest tightness and shortness of breath.   Cardiovascular:  Positive for chest pain.  Gastrointestinal:  Negative for abdominal pain, nausea and vomiting.  Genitourinary:  Negative for dysuria and hematuria.  Musculoskeletal:  Negative for arthralgias and myalgias.  Skin:  Negative for rash.  Neurological:  Negative for dizziness, weakness and headaches.   all other systems are negative except as noted in the HPI and PMH.   Physical Exam Updated Vital Signs BP (!) 163/122 (BP Location: Left Arm)   Pulse (!) 113   Temp 98.9 F (37.2 C) (Oral)   Resp (!) 22   SpO2 95%   Physical Exam Vitals and nursing note reviewed.  Constitutional:      General: She is in acute distress.     Appearance: She is well-developed. She is ill-appearing and diaphoretic.     Comments: Tachypneic in 30s.   Respiratory distress, diaphoresis  HENT:     Head: Normocephalic and atraumatic.     Mouth/Throat:     Pharynx: No oropharyngeal exudate.  Eyes:     Conjunctiva/sclera: Conjunctivae normal.     Pupils: Pupils are equal, round, and reactive to light.  Neck:     Comments: No meningismus. Cardiovascular:     Rate and Rhythm: Regular rhythm. Tachycardia present.     Heart sounds: Normal heart sounds. No murmur heard. Pulmonary:     Effort: Respiratory distress present.     Breath sounds: Rales present.  Abdominal:     Palpations: Abdomen is soft.     Tenderness: There is no abdominal tenderness. There is no guarding or rebound.  Musculoskeletal:        General: No tenderness. Normal range of motion.     Cervical back: Normal range of motion and neck supple.     Right lower leg: Edema present.     Left lower leg: Edema present.  Skin:    General: Skin is warm.  Neurological:     Mental Status: She is alert and oriented to person, place, and time.     Cranial Nerves: No cranial nerve deficit.     Motor: No abnormal muscle tone.     Coordination: Coordination normal.     Comments:  5/5 strength throughout. CN 2-12 intact.Equal grip strength.   Psychiatric:        Behavior: Behavior normal.    ED Results / Procedures / Treatments   Labs (all labs ordered are listed, but only abnormal results are displayed) Labs Reviewed  HEMOGLOBIN A1C - Abnormal; Notable for the following components:      Result Value   Hgb A1c MFr Bld 5.7 (*)    All other components within normal limits  CBC WITH DIFFERENTIAL/PLATELET - Abnormal; Notable for the following components:   Hemoglobin 7.4 (*)    HCT 26.3 (*)    MCV 67.6 (*)    MCH 19.0 (*)    MCHC 28.1 (*)    RDW 22.5 (*)    nRBC 0.5 (*)    All other components within normal limits  COMPREHENSIVE METABOLIC PANEL - Abnormal; Notable for the following components:   Potassium 3.4 (*)    Creatinine, Ser 0.42 (*)    Calcium 8.8 (*)     Albumin 3.2 (*)    All other components within normal  limits  LIPID PANEL - Abnormal; Notable for the following components:   HDL 31 (*)    All other components within normal limits  IRON AND TIBC - Abnormal; Notable for the following components:   Iron 21 (*)    TIBC 483 (*)    Saturation Ratios 4 (*)    All other components within normal limits  FERRITIN - Abnormal; Notable for the following components:   Ferritin 6 (*)    All other components within normal limits  RETICULOCYTES - Abnormal; Notable for the following components:   RBC. 3.69 (*)    Immature Retic Fract 27.5 (*)    All other components within normal limits  URINALYSIS, ROUTINE W REFLEX MICROSCOPIC - Abnormal; Notable for the following components:   Color, Urine STRAW (*)    All other components within normal limits  I-STAT ARTERIAL BLOOD GAS, ED - Abnormal; Notable for the following components:   pH, Arterial 7.511 (*)    pO2, Arterial 123 (*)    Acid-Base Excess 3.0 (*)    Potassium 3.3 (*)    HCT 25.0 (*)    Hemoglobin 8.5 (*)    All other components within normal limits  I-STAT VENOUS BLOOD GAS, ED - Abnormal; Notable for the following components:   pCO2, Ven 41.2 (*)    pO2, Ven 65.0 (*)    Potassium 3.4 (*)    HCT 28.0 (*)    Hemoglobin 9.5 (*)    All other components within normal limits  TROPONIN I (HIGH SENSITIVITY) - Abnormal; Notable for the following components:   Troponin I (High Sensitivity) 43 (*)    All other components within normal limits  RESP PANEL BY RT-PCR (FLU A&B, COVID) ARPGX2  PROTIME-INR  APTT  LACTIC ACID, PLASMA  VITAMIN B12  FOLATE  RAPID URINE DRUG SCREEN, HOSP PERFORMED  I-STAT BETA HCG BLOOD, ED ([facility], [facility], [facility] ONLY)  POC OCCULT BLOOD, ED  POC OCCULT BLOOD, ED  TYPE AND SCREEN  TROPONIN I (HIGH SENSITIVITY)    EKG EKG Interpretation  Date/Time:  Monday October 06 2021 03:45:43 EST Ventricular Rate:  113 PR Interval:  132 QRS Duration: 132 QT Interval:  380 QTC  Calculation: 521 R Axis:   -66 Text Interpretation: Sinus tachycardia Possible Left atrial enlargement Left axis deviation Left bundle branch block Abnormal ECG new LBBB Confirmed by Ezequiel Essex [phone]) on 10/06/2021 4:00:59 AM  Radiology DG Chest Port 1 View  Result Date: 10/06/2021 CLINICAL DATA:  Chest pain.  Shortness of breath. EXAM: PORTABLE CHEST 1 VIEW COMPARISON:  06/18/2013. FINDINGS: The heart is enlarged and the pulmonary vasculature is distended. Patchy airspace disease is present at the lung bases. There are small bilateral pleural effusions. No pneumothorax. No acute osseous abnormality. IMPRESSION: 1. Cardiomegaly with pulmonary vascular congestion. 2. Patchy airspace disease at the lung bases, possible edema, infiltrate, or atelectasis. 3. Small bilateral pleural effusions. Electronically Signed   By: Brett Fairy M.D.   On: 10/06/2021 04:14    Procedures .Critical Care Performed by: Ezequiel Essex, MD Authorized by: Ezequiel Essex, MD   Critical care provider statement:    Critical care time (minutes):  45   Critical care time was exclusive of:  Separately billable procedures and treating other patients   Critical care was necessary to treat or prevent imminent or life-threatening deterioration of the following conditions:  Respiratory failure and cardiac failure   Critical care was time spent personally by me on the following activities:  Development of treatment  plan with patient or surrogate, discussions with consultants, evaluation of patient's response to treatment, examination of patient, ordering and review of laboratory studies, ordering and review of radiographic studies, ordering and performing treatments and interventions, pulse oximetry, re-evaluation of patient's condition and review of old charts   I assumed direction of critical care for this patient from another provider in my specialty: yes     Care discussed with: admitting provider     Medications  Ordered in ED Medications  0.9 %  sodium chloride infusion (has no administration in time range)  aspirin chewable tablet 324 mg (has no administration in time range)  nitroGLYCERIN 50 mg in dextrose 5 % 250 mL (0.2 mg/mL) infusion (has no administration in time range)  nitroGLYCERIN (NITROSTAT) SL tablet 0.4 mg (has no administration in time range)  furosemide (LASIX) injection 40 mg (has no administration in time range)    ED Course  I have reviewed the triage vital signs and the nursing notes.  Pertinent labs & imaging results that were available during my care of the patient were reviewed by me and considered in my medical decision making (see chart for details).    MDM Rules/Calculators/A&P                          Patient arrives with respiratory distress and chest tightness.  Her EKG showed Nagpal branch block which is now.  She appears to be in acute CHF exacerbation given her hypertension, tachypnea and diaphoresis with increased work of breathing.  In setting of chest pain with diaphoresis and new left bundle branch block that was discussed with on-call interventional cardiologist Dr. Martinique.  He reviewed patient's EKG and does not feel this equate with STEMI activation at this time.  Recommends medical management of heart failure and cardiology consult as well as further workup.   Chest x-ray consistent with pulmonary edema.  She is initiated on IV nitroglycerin as well as IV Lasix.  He is given aspirin.  Labs are concerning for hemoglobin of 7.4 with a stable from April.  Patient reports she has had blood transfusions in the past attributed to NSAID use but never had endoscopies.  Denies any black or bloody stools.  Discussed with cardiology Dr. Clayton Bibles agrees with medical admission  ABG consistent with hyperventilation.  No significant CO2 retention.  Troponin minimally elevated likely secondary to hypertensive urgency.  Her anemia is stable since April and she denies any  recent black or bloody stools.  It may however be contributing to her dyspnea.  Admission for hypertensive urgency, new onset CHF and anemia.  Discussed with Dr. Marlowe Sax.  Final Clinical Impression(s) / ED Diagnoses Final diagnoses:  None    Rx / DC Orders ED Discharge Orders     None        Wesam Gearhart, Annie Main, MD 10/06/21 [phone]

## 2021-10-06 NOTE — ED Triage Notes (Signed)
Patient reports SOB and chest tightness worse when lying flat onset yesterday , occasional dry cough /no fever or chills .

## 2021-10-06 NOTE — Progress Notes (Signed)
   10/06/21 1200  Oxygen Therapy/Pulse Ox  O2 Device Nasal Cannula  O2 Therapy Oxygen  O2 Flow Rate (L/min) 3 L/min  SpO2 100 %   Pt off of BIPAP placed on 3lpm Judith Lowe

## 2021-10-07 ENCOUNTER — Other Ambulatory Visit (HOSPITAL_COMMUNITY): Payer: Self-pay

## 2021-10-07 ENCOUNTER — Inpatient Hospital Stay (HOSPITAL_COMMUNITY): Payer: Medicaid Other

## 2021-10-07 DIAGNOSIS — I5021 Acute systolic (congestive) heart failure: Secondary | ICD-10-CM

## 2021-10-07 DIAGNOSIS — E0590 Thyrotoxicosis, unspecified without thyrotoxic crisis or storm: Secondary | ICD-10-CM

## 2021-10-07 DIAGNOSIS — I10 Essential (primary) hypertension: Secondary | ICD-10-CM

## 2021-10-07 LAB — CBC
HCT: 24.5 % — ABNORMAL LOW (ref 36.0–46.0)
Hemoglobin: 7.2 g/dL — ABNORMAL LOW (ref 12.0–15.0)
MCH: 19.4 pg — ABNORMAL LOW (ref 26.0–34.0)
MCHC: 29.4 g/dL — ABNORMAL LOW (ref 30.0–36.0)
MCV: 65.9 fL — ABNORMAL LOW (ref 80.0–100.0)
Platelets: 257 10*3/uL (ref 150–400)
RBC: 3.72 MIL/uL — ABNORMAL LOW (ref 3.87–5.11)
RDW: 21.9 % — ABNORMAL HIGH (ref 11.5–15.5)
WBC: 5.5 10*3/uL (ref 4.0–10.5)
nRBC: 0.4 % — ABNORMAL HIGH (ref 0.0–0.2)

## 2021-10-07 LAB — BASIC METABOLIC PANEL
Anion gap: 9 (ref 5–15)
BUN: 7 mg/dL (ref 6–20)
CO2: 28 mmol/L (ref 22–32)
Calcium: 8.6 mg/dL — ABNORMAL LOW (ref 8.9–10.3)
Chloride: 98 mmol/L (ref 98–111)
Creatinine, Ser: 0.52 mg/dL (ref 0.44–1.00)
GFR, Estimated: >60 mL/min (ref >60)
Glucose, Bld: 166 mg/dL — ABNORMAL HIGH (ref 70–99)
Potassium: 3.1 mmol/L — ABNORMAL LOW (ref 3.5–5.1)
Sodium: 135 mmol/L (ref 135–145)

## 2021-10-07 LAB — T4, FREE: Free T4: 4 ng/dL — ABNORMAL HIGH (ref 0.61–1.12)

## 2021-10-07 LAB — MAGNESIUM: Magnesium: 1.4 mg/dL — ABNORMAL LOW (ref 1.7–2.4)

## 2021-10-07 MED ORDER — POTASSIUM CHLORIDE CRYS ER 20 MEQ PO TBCR
40.0000 meq | EXTENDED_RELEASE_TABLET | ORAL | Status: AC
  Administered 2021-10-07 (×2): 40 meq via ORAL
  Filled 2021-10-07 (×2): qty 2

## 2021-10-07 MED ORDER — SPIRONOLACTONE 12.5 MG HALF TABLET
12.5000 mg | ORAL_TABLET | Freq: Once | ORAL | Status: AC
  Administered 2021-10-07: 12.5 mg via ORAL

## 2021-10-07 MED ORDER — METHIMAZOLE 10 MG PO TABS
10.0000 mg | ORAL_TABLET | Freq: Three times a day (TID) | ORAL | Status: DC
  Administered 2021-10-07 – 2021-10-09 (×6): 10 mg via ORAL
  Filled 2021-10-07 (×8): qty 1

## 2021-10-07 MED ORDER — SODIUM CHLORIDE 0.9 % IV SOLN
510.0000 mg | Freq: Once | INTRAVENOUS | Status: AC
  Administered 2021-10-07: 510 mg via INTRAVENOUS
  Filled 2021-10-07: qty 17

## 2021-10-07 MED ORDER — SPIRONOLACTONE 25 MG PO TABS
25.0000 mg | ORAL_TABLET | Freq: Every day | ORAL | Status: DC
  Administered 2021-10-08 – 2021-10-09 (×2): 25 mg via ORAL
  Filled 2021-10-07 (×3): qty 1

## 2021-10-07 MED ORDER — SACUBITRIL-VALSARTAN 24-26 MG PO TABS
1.0000 | ORAL_TABLET | Freq: Once | ORAL | Status: AC
  Administered 2021-10-07: 1 via ORAL
  Filled 2021-10-07: qty 1

## 2021-10-07 MED ORDER — MAGNESIUM SULFATE 4 GM/100ML IV SOLN
4.0000 g | Freq: Once | INTRAVENOUS | Status: AC
Start: 2021-10-07 — End: 2021-10-07
  Administered 2021-10-07: 4 g via INTRAVENOUS
  Filled 2021-10-07: qty 100

## 2021-10-07 MED ORDER — POTASSIUM CHLORIDE CRYS ER 20 MEQ PO TBCR
40.0000 meq | EXTENDED_RELEASE_TABLET | Freq: Once | ORAL | Status: AC
  Administered 2021-10-07: 40 meq via ORAL
  Filled 2021-10-07: qty 2

## 2021-10-07 MED ORDER — SPIRONOLACTONE 12.5 MG HALF TABLET: 12.5000 mg | ORAL_TABLET | Freq: Once | ORAL | Status: DC

## 2021-10-07 MED ORDER — CARVEDILOL 3.125 MG PO TABS
3.1250 mg | ORAL_TABLET | Freq: Two times a day (BID) | ORAL | Status: DC
  Administered 2021-10-07 – 2021-10-09 (×5): 3.125 mg via ORAL
  Filled 2021-10-07 (×5): qty 1

## 2021-10-07 MED ORDER — SACUBITRIL-VALSARTAN 49-51 MG PO TABS
1.0000 | ORAL_TABLET | Freq: Two times a day (BID) | ORAL | Status: DC
  Administered 2021-10-07 – 2021-10-09 (×4): 1 via ORAL
  Filled 2021-10-07 (×4): qty 1

## 2021-10-07 NOTE — Progress Notes (Signed)
Patient declined BIPAP. No unit in room.

## 2021-10-07 NOTE — Progress Notes (Signed)
Heart Failure Navigator Progress Note  Assessed for Heart & Vascular TOC clinic readiness.  Patient does not meet criteria due to AHF rounding team consulted this hospitalization.   Navigator available for reassessment of patient.   Leigh Blas, MSN, RN Heart Failure Nurse Navigator [phone]   

## 2021-10-07 NOTE — TOC Benefit Eligibility Note (Addendum)
Patient Teacher, English as a foreign language completed.    The patient is currently admitted and upon discharge could be taking Entresto 24-26 mg.  The current 30 day co-pay is, $616.59 due to a $4,000.00 deductible remaining.   The patient is currently admitted and upon discharge could be taking Farxiga mg.  The current 30 day co-pay is, $531.97 due to a $4,000.00 deductible remaining.   The patient is currently admitted and upon discharge could be taking Jardiance 10 mg.  The current 30 day co-pay is, $552.95 due to a $4,000.00 deductible remaining.   The patient is insured through [place], [place] Patient Advocate Specialist [place] Patient Advocate Team Direct Number: [phone]  Fax: [phone]

## 2021-10-07 NOTE — Progress Notes (Addendum)
Advanced Heart Failure Rounding Note  PCP-Cardiologist: None   Subjective:     BP improving. Nitro gtt down to 35 mcg/min.  Down 10 lb overnight. -4.7L with IV lasix.   Scr stable. K 3.1.   TSH < 0.010  Feeling better. Orthopnea and PND improving. Less abdominal bloating.   Objective:   Weight Range: 53.3 kg Body mass index is 17.88 kg/m.   Vital Signs:   Temp:  [97.9 F (36.6 C)-98.3 F (36.8 C)] 98.1 F (36.7 C) (12/13 0500) Pulse Rate:  [94-111] 100 (12/13 0500) Resp:  [19-38] 25 (12/13 0500) BP: (132-167)/(84-101) 147/97 (12/13 0500) SpO2:  [98 %-100 %] 100 % (12/13 0500) Weight:  [53.3 kg-59.4 kg] 53.3 kg (12/13 0500) Last BM Date: 10/06/21  Weight change: Filed Weights   10/06/21 0900 10/06/21 1737 10/07/21 0500  Weight: 59.4 kg 57.7 kg 53.3 kg    Intake/Output:   Intake/Output Summary (Last 24 hours) at 10/07/2021 0807 Last data filed at 10/07/2021 0555 Gross per 24 hour  Intake 1683.33 ml  Output 5450 ml  Net -3766.67 ml      Physical Exam    General:  Thin AAF. No distress. Sitting up in bed.  HEENT: Normal Neck: Supple. JVP 10-12 cm. Carotids 2+ bilat; no bruits. Thryomegaly noted. Cor: PMI nondisplaced. Regular rate & rhythm, tachy. No rubs, gallops or murmurs. Lungs: Clear Abdomen: Soft, nontender, nondistended. No hepatosplenomegaly. No bruits or masses. Good bowel sounds. Extremities: No cyanosis, clubbing, rash, trace edema Neuro: Alert & orientedx3, cranial nerves grossly intact. moves all 4 extremities w/o difficulty. Affect pleasant   Telemetry   Sinus tachy 100s-110  Labs    CBC Recent Labs    10/06/21 0415 10/06/21 0432 10/06/21 0442 10/07/21 0039  WBC 4.4  --   --  5.5  NEUTROABS 1.9  --   --   --   HGB 7.4*   < > 8.5* 7.2*  HCT 26.3*   < > 25.0* 24.5*  MCV 67.6*  --   --  65.9*  PLT 280  --   --  257   < > = values in this interval not displayed.   Basic Metabolic Panel Recent Labs    10/06/21 0415  10/06/21 0432 10/06/21 0442 10/07/21 0039  NA 136   < > 139 135  K 3.4*   < > 3.3* 3.1*  CL 102  --   --  98  CO2 25  --   --  28  GLUCOSE 97  --   --  166*  BUN 10  --   --  7  CREATININE 0.42*  --   --  0.52  CALCIUM 8.8*  --   --  8.6*   < > = values in this interval not displayed.   Liver Function Tests Recent Labs    10/06/21 0415  AST 27  ALT 23  ALKPHOS 86  BILITOT 0.5  PROT 6.8  ALBUMIN 3.2*   No results for input(s): LIPASE, AMYLASE in the last 72 hours. Cardiac Enzymes No results for input(s): CKTOTAL, CKMB, CKMBINDEX, TROPONINI in the last 72 hours.  BNP: BNP (last 3 results) Recent Labs    10/06/21 1636  BNP 2,369.4*    ProBNP (last 3 results) No results for input(s): PROBNP in the last 8760 hours.   D-Dimer No results for input(s): DDIMER in the last 72 hours. Hemoglobin A1C Recent Labs    10/06/21 0415  HGBA1C 5.7*   Fasting Lipid  Panel Recent Labs    10/06/21 0415  CHOL 94  HDL 31*  LDLCALC 52  TRIG 54  CHOLHDL 3.0   Thyroid Function Tests Recent Labs    10/06/21 1739  TSH <0.010*    Other results:   Imaging    ECHOCARDIOGRAM COMPLETE  Result Date: 10/06/2021    ECHOCARDIOGRAM REPORT   Patient Name:   Judith Lowe Date of Exam: 10/06/2021 Medical Rec #:  [id number]   Height:       69.0 in Accession #:    [id number]  Weight:       131.0 lb Date of Birth:  09-04-1981   BSA:          1.726 m Patient Age:    40 years    BP:           135/87 mmHg Patient Gender: F           HR:           999 bpm. Exam Location:  Inpatient Procedure: 2D Echo, Color Doppler and Cardiac Doppler Indications:    CHF  History:        Patient has no prior history of Echocardiogram examinations.  Sonographer:    Jyl Heinz Referring Phys: [place]  1. LVEF is depressed with hypokinesis in the inferoseptal, inferior walls . Left ventricular ejection fraction, by estimation, is 40 to 45%. The left ventricle has mildly decreased  function. The left ventricular internal cavity size was mildly dilated. There is mild left ventricular hypertrophy. Indeterminate diastolic filling due to E-A fusion.  2. Right ventricular systolic function is low normal. The right ventricular size is normal.  3. Left atrial size was moderately dilated.  4. Large pleural effusion.  5. The mitral valve is normal in structure. Mild to moderate mitral valve regurgitation.  6. AV is thickened with minimally restricted motion. Mean gradient through the valve is 11 mm Hg. Marland Kitchen Aortic valve regurgitation is not visualized. Aortic valve sclerosis/calcification is present, without any evidence of aortic stenosis.  7. The inferior vena cava is dilated in size with <50% respiratory variability, suggesting right atrial pressure of 15 mmHg. FINDINGS  Left Ventricle: LVEF is depressed with hypokinesis in the inferoseptal, inferior walls. Left ventricular ejection fraction, by estimation, is 40 to 45%. The left ventricle has mildly decreased function. The left ventricular internal cavity size was mildly dilated. There is mild left ventricular hypertrophy. Indeterminate diastolic filling due to E-A fusion. Right Ventricle: The right ventricular size is normal. Right vetricular wall thickness was not assessed. Right ventricular systolic function is low normal. Left Atrium: Left atrial size was moderately dilated. Right Atrium: Right atrial size was normal in size. Pericardium: Trivial pericardial effusion is present. Mitral Valve: The mitral valve is normal in structure. Mild to moderate mitral valve regurgitation. Tricuspid Valve: The tricuspid valve is normal in structure. Tricuspid valve regurgitation is mild. Aortic Valve: AV is thickened with minimally restricted motion. Mean gradient through the valve is 11 mm Hg. Aortic valve regurgitation is not visualized. Aortic valve sclerosis/calcification is present, without any evidence of aortic stenosis. Aortic valve mean gradient  measures 11.5 mmHg. Aortic valve peak gradient measures 20.8 mmHg. Aortic valve area, by VTI measures 2.92 cm. Pulmonic Valve: The pulmonic valve was not well visualized. Pulmonic valve regurgitation is not visualized. Aorta: The aortic root and ascending aorta are structurally normal, with no evidence of dilitation. Venous: The inferior vena cava is dilated in size with less than 50%  respiratory variability, suggesting right atrial pressure of 15 mmHg. IAS/Shunts: No atrial level shunt detected by color flow Doppler. Additional Comments: There is a large pleural effusion.  LEFT VENTRICLE PLAX 2D LVIDd:         5.50 cm      Diastology LVIDs:         3.80 cm      LV e' medial:    7.83 cm/s LV PW:         1.20 cm      LV E/e' medial:  15.8 LV IVS:        1.00 cm      LV e' lateral:   9.03 cm/s LVOT diam:     2.00 cm      LV E/e' lateral: 13.7 LV SV:         106 LV SV Index:   61 LVOT Area:     3.14 cm  LV Volumes (MOD) LV vol d, MOD A2C: 179.0 ml LV vol d, MOD A4C: 150.0 ml LV vol s, MOD A2C: 105.0 ml LV vol s, MOD A4C: 86.5 ml LV SV MOD A2C:     74.0 ml LV SV MOD A4C:     150.0 ml LV SV MOD BP:      72.9 ml RIGHT VENTRICLE             IVC RV Basal diam:  4.10 cm     IVC diam: 2.70 cm RV Mid diam:    2.70 cm RV S prime:     16.40 cm/s TAPSE (M-mode): 3.0 cm LEFT ATRIUM            Index        RIGHT ATRIUM           Index LA diam:      4.20 cm  2.43 cm/m   RA Area:     17.40 cm LA Vol (A2C): 103.0 ml 59.69 ml/m  RA Volume:   44.70 ml  25.90 ml/m  AORTIC VALVE AV Area (Vmax):    2.80 cm AV Area (Vmean):   2.87 cm AV Area (VTI):     2.92 cm AV Vmax:           228.00 cm/s AV Vmean:          162.000 cm/s AV VTI:            0.362 m AV Peak Grad:      20.8 mmHg AV Mean Grad:      11.5 mmHg LVOT Vmax:         203.00 cm/s LVOT Vmean:        148.000 cm/s LVOT VTI:          0.337 m LVOT/AV VTI ratio: 0.93  AORTA Ao Root diam: 2.60 cm Ao Asc diam:  2.70 cm MITRAL VALVE                TRICUSPID VALVE MV Area (PHT): 7.59  cm     TR Peak grad:   36.7 mmHg MV Decel Time: 100 msec     TR Vmax:        303.00 cm/s MR Peak grad: 134.3 mmHg MR Mean grad: 92.5 mmHg     SHUNTS MR Vmax:      579.50 cm/s   Systemic VTI:  0.34 m MR Vmean:     467.5 cm/s    Systemic Diam: 2.00 cm MV E velocity: 124.00 cm/s MV A velocity: 140.00 cm/s  MV E/A ratio:  0.89 Dorris Carnes MD Electronically signed by Dorris Carnes MD Signature Date/Time: 10/06/2021/4:14:04 PM    Final      Medications:     Scheduled Medications:  aspirin EC  81 mg Oral Daily   docusate sodium  100 mg Oral BID   enoxaparin (LOVENOX) injection  40 mg Subcutaneous Q24H   furosemide  40 mg Intravenous BID   potassium chloride  40 mEq Oral Q4H   sacubitril-valsartan  1 tablet Oral BID   sodium chloride flush  3 mL Intravenous Q12H   sodium chloride flush  3 mL Intravenous Q12H   spironolactone  12.5 mg Oral Daily    Infusions:  nitroGLYCERIN 116.667 mcg/min (10/07/21 0715)    PRN Medications: acetaminophen **OR** acetaminophen, bisacodyl, hydrALAZINE, morphine injection, ondansetron **OR** ondansetron (ZOFRAN) IV, oxyCODONE, polyethylene glycol, traZODone    Patient Profile   40 y.o. AAF with history of polysubstance abuse and HTN. No prior cardiac history. Now admitted with new systolic HF, hypertensive urgency, iron deficiency anemia.   Assessment/Plan   Acute Systolic Heart Failure - pulmonary edema on CXR, IVC dilated on echo c/w volume overload  - Echo w/ moderately reduced LVEF ~40%, RV ok. No prior studies for comparison  - EKG shows new LBBB. HS trop 43>>44, not c/w ACS - suspect most likely NICM. Suspect Hypertensive CM + Hyperthyroidism. Cocaine and ETOH may also play a roll - Will need R/LHC this admit (12/14 after diuresis)  - cMRI if cath unrevealing (likely at later date, need to give feraheme) - Wean Nitro gtt. Titrate GDMT/afterload reducing agents - Increase entresto to 49/51 mg BID - Increase Spiro to 25 mg daily  - Continue IV lasix,  40 mg bid, for diuresis  - Add coreg 3.125 mg BID for sinus tach. Need beta blockade with hyperthyroidism. - SGLT2i prior to d/c (Hgb A1c ok at 5.7)  - Bidil eventually if BP room  - Avoidance of cocaine and reduction in ETOH imperative    2. Hypertension/ Hypertensive Urgency  - markedly HTN on admit. Long standing history but not on meds PTA - also recent cocaine use - hypokalemic on admit. ? Hyperaldosteronism  - renin/aldosterone ratio pending - renal artery dopplers to rule out fibromuscular dysplasia - initiate GDMT/Antihypertensives as outlined above. Wean nitro gtt as able - Treat hyperthyroidism  3. Hyperthyroidism - TSH < 0.010 - Check Free T3/T4 - Management per TRH   4. Iron Deficiency Anemia - Hgb 7.2 - FOBT negative - Iron panel c/w IDA. Low Ferratin, Iron and Sats - Give feraheme.  - Potentially due to heavy menses   5. Pre-Diabetes - Hgb A1c 5.7 - will need to establish care w/ PCP for further monitoring and preventative care   6. Polysubstance Use - UDS + for cocaine and THC - cessation from cocaine imperative  - avoid alcohol and tobacco use  7. Hypokalemia - K 3.1.  - Supp today - Check magnesium  Medication concerns reviewed with patient and pharmacy team. Barriers identified: Has  [employer] commercial insurance through her employer. Concern for poor health literacy level and polysubstance abuse.    Will engage HF SW and pharmacy teams to help w/ medication assistance. May need meds through HF fund. May need paramedince to help w/ compliance.    Length of Stay: 1  FINCH, LINDSAY N, PA-C  10/07/2021, 8:07 AM  Advanced Heart Failure Team Pager [phone] (M-F; 7a - 5p)  Please contact [facility] for night-coverage after hours (5p -  7a ) and weekends on [email or web address]   Patient seen with PA, agree with the above note.   She diuresed well last night, weight down.  Creatinine stable.   Hgb remains low at 7.2, Fe deficient but FOBT negative.    TSH low, hyperthyroid.   General: NAD Neck: JVP 10, +thyromegaly.  Lungs: Clear to auscultation bilaterally with normal respiratory effort. CV: Nondisplaced PMI.  Heart regular S1/S2, no S3/S4, no murmur.  No peripheral edema.   Abdomen: Soft, nontender, no hepatosplenomegaly, no distention.  Skin: Intact without lesions or rashes.  Neurologic: Alert and oriented x 3.  Psych: Normal affect. Extremities: No clubbing or cyanosis.  HEENT: Normal.   1. Acute systolic CHF: Echo this admission with EF 40-45% with inferior/inferoseptal HK, low normal RV function, mild-moderate MR, elevated AoV gradient at 11 mmHg but valve opens normally, dilated IVC, pleural effusion.  Patient abuses cocaine, also smokes and does drink some as well.  She has uncontrolled HTN.  No FH of CMP that she knows of.  Admitted with chest pressure but HS-TnI only mildly elevated with no trend, so doubt ACS.  Do not think new LBBB represents STEMI equivalent.  Cardiomyopathy could be due to longstanding HTN versus cocaine versus ischemic CMP (though probably less likely).  TSH noted to be markedly depressed, suspect hyperthyroidism may play a role in both cardiomyopathy and HTN.  She has diuresed well but is still volume overloaded.   - Continue Lasix 40 mg IV bid.  - Increase Entresto to 49/51 bid.  - Increase spironolactone to 25 mg daily.  - Add Coreg 3.125 mg bid with hyperthyroidism, she has used cocaine but the alpha antagonism should make Coreg relatively safe.  - Wean off NTG gtt.  - Needs to avoid cocaine and ETOH.  - Will arrange for LHC/RHC after diuresis (Wednesday).  2. HTN: Long-standing.  Not treated in past, elevated today. Suspect hyperthyroidism plays a role.  - Agree with checking renin/aldosterone and would also check renal artery dopplers for fibromuscular dysplasia.  - Hyperthyroidism will need treatment.  3. Fe deficiency anemia: FOBT negative.  May be due to heavy menses.  - Feraheme today (will  need to therefore hold off on cardiac MRI).  - Transfuse hgb < 7.   4. Cocaine abuse: Counseled cessation.  5. Smoking: Counseled cessation.  6. Hyperthyroidism: TSH markedly low, also with tachycardia/weight loss/cardiomyopathy.  ?Graves disease.  - Will send free T3/free T4 and order thyroid US.  - Add Coreg as above.  - Treatment of hyperthyroidism with methimazole, etc, per [facility].   Loralie Champagne 10/07/2021 8:57 AM

## 2021-10-07 NOTE — Progress Notes (Signed)
Renal artery duplex study completed.   Please see CV Proc for preliminary results.   Roseann Kees, RDMS, RVT  

## 2021-10-07 NOTE — Progress Notes (Signed)
PROGRESS NOTE    Judith Lowe  HYQ:[id number] DOB: 1981-03-20 DOA: 10/06/2021 PCP: Patient, No Pcp Per (Inactive)   Brief Narrative:  Judith Lowe is a 40 y.o. female with medical history significant of polysubstance abuse presenting with SOB, orthopnea. She was on BIPAP initially.  She reported SOB x 2-3 days with mild cough.  She denied use of drugs and expressed surprise that her UDS was + for cocaine.  She did acknowledge marijuana.  Some chest tightness last night.  Ended up being diagnosed with acute systolic congestive heart failure as well as hyperthyroidism.  CHF team following.  Plan for diuresis today and right and left heart cath tomorrow    Assessment & Plan:   Principal Problem:   Acute CHF ([place]) Active Problems:   Polysubstance abuse ([place])   Iron deficiency anemia  Acute decompensated systolic congestive heart failure: CXR consistent with mild pulmonary edema.  BNP 2369.  Etiology multifactorial, cocaine abuse and now new diagnosis of hyperthyroidism.  Also chronic alcohol abuse. Echo w/ moderately reduced LVEF ~40%, RV ok. No prior studies for comparison  - EKG shows new LBBB. HS trop 43>>44, not c/w ACS. CHF/cardiology team on board.  They have added multiple medications and plan is to diurese her today and possibly do cardiac right and left heart cath tomorrow morning.,  If nonrevealing then cardiac MRI as follow-up.  She remains on IV Lasix twice daily, Aldactone 25 mg, Entresto 49/51 mg twice daily, Coreg 3.125 mg twice daily which was added today since she needs beta-blockade with hyperthyroidism. SGLT2i prior to d/c (Hgb A1c ok at 5.7)  - Bidil eventually if BP room  - Avoidance of cocaine and reduction in ETOH imperative   Hypertensive urgency: Markedly elevated blood pressure upon arrival.  Also has hypokalemia, raising suspicion for possible hyperaldosteronism, renal and/aldosterone ratio pending.  Renal artery Doppler to rule out fibromuscular dysplasia.  She has been  initiated on nitro drip.  Blood pressure improving.  Plan to wean nitro as able to.  Now she is on multiple medications.  Hypothyroidism: TSH < 0.010 and free T4 4.0.  Per guidelines, starting on methimazole 30 mg divided into 3 doses, 10 mg 3 times daily.  Iron deficiency anemia: Iron panel consistent with iron deficiency anemia.  Feraheme ordered.  Hemoglobin 7.2.  Awaiting transfusion until it drops less than 7.  Monitor daily.  Prediabetes: Hemoglobin A1c 5.7.  Polysubstance abuse -Patient with acknowledged marijuana use, less forthright about cocaine use -Cessation encouraged; this should be encouraged on an ongoing basis   Hypokalemia: Replace.  Hypomagnesemia: Replace.  DVT prophylaxis: enoxaparin (LOVENOX) injection 40 mg Start: 10/06/21 1600   Code Status: Full Code  Family Communication:  None present at bedside.  Plan of care discussed with patient in length and he verbalized understanding and agreed with it.  Status is: Inpatient  Remains inpatient appropriate because: She is on nitro drip and needs heart cath.  Estimated body mass index is 17.88 kg/m as calculated from the following:   Height as of this encounter: 5\' 8"  (1.727 m).   Weight as of this encounter: 53.3 kg.  Nutritional Assessment: Body mass index is 17.88 kg/m.Marland Kitchen Seen by dietician.  I agree with the assessment and plan as outlined below: Nutrition Status:   Skin Assessment: I have examined the patient's skin and I agree with the wound assessment as performed by the wound care RN as outlined below:    Consultants:  Cardiology  Procedures:  None  Antimicrobials:  Anti-infectives (From admission, onward)    None          Subjective: Seen and examined.  She states that she feels better, breathing is improved.  No new complaint.  Objective: Vitals:   10/07/21 0000 10/07/21 0200 10/07/21 0400 10/07/21 0500  BP: (!) 150/94 (!) 148/93 (!) 145/93 (!) 147/97  Pulse: (!) 103  98 100  Resp:  (!) 26  (!) 24 (!) 25  Temp: 97.9 F (36.6 C)   98.1 F (36.7 C)  TempSrc: Oral   Oral  SpO2: 100%  100% 100%  Weight:    53.3 kg  Height:        Intake/Output Summary (Last 24 hours) at 10/07/2021 1244 Last data filed at 10/07/2021 0555 Gross per 24 hour  Intake 1683.33 ml  Output 5150 ml  Net -3466.67 ml   Filed Weights   10/06/21 0900 10/06/21 1737 10/07/21 0500  Weight: 59.4 kg 57.7 kg 53.3 kg    Examination:  General exam: Appears calm and comfortable  Respiratory system: Clear to auscultation. Respiratory effort normal. Cardiovascular system: S1 & S2 heard, RRR. No JVD, murmurs, rubs, gallops or clicks. No pedal edema. Gastrointestinal system: Abdomen is nondistended, soft and nontender. No organomegaly or masses felt. Normal bowel sounds heard. Central nervous system: Alert and oriented. No focal neurological deficits. Extremities: Symmetric 5 x 5 power. Skin: No rashes, lesions or ulcers Psychiatry: Judgement and insight appear normal. Mood & affect appropriate.    Data Reviewed: I have personally reviewed following labs and imaging studies  CBC: Recent Labs  Lab 10/06/21 0415 10/06/21 0432 10/06/21 0442 10/07/21 0039  WBC 4.4  --   --  5.5  NEUTROABS 1.9  --   --   --   HGB 7.4* 9.5* 8.5* 7.2*  HCT 26.3* 28.0* 25.0* 24.5*  MCV 67.6*  --   --  65.9*  PLT 280  --   --  016   Basic Metabolic Panel: Recent Labs  Lab 10/06/21 0415 10/06/21 0432 10/06/21 0442 10/07/21 0039  NA 136 140 139 135  K 3.4* 3.4* 3.3* 3.1*  CL 102  --   --  98  CO2 25  --   --  28  GLUCOSE 97  --   --  166*  BUN 10  --   --  7  CREATININE 0.42*  --   --  0.52  CALCIUM 8.8*  --   --  8.6*  MG  --   --   --  1.4*   GFR: Estimated Creatinine Clearance: 77.1 mL/min (by C-G formula based on SCr of 0.52 mg/dL). Liver Function Tests: Recent Labs  Lab 10/06/21 0415  AST 27  ALT 23  ALKPHOS 86  BILITOT 0.5  PROT 6.8  ALBUMIN 3.2*   No results for input(s): LIPASE,  AMYLASE in the last 168 hours. No results for input(s): AMMONIA in the last 168 hours. Coagulation Profile: Recent Labs  Lab 10/06/21 0415  INR 1.2   Cardiac Enzymes: No results for input(s): CKTOTAL, CKMB, CKMBINDEX, TROPONINI in the last 168 hours. BNP (last 3 results) No results for input(s): PROBNP in the last 8760 hours. HbA1C: Recent Labs    10/06/21 0415  HGBA1C 5.7*   CBG: No results for input(s): GLUCAP in the last 168 hours. Lipid Profile: Recent Labs    10/06/21 0415  CHOL 94  HDL 31*  LDLCALC 52  TRIG 54  CHOLHDL 3.0   Thyroid Function Tests: Recent Labs  10/06/21 1739 10/07/21 0838  TSH <0.010*  --   FREET4  --  4.00*   Anemia Panel: Recent Labs    10/06/21 0500 10/06/21 0545  VITAMINB12  --  827  FOLATE  --  20.0  FERRITIN  --  6*  TIBC  --  483*  IRON  --  21*  RETICCTPCT 1.8  --    Sepsis Labs: Recent Labs  Lab 10/06/21 0415  LATICACIDVEN 1.3    Recent Results (from the past 240 hour(s))  Resp Panel by RT-PCR (Flu A&B, Covid) Nasopharyngeal Swab     Status: None   Collection Time: 10/06/21  4:30 AM   Specimen: Nasopharyngeal Swab; Nasopharyngeal(NP) swabs in vial transport medium  Result Value Ref Range Status   SARS Coronavirus 2 by RT PCR NEGATIVE NEGATIVE Final    Comment: (NOTE) SARS-CoV-2 target nucleic acids are NOT DETECTED.  The SARS-CoV-2 RNA is generally detectable in upper respiratory specimens during the acute phase of infection. The lowest concentration of SARS-CoV-2 viral copies this assay can detect is 138 copies/mL. A negative result does not preclude SARS-Cov-2 infection and should not be used as the sole basis for treatment or other patient management decisions. A negative result may occur with  improper specimen collection/handling, submission of specimen other than nasopharyngeal swab, presence of viral mutation(s) within the areas targeted by this assay, and inadequate number of viral copies(<138  copies/mL). A negative result must be combined with clinical observations, patient history, and epidemiological information. The expected result is Negative.  Fact Sheet for Patients:  EntrepreneurPulse.com.au  Fact Sheet for Healthcare Providers:  IncredibleEmployment.be  This test is no t yet approved or cleared by the [place] FDA and  has been authorized for detection and/or diagnosis of SARS-CoV-2 by FDA under an Emergency Use Authorization (EUA). This EUA will remain  in effect (meaning this test can be used) for the duration of the COVID-19 declaration under Section 564(b)(1) of the Act, 21 U.S.C.section 360bbb-3(b)(1), unless the authorization is terminated  or revoked sooner.       Influenza A by PCR NEGATIVE NEGATIVE Final   Influenza B by PCR NEGATIVE NEGATIVE Final    Comment: (NOTE) The Xpert Xpress SARS-CoV-2/FLU/RSV plus assay is intended as an aid in the diagnosis of influenza from Nasopharyngeal swab specimens and should not be used as a sole basis for treatment. Nasal washings and aspirates are unacceptable for Xpert Xpress SARS-CoV-2/FLU/RSV testing.  Fact Sheet for Patients: EntrepreneurPulse.com.au  Fact Sheet for Healthcare Providers: IncredibleEmployment.be  This test is not yet approved or cleared by the [place] FDA and has been authorized for detection and/or diagnosis of SARS-CoV-2 by FDA under an Emergency Use Authorization (EUA). This EUA will remain in effect (meaning this test can be used) for the duration of the COVID-19 declaration under Section 564(b)(1) of the Act, 21 U.S.C. section 360bbb-3(b)(1), unless the authorization is terminated or revoked.  Performed at [facility], [place] [address], [place], [place] [address]       Radiology Studies: DG Chest Port 1 View  Result Date: 10/06/2021 CLINICAL DATA:  Chest pain.  Shortness of breath. EXAM:  PORTABLE CHEST 1 VIEW COMPARISON:  06/18/2013. FINDINGS: The heart is enlarged and the pulmonary vasculature is distended. Patchy airspace disease is present at the lung bases. There are small bilateral pleural effusions. No pneumothorax. No acute osseous abnormality. IMPRESSION: 1. Cardiomegaly with pulmonary vascular congestion. 2. Patchy airspace disease at the lung bases, possible edema, infiltrate, or atelectasis.  3. Small bilateral pleural effusions. Electronically Signed   By: Brett Fairy M.D.   On: 10/06/2021 04:14   ECHOCARDIOGRAM COMPLETE  Result Date: 10/06/2021    ECHOCARDIOGRAM REPORT   Patient Name:   KATHEEN ASLIN Date of Exam: 10/06/2021 Medical Rec #:  [id number]   Height:       69.0 in Accession #:    [id number]  Weight:       131.0 lb Date of Birth:  08/29/1981   BSA:          1.726 m Patient Age:    70 years    BP:           135/87 mmHg Patient Gender: F           HR:           999 bpm. Exam Location:  Inpatient Procedure: 2D Echo, Color Doppler and Cardiac Doppler Indications:    CHF  History:        Patient has no prior history of Echocardiogram examinations.  Sonographer:    Jyl Heinz Referring Phys: [place]  1. LVEF is depressed with hypokinesis in the inferoseptal, inferior walls . Left ventricular ejection fraction, by estimation, is 40 to 45%. The left ventricle has mildly decreased function. The left ventricular internal cavity size was mildly dilated. There is mild left ventricular hypertrophy. Indeterminate diastolic filling due to E-A fusion.  2. Right ventricular systolic function is low normal. The right ventricular size is normal.  3. Left atrial size was moderately dilated.  4. Large pleural effusion.  5. The mitral valve is normal in structure. Mild to moderate mitral valve regurgitation.  6. AV is thickened with minimally restricted motion. Mean gradient through the valve is 11 mm Hg. Marland Kitchen Aortic valve regurgitation is not visualized. Aortic valve  sclerosis/calcification is present, without any evidence of aortic stenosis.  7. The inferior vena cava is dilated in size with <50% respiratory variability, suggesting right atrial pressure of 15 mmHg. FINDINGS  Left Ventricle: LVEF is depressed with hypokinesis in the inferoseptal, inferior walls. Left ventricular ejection fraction, by estimation, is 40 to 45%. The left ventricle has mildly decreased function. The left ventricular internal cavity size was mildly dilated. There is mild left ventricular hypertrophy. Indeterminate diastolic filling due to E-A fusion. Right Ventricle: The right ventricular size is normal. Right vetricular wall thickness was not assessed. Right ventricular systolic function is low normal. Left Atrium: Left atrial size was moderately dilated. Right Atrium: Right atrial size was normal in size. Pericardium: Trivial pericardial effusion is present. Mitral Valve: The mitral valve is normal in structure. Mild to moderate mitral valve regurgitation. Tricuspid Valve: The tricuspid valve is normal in structure. Tricuspid valve regurgitation is mild. Aortic Valve: AV is thickened with minimally restricted motion. Mean gradient through the valve is 11 mm Hg. Aortic valve regurgitation is not visualized. Aortic valve sclerosis/calcification is present, without any evidence of aortic stenosis. Aortic valve mean gradient measures 11.5 mmHg. Aortic valve peak gradient measures 20.8 mmHg. Aortic valve area, by VTI measures 2.92 cm. Pulmonic Valve: The pulmonic valve was not well visualized. Pulmonic valve regurgitation is not visualized. Aorta: The aortic root and ascending aorta are structurally normal, with no evidence of dilitation. Venous: The inferior vena cava is dilated in size with less than 50% respiratory variability, suggesting right atrial pressure of 15 mmHg. IAS/Shunts: No atrial level shunt detected by color flow Doppler. Additional Comments: There is a large pleural effusion.  LEFT  VENTRICLE PLAX 2D LVIDd:         5.50 cm      Diastology LVIDs:         3.80 cm      LV e' medial:    7.83 cm/s LV PW:         1.20 cm      LV E/e' medial:  15.8 LV IVS:        1.00 cm      LV e' lateral:   9.03 cm/s LVOT diam:     2.00 cm      LV E/e' lateral: 13.7 LV SV:         106 LV SV Index:   61 LVOT Area:     3.14 cm  LV Volumes (MOD) LV vol d, MOD A2C: 179.0 ml LV vol d, MOD A4C: 150.0 ml LV vol s, MOD A2C: 105.0 ml LV vol s, MOD A4C: 86.5 ml LV SV MOD A2C:     74.0 ml LV SV MOD A4C:     150.0 ml LV SV MOD BP:      72.9 ml RIGHT VENTRICLE             IVC RV Basal diam:  4.10 cm     IVC diam: 2.70 cm RV Mid diam:    2.70 cm RV S prime:     16.40 cm/s TAPSE (M-mode): 3.0 cm LEFT ATRIUM            Index        RIGHT ATRIUM           Index LA diam:      4.20 cm  2.43 cm/m   RA Area:     17.40 cm LA Vol (A2C): 103.0 ml 59.69 ml/m  RA Volume:   44.70 ml  25.90 ml/m  AORTIC VALVE AV Area (Vmax):    2.80 cm AV Area (Vmean):   2.87 cm AV Area (VTI):     2.92 cm AV Vmax:           228.00 cm/s AV Vmean:          162.000 cm/s AV VTI:            0.362 m AV Peak Grad:      20.8 mmHg AV Mean Grad:      11.5 mmHg LVOT Vmax:         203.00 cm/s LVOT Vmean:        148.000 cm/s LVOT VTI:          0.337 m LVOT/AV VTI ratio: 0.93  AORTA Ao Root diam: 2.60 cm Ao Asc diam:  2.70 cm MITRAL VALVE                TRICUSPID VALVE MV Area (PHT): 7.59 cm     TR Peak grad:   36.7 mmHg MV Decel Time: 100 msec     TR Vmax:        303.00 cm/s MR Peak grad: 134.3 mmHg MR Mean grad: 92.5 mmHg     SHUNTS MR Vmax:      579.50 cm/s   Systemic VTI:  0.34 m MR Vmean:     467.5 cm/s    Systemic Diam: 2.00 cm MV E velocity: 124.00 cm/s MV A velocity: 140.00 cm/s MV E/A ratio:  0.89 Dorris Carnes MD Electronically signed by Dorris Carnes MD Signature Date/Time: 10/06/2021/4:14:04 PM    Final    US THYROID  Result Date:  10/07/2021 CLINICAL DATA:  Hyperthyroid. EXAM: THYROID ULTRASOUND TECHNIQUE: Ultrasound examination of the thyroid gland and  adjacent soft tissues was performed. COMPARISON:  None. FINDINGS: Parenchymal Echotexture: Markedly heterogenous Isthmus: 0.5 cm Right lobe: 5.8 x 2.3 x 3.1 cm Left lobe: 6.2 x 2.1 x 2.3 cm _________________________________________________________ Estimated total number of nodules >/= 1 cm: 0 Number of spongiform nodules >/=  2 cm not described below (TR1): 0 Number of mixed cystic and solid nodules >/= 1.5 cm not described below (TR2): 0 _________________________________________________________ No discrete nodules are seen within the thyroid gland. The thyroid gland is subjectively hypervascular throughout both lobes. Mildly prominent cervical lymph nodes with a left cervical lymph node measuring 1.2 cm in short axis and a right cervical lymph node measuring 0.9 cm. These are still within normal limits in size and may be reactive in nature. IMPRESSION: Mildly enlarged, very heterogeneous and hypervascular thyroid gland. 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 No discrete thyroid nodules are identified. The above is in keeping with the ACR TI-RADS recommendations - J Am Coll Radiol 2017;14:587-595. Electronically Signed   By: Aletta Edouard M.D.   On: 10/07/2021 12:28    Scheduled Meds:  aspirin EC  81 mg Oral Daily   carvedilol  3.125 mg Oral BID WC   docusate sodium  100 mg Oral BID   enoxaparin (LOVENOX) injection  40 mg Subcutaneous Q24H   furosemide  40 mg Intravenous BID   methimazole  10 mg Oral TID   potassium chloride  40 mEq Oral Q4H   potassium chloride  40 mEq Oral Once   sacubitril-valsartan  1 tablet Oral BID   sodium chloride flush  3 mL Intravenous Q12H   sodium chloride flush  3 mL Intravenous Q12H   [START ON 10/08/2021] spironolactone  25 mg Oral Daily   Continuous Infusions:  magnesium sulfate bolus IVPB     nitroGLYCERIN 35 mcg/min (10/07/21 0840)     LOS: 1 day   Time spent: 38 minutes   Darliss Cheney, MD [facility]  10/07/2021, 12:44 PM  Please page via  [place]a [place] and do not message via secure chat for anything urgent. Secure chat can be used for anything non urgent.  How to contact the [facility] Attending or Consulting provider [place] or covering provider during after hours [place], for this patient?  Check the care team in [facility] and look for a) attending/consulting [facility] provider listed and b) the [facility] team listed. Page or secure chat [phone]. Log into [email or web address] and use [place]'s universal password to access. If you do not have the password, please contact the hospital operator. Locate the [facility] provider you are looking for under [facility] and page to a number that you can be directly reached. If you still have difficulty reaching the provider, please page the [facility] (Director on Call) for the Hospitalists listed on amion for assistance.

## 2021-10-08 ENCOUNTER — Encounter (HOSPITAL_COMMUNITY)
Admission: EM | Disposition: A | Discharge status: Home / Self Care | Payer: Self-pay | Source: Home / Self Care | Attending: Internal Medicine

## 2021-10-08 ENCOUNTER — Other Ambulatory Visit (HOSPITAL_COMMUNITY): Payer: Self-pay

## 2021-10-08 ENCOUNTER — Ambulatory Visit (HOSPITAL_COMMUNITY): Admission: RE | Admit: 2021-10-08 | Payer: Self-pay | Source: Home / Self Care | Admitting: Cardiology

## 2021-10-08 DIAGNOSIS — I428 Other cardiomyopathies: Secondary | ICD-10-CM

## 2021-10-08 HISTORY — PX: RIGHT/LEFT HEART CATH AND CORONARY ANGIOGRAPHY: CATH118266

## 2021-10-08 LAB — POCT I-STAT EG7
Acid-Base Excess: 6 mmol/L — ABNORMAL HIGH (ref 0.0–2.0)
Acid-Base Excess: 6 mmol/L — ABNORMAL HIGH (ref 0.0–2.0)
Bicarbonate: 31.5 mmol/L — ABNORMAL HIGH (ref 20.0–28.0)
Bicarbonate: 31.3 mmol/L — ABNORMAL HIGH (ref 20.0–28.0)
Calcium, Ion: 1.28 mmol/L (ref 1.15–1.40)
Calcium, Ion: 1.28 mmol/L (ref 1.15–1.40)
HCT: 34 % — ABNORMAL LOW (ref 36.0–46.0)
HCT: 34 % — ABNORMAL LOW (ref 36.0–46.0)
Hemoglobin: 11.6 g/dL — ABNORMAL LOW (ref 12.0–15.0)
Hemoglobin: 11.6 g/dL — ABNORMAL LOW (ref 12.0–15.0)
O2 Saturation: 73 %
O2 Saturation: 72 %
Potassium: 3.9 mmol/L (ref 3.5–5.1)
Potassium: 3.9 mmol/L (ref 3.5–5.1)
Sodium: 141 mmol/L (ref 135–145)
Sodium: 141 mmol/L (ref 135–145)
TCO2: 33 mmol/L — ABNORMAL HIGH (ref 22–32)
TCO2: 33 mmol/L — ABNORMAL HIGH (ref 22–32)
pCO2, Ven: 50.5 mmHg (ref 44.0–60.0)
pCO2, Ven: 49.8 mmHg (ref 44.0–60.0)
pH, Ven: 7.403 (ref 7.250–7.430)
pH, Ven: 7.406 (ref 7.250–7.430)
pO2, Ven: 39 mmHg (ref 32.0–45.0)
pO2, Ven: 38 mmHg (ref 32.0–45.0)

## 2021-10-08 LAB — BASIC METABOLIC PANEL
Anion gap: 6 (ref 5–15)
BUN: 8 mg/dL (ref 6–20)
CO2: 30 mmol/L (ref 22–32)
Calcium: 9 mg/dL (ref 8.9–10.3)
Chloride: 99 mmol/L (ref 98–111)
Creatinine, Ser: 0.45 mg/dL (ref 0.44–1.00)
GFR, Estimated: >60 mL/min (ref >60)
Glucose, Bld: 117 mg/dL — ABNORMAL HIGH (ref 70–99)
Potassium: 4.2 mmol/L (ref 3.5–5.1)
Sodium: 135 mmol/L (ref 135–145)

## 2021-10-08 LAB — CBC
HCT: 32.6 % — ABNORMAL LOW (ref 36.0–46.0)
HCT: 33.2 % — ABNORMAL LOW (ref 36.0–46.0)
Hemoglobin: 9.2 g/dL — ABNORMAL LOW (ref 12.0–15.0)
Hemoglobin: 9.1 g/dL — ABNORMAL LOW (ref 12.0–15.0)
MCH: 19 pg — ABNORMAL LOW (ref 26.0–34.0)
MCH: 18.7 pg — ABNORMAL LOW (ref 26.0–34.0)
MCHC: 28.2 g/dL — ABNORMAL LOW (ref 30.0–36.0)
MCHC: 27.4 g/dL — ABNORMAL LOW (ref 30.0–36.0)
MCV: 67.4 fL — ABNORMAL LOW (ref 80.0–100.0)
MCV: 68.2 fL — ABNORMAL LOW (ref 80.0–100.0)
Platelets: 301 10*3/uL (ref 150–400)
Platelets: 312 10*3/uL (ref 150–400)
RBC: 4.84 MIL/uL (ref 3.87–5.11)
RBC: 4.87 MIL/uL (ref 3.87–5.11)
RDW: 22.3 % — ABNORMAL HIGH (ref 11.5–15.5)
RDW: 22.5 % — ABNORMAL HIGH (ref 11.5–15.5)
WBC: 5.2 10*3/uL (ref 4.0–10.5)
WBC: 6 10*3/uL (ref 4.0–10.5)
nRBC: 0.4 % — ABNORMAL HIGH (ref 0.0–0.2)
nRBC: 0.3 % — ABNORMAL HIGH (ref 0.0–0.2)

## 2021-10-08 LAB — T3, FREE: T3, Free: 15.8 pg/mL — ABNORMAL HIGH (ref 2.0–4.4)

## 2021-10-08 LAB — CREATININE, SERUM
Creatinine, Ser: 0.51 mg/dL (ref 0.44–1.00)
GFR, Estimated: >60 mL/min (ref >60)

## 2021-10-08 LAB — MAGNESIUM: Magnesium: 2.2 mg/dL (ref 1.7–2.4)

## 2021-10-08 SURGERY — RIGHT/LEFT HEART CATH AND CORONARY ANGIOGRAPHY
Anesthesia: LOCAL

## 2021-10-08 MED ORDER — SODIUM CHLORIDE 0.9 % IV SOLN: 250.0000 mL | INTRAVENOUS | Status: DC | PRN

## 2021-10-08 MED ORDER — FENTANYL CITRATE (PF) 100 MCG/2ML IJ SOLN
INTRAMUSCULAR | Status: DC | PRN
  Administered 2021-10-08 (×2): 25 ug via INTRAVENOUS

## 2021-10-08 MED ORDER — ASPIRIN 81 MG PO CHEW
81.0000 mg | CHEWABLE_TABLET | ORAL | Status: AC
  Administered 2021-10-08: 06:00:00 81 mg via ORAL
  Filled 2021-10-08: qty 1

## 2021-10-08 MED ORDER — SODIUM CHLORIDE 0.9% FLUSH: 3.0000 mL | INTRAVENOUS | Status: DC | PRN

## 2021-10-08 MED ORDER — VERAPAMIL HCL 2.5 MG/ML IV SOLN
INTRAVENOUS | Status: AC
  Filled 2021-10-08: qty 2

## 2021-10-08 MED ORDER — SODIUM CHLORIDE 0.9 % IV SOLN: INTRAVENOUS | Status: DC

## 2021-10-08 MED ORDER — MIDAZOLAM HCL 2 MG/2ML IJ SOLN
INTRAMUSCULAR | Status: DC | PRN
  Administered 2021-10-08 (×2): 1 mg via INTRAVENOUS

## 2021-10-08 MED ORDER — ONDANSETRON HCL 4 MG/2ML IJ SOLN: 4.0000 mg | Freq: Four times a day (QID) | INTRAMUSCULAR | Status: DC | PRN

## 2021-10-08 MED ORDER — LIDOCAINE HCL (PF) 1 % IJ SOLN
INTRAMUSCULAR | Status: AC
  Filled 2021-10-08: qty 30

## 2021-10-08 MED ORDER — SODIUM CHLORIDE 0.9% FLUSH: 3.0000 mL | Freq: Two times a day (BID) | INTRAVENOUS | Status: DC

## 2021-10-08 MED ORDER — LABETALOL HCL 5 MG/ML IV SOLN: 10.0000 mg | INTRAVENOUS | Status: AC | PRN

## 2021-10-08 MED ORDER — FENTANYL CITRATE (PF) 100 MCG/2ML IJ SOLN
INTRAMUSCULAR | Status: AC
  Filled 2021-10-08: qty 2

## 2021-10-08 MED ORDER — LIDOCAINE HCL (PF) 1 % IJ SOLN
INTRAMUSCULAR | Status: DC | PRN
  Administered 2021-10-08: 4 mL
  Administered 2021-10-08: 2 mL

## 2021-10-08 MED ORDER — HEPARIN SODIUM (PORCINE) 1000 UNIT/ML IJ SOLN
INTRAMUSCULAR | Status: DC | PRN
  Administered 2021-10-08: 3000 [IU] via INTRAVENOUS

## 2021-10-08 MED ORDER — MIDAZOLAM HCL 2 MG/2ML IJ SOLN
INTRAMUSCULAR | Status: AC
  Filled 2021-10-08: qty 2

## 2021-10-08 MED ORDER — HEPARIN SODIUM (PORCINE) 1000 UNIT/ML IJ SOLN
INTRAMUSCULAR | Status: AC
  Filled 2021-10-08: qty 10

## 2021-10-08 MED ORDER — HEPARIN (PORCINE) IN NACL 1000-0.9 UT/500ML-% IV SOLN
INTRAVENOUS | Status: AC
  Filled 2021-10-08: qty 1000

## 2021-10-08 MED ORDER — VERAPAMIL HCL 2.5 MG/ML IV SOLN
INTRAVENOUS | Status: DC | PRN
  Administered 2021-10-08: 16:00:00 10 mL via INTRA_ARTERIAL

## 2021-10-08 MED ORDER — ACETAMINOPHEN 325 MG PO TABS
650.0000 mg | ORAL_TABLET | ORAL | Status: DC | PRN
  Administered 2021-10-08: 20:00:00 650 mg via ORAL
  Filled 2021-10-08: qty 2

## 2021-10-08 MED ORDER — SODIUM CHLORIDE 0.9 % IV SOLN: INTRAVENOUS | Status: AC

## 2021-10-08 MED ORDER — IOHEXOL 350 MG/ML SOLN
INTRAVENOUS | Status: DC | PRN
  Administered 2021-10-08: 16:00:00 45 mL

## 2021-10-08 MED ORDER — ISOSORB DINITRATE-HYDRALAZINE 20-37.5 MG PO TABS
1.0000 | ORAL_TABLET | Freq: Three times a day (TID) | ORAL | Status: DC
  Administered 2021-10-08 – 2021-10-09 (×4): 1 via ORAL
  Filled 2021-10-08 (×4): qty 1

## 2021-10-08 MED ORDER — HYDRALAZINE HCL 20 MG/ML IJ SOLN: 10.0000 mg | INTRAMUSCULAR | Status: AC | PRN

## 2021-10-08 MED ORDER — HEPARIN (PORCINE) IN NACL 2000-0.9 UNIT/L-% IV SOLN
INTRAVENOUS | Status: DC | PRN
  Administered 2021-10-08: 1000 mL

## 2021-10-08 MED ORDER — ENOXAPARIN SODIUM 40 MG/0.4ML IJ SOSY
40.0000 mg | PREFILLED_SYRINGE | INTRAMUSCULAR | Status: DC
  Filled 2021-10-08: qty 0.4

## 2021-10-08 SURGICAL SUPPLY — 12 items
CATH 5FR JL3.5 JR4 ANG PIG MP (CATHETERS) ×1 IMPLANT
CATH BALLN WEDGE 5F 110CM (CATHETERS) ×1 IMPLANT
DEVICE RAD TR BAND REGULAR (VASCULAR PRODUCTS) ×1 IMPLANT
GLIDESHEATH SLEND SS 6F .021 (SHEATH) ×1 IMPLANT
GUIDEWIRE INQWIRE 1.5J.035X260 (WIRE) IMPLANT
INQWIRE 1.5J .035X260CM (WIRE) ×2
KIT HEART LEFT (KITS) ×2 IMPLANT
PACK CARDIAC CATHETERIZATION (CUSTOM PROCEDURE TRAY) ×2 IMPLANT
SHEATH GLIDE SLENDER 4/5FR (SHEATH) ×1 IMPLANT
SHEATH PROBE COVER 6X72 (BAG) ×1 IMPLANT
TRANSDUCER W/STOPCOCK (MISCELLANEOUS) ×2 IMPLANT
WIRE HI TORQ VERSACORE-J 145CM (WIRE) ×1 IMPLANT

## 2021-10-08 NOTE — Interval H&P Note (Signed)
History and Physical Interval Note:  10/08/2021 3:05 PM  Judith Lowe  has presented today for surgery, with the diagnosis of heart failure.  The various methods of treatment have been discussed with the patient and family. After consideration of risks, benefits and other options for treatment, the patient has consented to  Procedure(s): RIGHT/LEFT HEART CATH AND CORONARY ANGIOGRAPHY (N/A) as a surgical intervention.  The patient's history has been reviewed, patient examined, no change in status, stable for surgery.  I have reviewed the patient's chart and labs.  Questions were answered to the patient's satisfaction.     [name]

## 2021-10-08 NOTE — Progress Notes (Signed)
Pt resting with stable VS. NO bipap needed at this time.

## 2021-10-08 NOTE — TOC Benefit Eligibility Note (Signed)
Patient Teacher, English as a foreign language completed.    The patient is currently admitted and upon discharge could be taking isosorbide-hydralazine 20-37.5 mg (Bidil).  The current 30 day co-pay is, $0.00.   The patient is insured through [employer], [place] Patient Advocate Specialist [place] Patient Advocate Team Direct Number: [phone]  Fax: [phone]

## 2021-10-08 NOTE — Progress Notes (Signed)
Patient with sudden onset of chest pain 7/10. The pain is described as sharp on the mid left of the chest and shooting to her back. EKG completed, nitro drip increased. Patient pain subsided completely a few minutes after nitro drip increase. MD notified.

## 2021-10-08 NOTE — Progress Notes (Signed)
PROGRESS NOTE    Judith Lowe  QPY:[id number] DOB: 1981/08/24 DOA: 10/06/2021 PCP: Patient, No Pcp Per (Inactive)   Chief Complaint  Patient presents with   SOB / Chest Tightness   Brief Narrative/Hospital Course:  Judith Lowe, 40 y.o. female with PMH of polysubstance abuse presented with shortness of breath and orthopnea for 2 to 3 days along with mild cough.  She denied use of drug use and expressed surprise that cardiologist came back positive for cocaine, acknowledges marijuana use.  Initially needing BiPAP.  She had some chest tightness, work-up subsequently found out that she had systolic congestive heart failure along with hyperthyroidism seen by advanced heart failure team placed on IV diuretics, also started on methimazole.  Subjective: Episode of chest tightness last night that improved with increasing nitroglycerin This a.m. she is alert awake oriented.  Appears ill looking frail and older than her age On 2 L nasal cannula  Assessment & Plan:  Acute systolic heart failure Nonischemic cardiomyopathy-hypertensive/cocaine EtOH induced Demand ischemia New LBBB: Patient with new onset, EF~40%, RV okay, no previous echo.  EKG with new LBBB with troponin leak 43>> 44 flat from demand ischemia due to CHF.  Suspecting hypertensive cardiomyopathy and possible cocaine and alcohol contribution.  Being managed with diuresis, BiDil, Entresto, Aldactone, Coreg.  On nitro drip wean off as tolerated.  Continue plan as per heart failure team, for RHC today.  Continue intake output monitoring, fluid restriction/low-salt diet Net IO Since Admission: -5,833.68 mL [10/08/21 1008]  Filed Weights   10/06/21 1737 10/07/21 0500 10/08/21 0311  Weight: 57.7 kg 53.3 kg 50.5 kg   Hypertension urgency: Uncontrolled hypertension on admission.  Also with recent cocaine use.  Follow-up /[place] rule out hyperaldosteronism, renal artery Doppler no RAS, continue heart failure meds to help with blood pressure,  wean off nitro drip.  Continue hyperthyroidism treatment  Hyperthyroidism: TSH less than 0.01 Free T4 4, thyroid ultrasound showing thyroiditis, per guideline in the setting of decompensated CHF on: Hypertension placed on methimazole will need outpatient close follow-up with endocrinology PCP to monitor and adjust dose  IDA: FOBT negative low ferritin iron NSAIDs, given Feraheme likely from her heavy menstruation, monitor and continue iron supplementation as outpatient Recent Labs  Lab 10/06/21 0415 10/06/21 0432 10/06/21 0442 10/07/21 0039 10/08/21 0449  HGB 7.4* 9.5* 8.5* 7.2* 9.2*  HCT 26.3* 28.0* 25.0* 24.5* 32.6*    Hypokalemia Hypomagnesemia: Repleted and improved  Prediabetes hemoglobin A1c 5.7 dietary restriction outpatient follow-up  Polysubstance abuse with cocaine and marijuana, alcohol: Cessation has been discussed extensively in the light of her decompensated CHF  Low BMI 16.9 RD eval and consult  DVT prophylaxis: enoxaparin (LOVENOX) injection 40 mg Start: 10/06/21 1600 Code Status:   Code Status: Full Code Family Communication: plan of care discussed with patient at bedside. Status is: Inpatient Remains inpatient appropriate because: For ongoing management of acute heart failur Disposition: Currently not medically stable for discharge. Anticipated Disposition: TBD   Objective: Vitals last 24 hrs: Vitals:   10/08/21 0018 10/08/21 0219 10/08/21 0311 10/08/21 0847  BP: (!) 148/99 138/90 (!) 148/115 (!) 141/89  Pulse: 95  (!) 104 100  Resp: 18 (!) 29 (!) 23 (!) 24  Temp: 97.8 F (36.6 C)  97.7 F (36.5 C) 98.7 F (37.1 C)  TempSrc: Oral  Oral Oral  SpO2: 100% 100% 100% 100%  Weight:   50.5 kg   Height:       Weight change: -8.869 kg  Intake/Output Summary (Last 24 hours)  at 10/08/2021 1004 Last data filed at 10/08/2021 0544 Gross per 24 hour  Intake 877.41 ml  Output 2000 ml  Net -1122.59 ml   Net IO Since Admission: -5,833.68 mL [10/08/21 1004]    Physical Examination: General exam: AA0X3, thin looking frail, weak,older than stated age. HEENT:Oral mucosa moist, Ear/Nose WNL grossly,dentition normal. Respiratory system: B/l CLEAR BS, no use of accessory muscle, non tender. Cardiovascular system: S1 & S2 +,No JVD. Gastrointestinal system: Abdomen soft, NT,ND, BS+. Nervous System:Alert, awake, moving extremities. Extremities: edema none, distal peripheral pulses palpable.  Skin: No rashes, no icterus. MSK: Normal muscle bulk, tone, power.  Medications reviewed:  Scheduled Meds:  aspirin EC  81 mg Oral Daily   carvedilol  3.125 mg Oral BID WC   docusate sodium  100 mg Oral BID   enoxaparin (LOVENOX) injection  40 mg Subcutaneous Q24H   isosorbide-hydrALAZINE  1 tablet Oral TID   methimazole  10 mg Oral TID   sacubitril-valsartan  1 tablet Oral BID   sodium chloride flush  3 mL Intravenous Q12H   sodium chloride flush  3 mL Intravenous Q12H   spironolactone  25 mg Oral Daily   Continuous Infusions:  sodium chloride     sodium chloride 10 mL/hr at 10/08/21 2202    Diet Order             Diet NPO time specified  Diet effective 1000                   Weight change: -8.869 kg  Wt Readings from Last 3 Encounters:  10/08/21 50.5 kg  05/12/21 59.4 kg  08/16/20 56.7 kg     Consultants:see note  Procedures:see note Antimicrobials: Anti-infectives (From admission, onward)    None      Culture/Microbiology No results found for: SDES, SPECREQUEST, CULT, REPTSTATUS  Other culture-see note  Unresulted Labs (From admission, onward)     Start     Ordered   10/09/21 0500  Hepatic function panel  Tomorrow morning,   R        10/08/21 0902   10/08/21 5427  Basic metabolic panel  Daily,   R      10/07/21 0853   10/08/21 0500  CBC  Daily,   R      10/07/21 0853   10/06/21 1644  Aldosterone + renin activity w/ ratio  Once,   AD        10/06/21 1645          Data Reviewed: I have personally reviewed  following labs and imaging studies CBC: Recent Labs  Lab 10/06/21 0415 10/06/21 0432 10/06/21 0442 10/07/21 0039 10/08/21 0449  WBC 4.4  --   --  5.5 5.2  NEUTROABS 1.9  --   --   --   --   HGB 7.4* 9.5* 8.5* 7.2* 9.2*  HCT 26.3* 28.0* 25.0* 24.5* 32.6*  MCV 67.6*  --   --  65.9* 67.4*  PLT 280  --   --  257 062   Basic Metabolic Panel: Recent Labs  Lab 10/06/21 0415 10/06/21 0432 10/06/21 0442 10/07/21 0039 10/08/21 0449  NA 136 140 139 135 135  K 3.4* 3.4* 3.3* 3.1* 4.2  CL 102  --   --  98 99  CO2 25  --   --  28 30  GLUCOSE 97  --   --  166* 117*  BUN 10  --   --  7 8  CREATININE 0.42*  --   --  0.52 0.45  CALCIUM 8.8*  --   --  8.6* 9.0  MG  --   --   --  1.4* 2.2   GFR: Estimated Creatinine Clearance: 73 mL/min (by C-G formula based on SCr of 0.45 mg/dL). Liver Function Tests: Recent Labs  Lab 10/06/21 0415  AST 27  ALT 23  ALKPHOS 86  BILITOT 0.5  PROT 6.8  ALBUMIN 3.2*   No results for input(s): LIPASE, AMYLASE in the last 168 hours. No results for input(s): AMMONIA in the last 168 hours. Coagulation Profile: Recent Labs  Lab 10/06/21 0415  INR 1.2   Cardiac Enzymes: No results for input(s): CKTOTAL, CKMB, CKMBINDEX, TROPONINI in the last 168 hours. BNP (last 3 results) No results for input(s): PROBNP in the last 8760 hours. HbA1C: Recent Labs    10/06/21 0415  HGBA1C 5.7*   CBG: No results for input(s): GLUCAP in the last 168 hours. Lipid Profile: Recent Labs    10/06/21 0415  CHOL 94  HDL 31*  LDLCALC 52  TRIG 54  CHOLHDL 3.0   Thyroid Function Tests: Recent Labs    10/06/21 1739 10/07/21 0838  TSH <0.010*  --   FREET4  --  4.00*  T3FREE  --  15.8*   Anemia Panel: Recent Labs    10/06/21 0500 10/06/21 0545  VITAMINB12  --  827  FOLATE  --  20.0  FERRITIN  --  6*  TIBC  --  483*  IRON  --  21*  RETICCTPCT 1.8  --    Sepsis Labs: Recent Labs  Lab 10/06/21 0415  LATICACIDVEN 1.3    Recent Results (from the  past 240 hour(s))  Resp Panel by RT-PCR (Flu A&B, Covid) Nasopharyngeal Swab     Status: None   Collection Time: 10/06/21  4:30 AM   Specimen: Nasopharyngeal Swab; Nasopharyngeal(NP) swabs in vial transport medium  Result Value Ref Range Status   SARS Coronavirus 2 by RT PCR NEGATIVE NEGATIVE Final    Comment: (NOTE) SARS-CoV-2 target nucleic acids are NOT DETECTED.  The SARS-CoV-2 RNA is generally detectable in upper respiratory specimens during the acute phase of infection. The lowest concentration of SARS-CoV-2 viral copies this assay can detect is 138 copies/mL. A negative result does not preclude SARS-Cov-2 infection and should not be used as the sole basis for treatment or other patient management decisions. A negative result may occur with  improper specimen collection/handling, submission of specimen other than nasopharyngeal swab, presence of viral mutation(s) within the areas targeted by this assay, and inadequate number of viral copies(<138 copies/mL). A negative result must be combined with clinical observations, patient history, and epidemiological information. The expected result is Negative.  Fact Sheet for Patients:  EntrepreneurPulse.com.au  Fact Sheet for Healthcare Providers:  IncredibleEmployment.be  This test is no t yet approved or cleared by the [place] FDA and  has been authorized for detection and/or diagnosis of SARS-CoV-2 by FDA under an Emergency Use Authorization (EUA). This EUA will remain  in effect (meaning this test can be used) for the duration of the COVID-19 declaration under Section 564(b)(1) of the Act, 21 U.S.C.section 360bbb-3(b)(1), unless the authorization is terminated  or revoked sooner.       Influenza A by PCR NEGATIVE NEGATIVE Final   Influenza B by PCR NEGATIVE NEGATIVE Final    Comment: (NOTE) The Xpert Xpress SARS-CoV-2/FLU/RSV plus assay is intended as an aid in the diagnosis of  influenza from Nasopharyngeal swab specimens and should not be  used as a sole basis for treatment. Nasal washings and aspirates are unacceptable for Xpert Xpress SARS-CoV-2/FLU/RSV testing.  Fact Sheet for Patients: EntrepreneurPulse.com.au  Fact Sheet for Healthcare Providers: IncredibleEmployment.be  This test is not yet approved or cleared by the [place] FDA and has been authorized for detection and/or diagnosis of SARS-CoV-2 by FDA under an Emergency Use Authorization (EUA). This EUA will remain in effect (meaning this test can be used) for the duration of the COVID-19 declaration under Section 564(b)(1) of the Act, 21 U.S.C. section 360bbb-3(b)(1), unless the authorization is terminated or revoked.  Performed at [facility], [address], [place], [place] [address]      Radiology Studies: ECHOCARDIOGRAM COMPLETE  Result Date: 10/06/2021    ECHOCARDIOGRAM REPORT   Patient Name:   Judith Lowe Date of Exam: 10/06/2021 Medical Rec #:  [id number]   Height:       69.0 in Accession #:    [id number]  Weight:       131.0 lb Date of Birth:  January 30, 1981   BSA:          1.726 m Patient Age:    56 years    BP:           135/87 mmHg Patient Gender: F           HR:           999 bpm. Exam Location:  Inpatient Procedure: 2D Echo, Color Doppler and Cardiac Doppler Indications:    CHF  History:        Patient has no prior history of Echocardiogram examinations.  Sonographer:    Jyl Heinz Referring Phys: Emmett  1. LVEF is depressed with hypokinesis in the inferoseptal, inferior walls . Left ventricular ejection fraction, by estimation, is 40 to 45%. The left ventricle has mildly decreased function. The left ventricular internal cavity size was mildly dilated. There is mild left ventricular hypertrophy. Indeterminate diastolic filling due to E-A fusion.  2. Right ventricular systolic function is low normal. The right  ventricular size is normal.  3. Left atrial size was moderately dilated.  4. Large pleural effusion.  5. The mitral valve is normal in structure. Mild to moderate mitral valve regurgitation.  6. AV is thickened with minimally restricted motion. Mean gradient through the valve is 11 mm Hg. Marland Kitchen Aortic valve regurgitation is not visualized. Aortic valve sclerosis/calcification is present, without any evidence of aortic stenosis.  7. The inferior vena cava is dilated in size with <50% respiratory variability, suggesting right atrial pressure of 15 mmHg. FINDINGS  Left Ventricle: LVEF is depressed with hypokinesis in the inferoseptal, inferior walls. Left ventricular ejection fraction, by estimation, is 40 to 45%. The left ventricle has mildly decreased function. The left ventricular internal cavity size was mildly dilated. There is mild left ventricular hypertrophy. Indeterminate diastolic filling due to E-A fusion. Right Ventricle: The right ventricular size is normal. Right vetricular wall thickness was not assessed. Right ventricular systolic function is low normal. Left Atrium: Left atrial size was moderately dilated. Right Atrium: Right atrial size was normal in size. Pericardium: Trivial pericardial effusion is present. Mitral Valve: The mitral valve is normal in structure. Mild to moderate mitral valve regurgitation. Tricuspid Valve: The tricuspid valve is normal in structure. Tricuspid valve regurgitation is mild. Aortic Valve: AV is thickened with minimally restricted motion. Mean gradient through the valve is 11 mm Hg. Aortic valve regurgitation is not visualized. Aortic valve sclerosis/calcification is present, without  any evidence of aortic stenosis. Aortic valve mean gradient measures 11.5 mmHg. Aortic valve peak gradient measures 20.8 mmHg. Aortic valve area, by VTI measures 2.92 cm. Pulmonic Valve: The pulmonic valve was not well visualized. Pulmonic valve regurgitation is not visualized. Aorta: The aortic  root and ascending aorta are structurally normal, with no evidence of dilitation. Venous: The inferior vena cava is dilated in size with less than 50% respiratory variability, suggesting right atrial pressure of 15 mmHg. IAS/Shunts: No atrial level shunt detected by color flow Doppler. Additional Comments: There is a large pleural effusion.  LEFT VENTRICLE PLAX 2D LVIDd:         5.50 cm      Diastology LVIDs:         3.80 cm      LV e' medial:    7.83 cm/s LV PW:         1.20 cm      LV E/e' medial:  15.8 LV IVS:        1.00 cm      LV e' lateral:   9.03 cm/s LVOT diam:     2.00 cm      LV E/e' lateral: 13.7 LV SV:         106 LV SV Index:   61 LVOT Area:     3.14 cm  LV Volumes (MOD) LV vol d, MOD A2C: 179.0 ml LV vol d, MOD A4C: 150.0 ml LV vol s, MOD A2C: 105.0 ml LV vol s, MOD A4C: 86.5 ml LV SV MOD A2C:     74.0 ml LV SV MOD A4C:     150.0 ml LV SV MOD BP:      72.9 ml RIGHT VENTRICLE             IVC RV Basal diam:  4.10 cm     IVC diam: 2.70 cm RV Mid diam:    2.70 cm RV S prime:     16.40 cm/s TAPSE (M-mode): 3.0 cm LEFT ATRIUM            Index        RIGHT ATRIUM           Index LA diam:      4.20 cm  2.43 cm/m   RA Area:     17.40 cm LA Vol (A2C): 103.0 ml 59.69 ml/m  RA Volume:   44.70 ml  25.90 ml/m  AORTIC VALVE AV Area (Vmax):    2.80 cm AV Area (Vmean):   2.87 cm AV Area (VTI):     2.92 cm AV Vmax:           228.00 cm/s AV Vmean:          162.000 cm/s AV VTI:            0.362 m AV Peak Grad:      20.8 mmHg AV Mean Grad:      11.5 mmHg LVOT Vmax:         203.00 cm/s LVOT Vmean:        148.000 cm/s LVOT VTI:          0.337 m LVOT/AV VTI ratio: 0.93  AORTA Ao Root diam: 2.60 cm Ao Asc diam:  2.70 cm MITRAL VALVE                TRICUSPID VALVE MV Area (PHT): 7.59 cm     TR Peak grad:   36.7 mmHg MV Decel Time: 100 msec  TR Vmax:        303.00 cm/s MR Peak grad: 134.3 mmHg MR Mean grad: 92.5 mmHg     SHUNTS MR Vmax:      579.50 cm/s   Systemic VTI:  0.34 m MR Vmean:     467.5 cm/s    Systemic  Diam: 2.00 cm MV E velocity: 124.00 cm/s MV A velocity: 140.00 cm/s MV E/A ratio:  0.89 Dorris Carnes MD Electronically signed by Dorris Carnes MD Signature Date/Time: 10/06/2021/4:14:04 PM    Final    US THYROID  Result Date: 10/07/2021 CLINICAL DATA:  Hyperthyroid. EXAM: THYROID ULTRASOUND TECHNIQUE: Ultrasound examination of the thyroid gland and adjacent soft tissues was performed. COMPARISON:  None. FINDINGS: Parenchymal Echotexture: Markedly heterogenous Isthmus: 0.5 cm Right lobe: 5.8 x 2.3 x 3.1 cm Left lobe: 6.2 x 2.1 x 2.3 cm _________________________________________________________ Estimated total number of nodules >/= 1 cm: 0 Number of spongiform nodules >/=  2 cm not described below (TR1): 0 Number of mixed cystic and solid nodules >/= 1.5 cm not described below (TR2): 0 _________________________________________________________ No discrete nodules are seen within the thyroid gland. The thyroid gland is subjectively hypervascular throughout both lobes. Mildly prominent cervical lymph nodes with a left cervical lymph node measuring 1.2 cm in short axis and a right cervical lymph node measuring 0.9 cm. These are still within normal limits in size and may be reactive in nature. IMPRESSION: Mildly enlarged, very heterogeneous and hypervascular thyroid gland. 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 No discrete thyroid nodules are identified. The above is in keeping with the ACR TI-RADS recommendations - J Am Coll Radiol 2017;14:587-595. Electronically Signed   By: Aletta Edouard M.D.   On: 10/07/2021 12:28   VAS US RENAL ARTERY DUPLEX  Result Date: 10/07/2021 ABDOMINAL VISCERAL Patient Name:  Judith Lowe  Date of Exam:   10/07/2021 Medical Rec #: [id number]    Accession #:    [id number] Date of Birth: 01-Apr-1981    Patient Gender: F Patient Age:   23 years Exam Location:  [facility] Procedure:      VAS US RENAL ARTERY DUPLEX Referring Phys: Arco  -------------------------------------------------------------------------------- Indications: Hypertension Other Factors: Concern for FMD. Comparison Study: 08-19-2019 CT abdomen/pelvis with contrast showed kidney and                   collecting system within normal limits. Performing Technologist: Darlin Coco RDMS, RVT  Examination Guidelines: A complete evaluation includes B-mode imaging, spectral Doppler, color Doppler, and power Doppler as needed of all accessible portions of each vessel. Bilateral testing is considered an integral part of a complete examination. Limited examinations for reoccurring indications may be performed as noted.  Duplex Findings: +----------------------+--------+--------+------+--------+  Mesenteric             PSV cm/s EDV cm/s Plaque Comments  +----------------------+--------+--------+------+--------+  Aorta Mid                 89                              +----------------------+--------+--------+------+--------+  Celiac Artery Proximal   118                              +----------------------+--------+--------+------+--------+  SMA Origin               115                              +----------------------+--------+--------+------+--------+    +------------------+--------+--------+-------+  Right Renal Artery PSV cm/s EDV cm/s Comment  +------------------+--------+--------+-------+  Origin                88       28             +------------------+--------+--------+-------+  Proximal              66       16             +------------------+--------+--------+-------+  Mid                   76       24             +------------------+--------+--------+-------+  Distal                74       28             +------------------+--------+--------+-------+ +-----------------+--------+--------+-------+  Left Renal Artery PSV cm/s EDV cm/s Comment  +-----------------+--------+--------+-------+  Origin               56       21              +-----------------+--------+--------+-------+  Proximal             64       12             +-----------------+--------+--------+-------+  Mid                  62       23             +-----------------+--------+--------+-------+  Distal               67       23             +-----------------+--------+--------+-------+ +------------+--------+--------+----+-----------+--------+--------+----+  Right Kidney PSV cm/s EDV cm/s RI   Left Kidney PSV cm/s EDV cm/s RI    +------------+--------+--------+----+-----------+--------+--------+----+  Upper Pole   31       10       0.69 Upper Pole  18       7        0.62  +------------+--------+--------+----+-----------+--------+--------+----+  Mid          44       14       0.69 Mid         45       14       0.68  +------------+--------+--------+----+-----------+--------+--------+----+  Lower Pole   27       10       0.64 Lower Pole  34       12       0.63  +------------+--------+--------+----+-----------+--------+--------+----+  Hilar        52       11       0.78 Hilar       48       12       0.75  +------------+--------+--------+----+-----------+--------+--------+----+ +------------------+-----+------------------+-----+  Right Kidney             Left Kidney               +------------------+-----+------------------+-----+  RAR                      RAR                       +------------------+-----+------------------+-----+  RAR (manual)       0.99  RAR (manual)       0.75   +------------------+-----+------------------+-----+  Cortex                   Cortex                    +------------------+-----+------------------+-----+  Cortex thickness         Corex thickness           +------------------+-----+------------------+-----+  Kidney length (cm) 11.72 Kidney length (cm) 11.24  +------------------+-----+------------------+-----+  Summary: Renal:  Right: Normal right Resisitive Index. No evidence of right renal        artery stenosis. RRV flow present. Normal size right  kidney. Left:  Normal left Resistive Index. No evidence of left renal artery        stenosis. LRV flow present. Normal size of left kidney.  *See table(s) above for measurements and observations.  Diagnosing physician: Deitra Mayo MD  Electronically signed by Deitra Mayo MD on 10/07/2021 at 4:00:52 PM.    Final      LOS: 2 days   Antonieta Pert, MD [facility]  10/08/2021, 10:04 AM

## 2021-10-08 NOTE — H&P (View-Only) (Signed)
Advanced Heart Failure Rounding Note  PCP-Cardiologist: None   Subjective:    Had episode sharp CP around 2 pm. Pain improved with increasing nitro gtt. No recurrence.   Dyspnea improved. Very hungry.  BP remains elevated.   Continues to diurese. Is/Os not accurate d/t unmeasured voids. Weight down another 6 lb.   Objective:   Weight Range: 50.5 kg Body mass index is 16.94 kg/m.   Vital Signs:   Temp:  [97.7 F (36.5 C)-98.5 F (36.9 C)] 97.7 F (36.5 C) (12/14 0311) Pulse Rate:  [95-137] 104 (12/14 0311) Resp:  [18-29] 23 (12/14 0311) BP: (138-153)/(90-115) 148/115 (12/14 0311) SpO2:  [97 %-100 %] 100 % (12/14 0311) Weight:  [50.5 kg] 50.5 kg (12/14 0311) Last BM Date: 10/05/21  Weight change: Filed Weights   10/06/21 1737 10/07/21 0500 10/08/21 0311  Weight: 57.7 kg 53.3 kg 50.5 kg    Intake/Output:   Intake/Output Summary (Last 24 hours) at 10/08/2021 0754 Last data filed at 10/08/2021 0544 Gross per 24 hour  Intake 877.41 ml  Output 2000 ml  Net -1122.59 ml      Physical Exam    General:  AAF appears much older than stated age. Thin. HEENT: normal Neck: supple. no JVD. Carotids 2+ bilat; no bruits. + thyromegaly Cor: PMI nondisplaced. Regular rhythm, tachy.. No rubs, gallops or murmurs. Lungs: clear Abdomen: soft, nontender, nondistended.  Extremities: no cyanosis, clubbing, rash, edema Neuro: alert & orientedx3, cranial nerves grossly intact. moves all 4 extremities w/o difficulty. Affect pleasant   Telemetry   SR/sinus tach, 90s-low 100s  Labs    CBC Recent Labs    10/06/21 0415 10/06/21 0432 10/07/21 0039 10/08/21 0449  WBC 4.4  --  5.5 5.2  NEUTROABS 1.9  --   --   --   HGB 7.4*   < > 7.2* 9.2*  HCT 26.3*   < > 24.5* 32.6*  MCV 67.6*  --  65.9* 67.4*  PLT 280  --  257 301   < > = values in this interval not displayed.   Basic Metabolic Panel Recent Labs    10/07/21 0039 10/08/21 0449  NA 135 135  K 3.1* 4.2  CL 98  99  CO2 28 30  GLUCOSE 166* 117*  BUN 7 8  CREATININE 0.52 0.45  CALCIUM 8.6* 9.0  MG 1.4* 2.2   Liver Function Tests Recent Labs    10/06/21 0415  AST 27  ALT 23  ALKPHOS 86  BILITOT 0.5  PROT 6.8  ALBUMIN 3.2*   No results for input(s): LIPASE, AMYLASE in the last 72 hours. Cardiac Enzymes No results for input(s): CKTOTAL, CKMB, CKMBINDEX, TROPONINI in the last 72 hours.  BNP: BNP (last 3 results) Recent Labs    10/06/21 1636  BNP 2,369.4*    ProBNP (last 3 results) No results for input(s): PROBNP in the last 8760 hours.   D-Dimer No results for input(s): DDIMER in the last 72 hours. Hemoglobin A1C Recent Labs    10/06/21 0415  HGBA1C 5.7*   Fasting Lipid Panel Recent Labs    10/06/21 0415  CHOL 94  HDL 31*  LDLCALC 52  TRIG 54  CHOLHDL 3.0   Thyroid Function Tests Recent Labs    10/06/21 1739 10/07/21 0838  TSH <0.010*  --   T3FREE  --  15.8*    Other results:   Imaging    US THYROID  Result Date: 10/07/2021 CLINICAL DATA:  Hyperthyroid. EXAM: THYROID ULTRASOUND TECHNIQUE:  Ultrasound examination of the thyroid gland and adjacent soft tissues was performed. COMPARISON:  None. FINDINGS: Parenchymal Echotexture: Markedly heterogenous Isthmus: 0.5 cm Right lobe: 5.8 x 2.3 x 3.1 cm Left lobe: 6.2 x 2.1 x 2.3 cm _________________________________________________________ Estimated total number of nodules >/= 1 cm: 0 Number of spongiform nodules >/=  2 cm not described below (TR1): 0 Number of mixed cystic and solid nodules >/= 1.5 cm not described below (TR2): 0 _________________________________________________________ No discrete nodules are seen within the thyroid gland. The thyroid gland is subjectively hypervascular throughout both lobes. Mildly prominent cervical lymph nodes with a left cervical lymph node measuring 1.2 cm in short axis and a right cervical lymph node measuring 0.9 cm. These are still within normal limits in size and may be  reactive in nature. IMPRESSION: Mildly enlarged, very heterogeneous and hypervascular thyroid gland. 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 No discrete thyroid nodules are identified. The above is in keeping with the ACR TI-RADS recommendations - J Am Coll Radiol 2017;14:587-595. Electronically Signed   By: Aletta Edouard M.D.   On: 10/07/2021 12:28   VAS US RENAL ARTERY DUPLEX  Result Date: 10/07/2021 ABDOMINAL VISCERAL Patient Name:  Judith Lowe  Date of Exam:   10/07/2021 Medical Rec #: [id number]    Accession #:    [id number] Date of Birth: 08/14/1981    Patient Gender: F Patient Age:   15 years Exam Location:  [facility] Procedure:      VAS US RENAL ARTERY DUPLEX Referring Phys: [place] -------------------------------------------------------------------------------- Indications: Hypertension Other Factors: Concern for FMD. Comparison Study: 08-19-2019 CT abdomen/pelvis with contrast showed kidney and                   collecting system within normal limits. Performing Technologist: Darlin Coco RDMS, RVT  Examination Guidelines: A complete evaluation includes B-mode imaging, spectral Doppler, color Doppler, and power Doppler as needed of all accessible portions of each vessel. Bilateral testing is considered an integral part of a complete examination. Limited examinations for reoccurring indications may be performed as noted.  Duplex Findings: +----------------------+--------+--------+------+--------+  Mesenteric             PSV cm/s EDV cm/s Plaque Comments  +----------------------+--------+--------+------+--------+  Aorta Mid                 89                              +----------------------+--------+--------+------+--------+  Celiac Artery Proximal   118                              +----------------------+--------+--------+------+--------+  SMA Origin               115                              +----------------------+--------+--------+------+--------+     +------------------+--------+--------+-------+  Right Renal Artery PSV cm/s EDV cm/s Comment  +------------------+--------+--------+-------+  Origin                88       28             +------------------+--------+--------+-------+  Proximal              66       16             +------------------+--------+--------+-------+  Mid                   76       24             +------------------+--------+--------+-------+  Distal                74       28             +------------------+--------+--------+-------+ +-----------------+--------+--------+-------+  Left Renal Artery PSV cm/s EDV cm/s Comment  +-----------------+--------+--------+-------+  Origin               56       21             +-----------------+--------+--------+-------+  Proximal             64       12             +-----------------+--------+--------+-------+  Mid                  62       23             +-----------------+--------+--------+-------+  Distal               67       23             +-----------------+--------+--------+-------+ +------------+--------+--------+----+-----------+--------+--------+----+  Right Kidney PSV cm/s EDV cm/s RI   Left Kidney PSV cm/s EDV cm/s RI    +------------+--------+--------+----+-----------+--------+--------+----+  Upper Pole   31       10       0.69 Upper Pole  18       7        0.62  +------------+--------+--------+----+-----------+--------+--------+----+  Mid          44       14       0.69 Mid         45       14       0.68  +------------+--------+--------+----+-----------+--------+--------+----+  Lower Pole   27       10       0.64 Lower Pole  34       12       0.63  +------------+--------+--------+----+-----------+--------+--------+----+  Hilar        52       11       0.78 Hilar       48       12       0.75  +------------+--------+--------+----+-----------+--------+--------+----+ +------------------+-----+------------------+-----+  Right Kidney             Left Kidney                +------------------+-----+------------------+-----+  RAR                      RAR                       +------------------+-----+------------------+-----+  RAR (manual)       0.99  RAR (manual)       0.75   +------------------+-----+------------------+-----+  Cortex                   Cortex                    +------------------+-----+------------------+-----+  Cortex thickness         Corex thickness           +------------------+-----+------------------+-----+  Kidney length (cm) 11.72 Kidney length (cm) 11.24  +------------------+-----+------------------+-----+  Summary: Renal:  Right: Normal right Resisitive Index. No evidence of right renal        artery stenosis. RRV flow present. Normal size right kidney. Left:  Normal left Resistive Index. No evidence of left renal artery        stenosis. LRV flow present. Normal size of left kidney.  *See table(s) above for measurements and observations.  Diagnosing physician: Deitra Mayo MD  Electronically signed by Deitra Mayo MD on 10/07/2021 at 4:00:52 PM.    Final      Medications:     Scheduled Medications:  aspirin EC  81 mg Oral Daily   carvedilol  3.125 mg Oral BID WC   docusate sodium  100 mg Oral BID   enoxaparin (LOVENOX) injection  40 mg Subcutaneous Q24H   furosemide  40 mg Intravenous BID   methimazole  10 mg Oral TID   sacubitril-valsartan  1 tablet Oral BID   sodium chloride flush  3 mL Intravenous Q12H   sodium chloride flush  3 mL Intravenous Q12H   spironolactone  25 mg Oral Daily    Infusions:  sodium chloride     sodium chloride 10 mL/hr at 10/08/21 2563   nitroGLYCERIN 15 mcg/min (10/08/21 0220)    PRN Medications: sodium chloride, acetaminophen **OR** acetaminophen, bisacodyl, hydrALAZINE, morphine injection, ondansetron **OR** ondansetron (ZOFRAN) IV, oxyCODONE, polyethylene glycol, sodium chloride flush, traZODone    Patient Profile   40 y.o. AAF with history of polysubstance abuse and HTN. No prior  cardiac history. Now admitted with new systolic HF, hypertensive urgency, iron deficiency anemia.   Assessment/Plan   Acute Systolic Heart Failure - pulmonary edema on CXR, IVC dilated on echo c/w volume overload  - Echo w/ moderately reduced LVEF ~40%, RV ok. No prior studies for comparison  - EKG shows new LBBB. HS trop 43>>44, not c/w ACS - suspect most likely NICM. Suspect Hypertensive CM + Hyperthyroidism. Cocaine and ETOH may also play a roll - Will need R/LHC this admit (12/14 after diuresis)  - cMRI if cath unrevealing (likely at later date, need to give feraheme) - Wean Nitro gtt today. Titrate GDMT/afterload reducing agents - Weight down 19 lb. Diuresed well with IV lasix. Will give one more dose IV lasix this am then decide on further diuresis depending on RHC findings. - Add bidil 20 mg/37.5 mg TID - continue entresto 49/51 mg BID - Continue Spiro 25 mg daily  - continue coreg 3.125 mg BID  - SGLT2i prior to d/c (Hgb A1c ok at 5.7)  - Avoidance of cocaine and reduction in ETOH imperative    2. Hypertension/ Hypertensive Urgency  - markedly HTN on admit. Long standing history but not on meds PTA - also recent cocaine use - hypokalemic on admit. ? Hyperaldosteronism  - renin/aldosterone ratio pending - renal artery dopplers with no evidence of RAS. Normal resistive indices. - initiate GDMT/Antihypertensives as outlined above. Wean nitro gtt today - Treat hyperthyroidism  3. Hyperthyroidism - TSH < 0.010 - Free T3 and Free T4 elevated - Thyroid US consistent with thyroidits - Started on methimazole per TRH   4. Iron Deficiency Anemia - Hgb 9.2 - FOBT negative - Iron panel c/w IDA. Low Ferratin, Iron and Sats - Given feraheme.  - Potentially due to heavy menses   5. Pre-Diabetes - Hgb A1c 5.7 - will need to establish care w/ PCP for further monitoring and preventative care   6.  Polysubstance Use - UDS + for cocaine and THC - cessation from cocaine imperative   - avoid alcohol and tobacco use  7. Hypokalemia/hypomagnesemia - Corrected - Follow  Medication concerns reviewed with patient and pharmacy team. Barriers identified: Has  [employer] commercial insurance through her employer. Concern for poor health literacy level and polysubstance abuse.    Will engage HF SW and pharmacy teams to help w/ medication assistance. May need paramedince to help w/ compliance.    Length of Stay: 2  FINCH, LINDSAY N, PA-C  10/08/2021, 7:54 AM  Advanced Heart Failure Team Pager [phone] (M-F; 7a - 5p)  Please contact [facility] for night-coverage after hours (5p -7a ) and weekends on [email or web address]   Patient seen with PA, agree with the above note.    She diuresed well again last night, weight down.  Creatinine stable.    Hgb better at 9.2, has had feraheme.    Started methimazole for hyperthyroidism.    General: NAD Neck: JVP 8 cm, + thyromegaly.  Lungs: Clear to auscultation bilaterally with normal respiratory effort. CV: Hyperdynamic PMI.  Heart regular S1/S2, no S3/S4, no murmur.  No peripheral edema.   Abdomen: Soft, nontender, no hepatosplenomegaly, no distention.  Skin: Intact without lesions or rashes.  Neurologic: Alert and oriented x 3.  Psych: Normal affect. Extremities: No clubbing or cyanosis.  HEENT: Normal.   1. Acute systolic CHF: Echo this admission with EF 40-45% with inferior/inferoseptal HK, low normal RV function, mild-moderate MR, elevated AoV gradient at 11 mmHg but valve opens normally, dilated IVC, pleural effusion.  Patient abuses cocaine, also smokes and does drink some as well.  She has uncontrolled HTN.  No FH of CMP that she knows of.  Admitted with chest pressure but HS-TnI only mildly elevated with no trend, so doubt ACS.  Do not think new LBBB represents STEMI equivalent.  Cardiomyopathy could be due to longstanding HTN versus cocaine versus ischemic CMP (though probably less likely).  Also suspect hyperthyroidism  may play a role in both cardiomyopathy and HTN.  She has diuresed well, volume status better. - Give 1 more dose IV Lasix.  - Continue Entresto 49/51 bid.  - Continue spironolactone 25 mg daily.  - Agree with addition of Bidil with ongoing HTN.  - Continue Coreg 3.125 mg bid with hyperthyroidism, she has used cocaine but the alpha antagonism should make Coreg relatively safe.  - Wean off NTG gtt.  - Needs to avoid cocaine and ETOH.  - Will arrange for [facility] today.  Discussed risks/benefits with patient and she agrees to procedure.  2. HTN: Long-standing.  Not treated in past, elevated today. Suspect hyperthyroidism plays a role. Renal artery dopplers did not show FMD.  - Agree with checking renin/aldosterone. - Treating hyperthyroidism.  3. Fe deficiency anemia: FOBT negative.  May be due to heavy menses. Has had feraheme.  4. Cocaine abuse: Counseled cessation.  5. Smoking: Counseled cessation.  6. Hyperthyroidism: TSH markedly low, also with tachycardia/weight loss/cardiomyopathy.  ?Graves disease. Thyroid US c/w thyroiditis.  - Continue Coreg.  - Started methimazole.   - Followup with endocrinology as outpatient.   Loralie Champagne 10/08/2021 8:32 AM

## 2021-10-08 NOTE — Progress Notes (Addendum)
CSW was informed in progression that Judith Lowe will need a primary care doctor set up. HF CSW called [facility] and was able to schedule Judith Lowe an appointment for Thursday 10/30/2021 at 9:30am with Dr. Margarita Rana.  HF CSW reached out to [employer] to see about screening Judith Lowe for [facility] as there is no payor source on file.  Rashaud Ybarbo, MSW, [place] Heart Failure Social Worker

## 2021-10-08 NOTE — Progress Notes (Addendum)
Advanced Heart Failure Rounding Note  PCP-Cardiologist: None   Subjective:    Had episode sharp CP around 2 pm. Pain improved with increasing nitro gtt. No recurrence.   Dyspnea improved. Very hungry.  BP remains elevated.   Continues to diurese. Is/Os not accurate d/t unmeasured voids. Weight down another 6 lb.   Objective:   Weight Range: 50.5 kg Body mass index is 16.94 kg/m.   Vital Signs:   Temp:  [97.7 F (36.5 C)-98.5 F (36.9 C)] 97.7 F (36.5 C) (12/14 0311) Pulse Rate:  [95-137] 104 (12/14 0311) Resp:  [18-29] 23 (12/14 0311) BP: (138-153)/(90-115) 148/115 (12/14 0311) SpO2:  [97 %-100 %] 100 % (12/14 0311) Weight:  [50.5 kg] 50.5 kg (12/14 0311) Last BM Date: 10/05/21  Weight change: Filed Weights   10/06/21 1737 10/07/21 0500 10/08/21 0311  Weight: 57.7 kg 53.3 kg 50.5 kg    Intake/Output:   Intake/Output Summary (Last 24 hours) at 10/08/2021 0754 Last data filed at 10/08/2021 0544 Gross per 24 hour  Intake 877.41 ml  Output 2000 ml  Net -1122.59 ml      Physical Exam    General:  AAF appears much older than stated age. Thin. HEENT: normal Neck: supple. no JVD. Carotids 2+ bilat; no bruits. + thyromegaly Cor: PMI nondisplaced. Regular rhythm, tachy.. No rubs, gallops or murmurs. Lungs: clear Abdomen: soft, nontender, nondistended.  Extremities: no cyanosis, clubbing, rash, edema Neuro: alert & orientedx3, cranial nerves grossly intact. moves all 4 extremities w/o difficulty. Affect pleasant   Telemetry   SR/sinus tach, 90s-low 100s  Labs    CBC Recent Labs    10/06/21 0415 10/06/21 0432 10/07/21 0039 10/08/21 0449  WBC 4.4  --  5.5 5.2  NEUTROABS 1.9  --   --   --   HGB 7.4*   < > 7.2* 9.2*  HCT 26.3*   < > 24.5* 32.6*  MCV 67.6*  --  65.9* 67.4*  PLT 280  --  257 301   < > = values in this interval not displayed.   Basic Metabolic Panel Recent Labs    10/07/21 0039 10/08/21 0449  NA 135 135  K 3.1* 4.2  CL 98  99  CO2 28 30  GLUCOSE 166* 117*  BUN 7 8  CREATININE 0.52 0.45  CALCIUM 8.6* 9.0  MG 1.4* 2.2   Liver Function Tests Recent Labs    10/06/21 0415  AST 27  ALT 23  ALKPHOS 86  BILITOT 0.5  PROT 6.8  ALBUMIN 3.2*   No results for input(s): LIPASE, AMYLASE in the last 72 hours. Cardiac Enzymes No results for input(s): CKTOTAL, CKMB, CKMBINDEX, TROPONINI in the last 72 hours.  BNP: BNP (last 3 results) Recent Labs    10/06/21 1636  BNP 2,369.4*    ProBNP (last 3 results) No results for input(s): PROBNP in the last 8760 hours.   D-Dimer No results for input(s): DDIMER in the last 72 hours. Hemoglobin A1C Recent Labs    10/06/21 0415  HGBA1C 5.7*   Fasting Lipid Panel Recent Labs    10/06/21 0415  CHOL 94  HDL 31*  LDLCALC 52  TRIG 54  CHOLHDL 3.0   Thyroid Function Tests Recent Labs    10/06/21 1739 10/07/21 0838  TSH <0.010*  --   T3FREE  --  15.8*    Other results:   Imaging    US THYROID  Result Date: 10/07/2021 CLINICAL DATA:  Hyperthyroid. EXAM: THYROID ULTRASOUND TECHNIQUE:  Ultrasound examination of the thyroid gland and adjacent soft tissues was performed. COMPARISON:  None. FINDINGS: Parenchymal Echotexture: Markedly heterogenous Isthmus: 0.5 cm Right lobe: 5.8 x 2.3 x 3.1 cm Left lobe: 6.2 x 2.1 x 2.3 cm _________________________________________________________ Estimated total number of nodules >/= 1 cm: 0 Number of spongiform nodules >/=  2 cm not described below (TR1): 0 Number of mixed cystic and solid nodules >/= 1.5 cm not described below (TR2): 0 _________________________________________________________ No discrete nodules are seen within the thyroid gland. The thyroid gland is subjectively hypervascular throughout both lobes. Mildly prominent cervical lymph nodes with a left cervical lymph node measuring 1.2 cm in short axis and a right cervical lymph node measuring 0.9 cm. These are still within normal limits in size and may be  reactive in nature. IMPRESSION: Mildly enlarged, very heterogeneous and hypervascular thyroid gland. 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 No discrete thyroid nodules are identified. The above is in keeping with the ACR TI-RADS recommendations - J Am Coll Radiol 2017;14:587-595. Electronically Signed   By: Aletta Edouard M.D.   On: 10/07/2021 12:28   VAS US RENAL ARTERY DUPLEX  Result Date: 10/07/2021 ABDOMINAL VISCERAL Patient Name:  Judith Lowe  Date of Exam:   10/07/2021 Medical Rec #: [id number]    Accession #:    [id number] Date of Birth: 02/24/1981    Patient Gender: F Patient Age:   40 years Exam Location:  [facility] Procedure:      VAS US RENAL ARTERY DUPLEX Referring Phys: [place] -------------------------------------------------------------------------------- Indications: Hypertension Other Factors: Concern for FMD. Comparison Study: 08-19-2019 CT abdomen/pelvis with contrast showed kidney and                   collecting system within normal limits. Performing Technologist: Darlin Coco RDMS, RVT  Examination Guidelines: A complete evaluation includes B-mode imaging, spectral Doppler, color Doppler, and power Doppler as needed of all accessible portions of each vessel. Bilateral testing is considered an integral part of a complete examination. Limited examinations for reoccurring indications may be performed as noted.  Duplex Findings: +----------------------+--------+--------+------+--------+  Mesenteric             PSV cm/s EDV cm/s Plaque Comments  +----------------------+--------+--------+------+--------+  Aorta Mid                 89                              +----------------------+--------+--------+------+--------+  Celiac Artery Proximal   118                              +----------------------+--------+--------+------+--------+  SMA Origin               115                              +----------------------+--------+--------+------+--------+     +------------------+--------+--------+-------+  Right Renal Artery PSV cm/s EDV cm/s Comment  +------------------+--------+--------+-------+  Origin                88       28             +------------------+--------+--------+-------+  Proximal              66       16             +------------------+--------+--------+-------+  Mid                   76       24             +------------------+--------+--------+-------+  Distal                74       28             +------------------+--------+--------+-------+ +-----------------+--------+--------+-------+  Left Renal Artery PSV cm/s EDV cm/s Comment  +-----------------+--------+--------+-------+  Origin               56       21             +-----------------+--------+--------+-------+  Proximal             64       12             +-----------------+--------+--------+-------+  Mid                  62       23             +-----------------+--------+--------+-------+  Distal               67       23             +-----------------+--------+--------+-------+ +------------+--------+--------+----+-----------+--------+--------+----+  Right Kidney PSV cm/s EDV cm/s RI   Left Kidney PSV cm/s EDV cm/s RI    +------------+--------+--------+----+-----------+--------+--------+----+  Upper Pole   31       10       0.69 Upper Pole  18       7        0.62  +------------+--------+--------+----+-----------+--------+--------+----+  Mid          44       14       0.69 Mid         45       14       0.68  +------------+--------+--------+----+-----------+--------+--------+----+  Lower Pole   27       10       0.64 Lower Pole  34       12       0.63  +------------+--------+--------+----+-----------+--------+--------+----+  Hilar        52       11       0.78 Hilar       48       12       0.75  +------------+--------+--------+----+-----------+--------+--------+----+ +------------------+-----+------------------+-----+  Right Kidney             Left Kidney                +------------------+-----+------------------+-----+  RAR                      RAR                       +------------------+-----+------------------+-----+  RAR (manual)       0.99  RAR (manual)       0.75   +------------------+-----+------------------+-----+  Cortex                   Cortex                    +------------------+-----+------------------+-----+  Cortex thickness         Corex thickness           +------------------+-----+------------------+-----+  Kidney length (cm) 11.72 Kidney length (cm) 11.24  +------------------+-----+------------------+-----+  Summary: Renal:  Right: Normal right Resisitive Index. No evidence of right renal        artery stenosis. RRV flow present. Normal size right kidney. Left:  Normal left Resistive Index. No evidence of left renal artery        stenosis. LRV flow present. Normal size of left kidney.  *See table(s) above for measurements and observations.  Diagnosing physician: Deitra Mayo MD  Electronically signed by Deitra Mayo MD on 10/07/2021 at 4:00:52 PM.    Final      Medications:     Scheduled Medications:  aspirin EC  81 mg Oral Daily   carvedilol  3.125 mg Oral BID WC   docusate sodium  100 mg Oral BID   enoxaparin (LOVENOX) injection  40 mg Subcutaneous Q24H   furosemide  40 mg Intravenous BID   methimazole  10 mg Oral TID   sacubitril-valsartan  1 tablet Oral BID   sodium chloride flush  3 mL Intravenous Q12H   sodium chloride flush  3 mL Intravenous Q12H   spironolactone  25 mg Oral Daily    Infusions:  sodium chloride     sodium chloride 10 mL/hr at 10/08/21 4818   nitroGLYCERIN 15 mcg/min (10/08/21 0220)    PRN Medications: sodium chloride, acetaminophen **OR** acetaminophen, bisacodyl, hydrALAZINE, morphine injection, ondansetron **OR** ondansetron (ZOFRAN) IV, oxyCODONE, polyethylene glycol, sodium chloride flush, traZODone    Patient Profile   40 y.o. AAF with history of polysubstance abuse and HTN. No prior  cardiac history. Now admitted with new systolic HF, hypertensive urgency, iron deficiency anemia.   Assessment/Plan   Acute Systolic Heart Failure - pulmonary edema on CXR, IVC dilated on echo c/w volume overload  - Echo w/ moderately reduced LVEF ~40%, RV ok. No prior studies for comparison  - EKG shows new LBBB. HS trop 43>>44, not c/w ACS - suspect most likely NICM. Suspect Hypertensive CM + Hyperthyroidism. Cocaine and ETOH may also play a roll - Will need R/LHC this admit (12/14 after diuresis)  - cMRI if cath unrevealing (likely at later date, need to give feraheme) - Wean Nitro gtt today. Titrate GDMT/afterload reducing agents - Weight down 19 lb. Diuresed well with IV lasix. Will give one more dose IV lasix this am then decide on further diuresis depending on RHC findings. - Add bidil 20 mg/37.5 mg TID - continue entresto 49/51 mg BID - Continue Spiro 25 mg daily  - continue coreg 3.125 mg BID  - SGLT2i prior to d/c (Hgb A1c ok at 5.7)  - Avoidance of cocaine and reduction in ETOH imperative    2. Hypertension/ Hypertensive Urgency  - markedly HTN on admit. Long standing history but not on meds PTA - also recent cocaine use - hypokalemic on admit. ? Hyperaldosteronism  - renin/aldosterone ratio pending - renal artery dopplers with no evidence of RAS. Normal resistive indices. - initiate GDMT/Antihypertensives as outlined above. Wean nitro gtt today - Treat hyperthyroidism  3. Hyperthyroidism - TSH < 0.010 - Free T3 and Free T4 elevated - Thyroid US consistent with thyroidits - Started on methimazole per TRH   4. Iron Deficiency Anemia - Hgb 9.2 - FOBT negative - Iron panel c/w IDA. Low Ferratin, Iron and Sats - Given feraheme.  - Potentially due to heavy menses   5. Pre-Diabetes - Hgb A1c 5.7 - will need to establish care w/ PCP for further monitoring and preventative care   6.  Polysubstance Use - UDS + for cocaine and THC - cessation from cocaine imperative   - avoid alcohol and tobacco use  7. Hypokalemia/hypomagnesemia - Corrected - Follow  Medication concerns reviewed with patient and pharmacy team. Barriers identified: Has  [employer] commercial insurance through her employer. Concern for poor health literacy level and polysubstance abuse.    Will engage HF SW and pharmacy teams to help w/ medication assistance. May need paramedince to help w/ compliance.    Length of Stay: 2  FINCH, Judith N, PA-C  10/08/2021, 7:54 AM  Advanced Heart Failure Team Pager [phone] (M-F; 7a - 5p)  Please contact [facility] for night-coverage after hours (5p -7a ) and weekends on [email or web address]   Patient seen with PA, agree with the above note.    She diuresed well again last night, weight down.  Creatinine stable.    Hgb better at 9.2, has had feraheme.    Started methimazole for hyperthyroidism.    General: NAD Neck: JVP 8 cm, + thyromegaly.  Lungs: Clear to auscultation bilaterally with normal respiratory effort. CV: Hyperdynamic PMI.  Heart regular S1/S2, no S3/S4, no murmur.  No peripheral edema.   Abdomen: Soft, nontender, no hepatosplenomegaly, no distention.  Skin: Intact without lesions or rashes.  Neurologic: Alert and oriented x 3.  Psych: Normal affect. Extremities: No clubbing or cyanosis.  HEENT: Normal.   1. Acute systolic CHF: Echo this admission with EF 40-45% with inferior/inferoseptal HK, low normal RV function, mild-moderate MR, elevated AoV gradient at 11 mmHg but valve opens normally, dilated IVC, pleural effusion.  Patient abuses cocaine, also smokes and does drink some as well.  She has uncontrolled HTN.  No FH of CMP that she knows of.  Admitted with chest pressure but HS-TnI only mildly elevated with no trend, so doubt ACS.  Do not think new LBBB represents STEMI equivalent.  Cardiomyopathy could be due to longstanding HTN versus cocaine versus ischemic CMP (though probably less likely).  Also suspect hyperthyroidism  may play a role in both cardiomyopathy and HTN.  She has diuresed well, volume status better. - Give 1 more dose IV Lasix.  - Continue Entresto 49/51 bid.  - Continue spironolactone 25 mg daily.  - Agree with addition of Bidil with ongoing HTN.  - Continue Coreg 3.125 mg bid with hyperthyroidism, she has used cocaine but the alpha antagonism should make Coreg relatively safe.  - Wean off NTG gtt.  - Needs to avoid cocaine and ETOH.  - Will arrange for [facility] today.  Discussed risks/benefits with patient and she agrees to procedure.  2. HTN: Long-standing.  Not treated in past, elevated today. Suspect hyperthyroidism plays a role. Renal artery dopplers did not show FMD.  - Agree with checking renin/aldosterone. - Treating hyperthyroidism.  3. Fe deficiency anemia: FOBT negative.  May be due to heavy menses. Has had feraheme.  4. Cocaine abuse: Counseled cessation.  5. Smoking: Counseled cessation.  6. Hyperthyroidism: TSH markedly low, also with tachycardia/weight loss/cardiomyopathy.  ?Graves disease. Thyroid US c/w thyroiditis.  - Continue Coreg.  - Started methimazole.   - Followup with endocrinology as outpatient.   Loralie Champagne 10/08/2021 8:32 AM

## 2021-10-09 ENCOUNTER — Encounter (HOSPITAL_COMMUNITY): Payer: Self-pay | Admitting: Cardiology

## 2021-10-09 ENCOUNTER — Other Ambulatory Visit (HOSPITAL_COMMUNITY): Payer: Self-pay

## 2021-10-09 ENCOUNTER — Telehealth (HOSPITAL_COMMUNITY): Payer: Self-pay

## 2021-10-09 DIAGNOSIS — I509 Heart failure, unspecified: Secondary | ICD-10-CM

## 2021-10-09 LAB — BASIC METABOLIC PANEL
Anion gap: 9 (ref 5–15)
BUN: 13 mg/dL (ref 6–20)
CO2: 28 mmol/L (ref 22–32)
Calcium: 8.9 mg/dL (ref 8.9–10.3)
Chloride: 100 mmol/L (ref 98–111)
Creatinine, Ser: 0.54 mg/dL (ref 0.44–1.00)
GFR, Estimated: >60 mL/min (ref >60)
Glucose, Bld: 147 mg/dL — ABNORMAL HIGH (ref 70–99)
Potassium: 4.4 mmol/L (ref 3.5–5.1)
Sodium: 137 mmol/L (ref 135–145)

## 2021-10-09 LAB — HEPATIC FUNCTION PANEL
ALT: 20 U/L (ref 0–44)
AST: 32 U/L (ref 15–41)
Albumin: 2.8 g/dL — ABNORMAL LOW (ref 3.5–5.0)
Alkaline Phosphatase: 88 U/L (ref 38–126)
Bilirubin, Direct: <0.1 mg/dL (ref 0.0–0.2)
Total Bilirubin: 0.3 mg/dL (ref 0.3–1.2)
Total Protein: 6.5 g/dL (ref 6.5–8.1)

## 2021-10-09 LAB — CBC
HCT: 30.2 % — ABNORMAL LOW (ref 36.0–46.0)
Hemoglobin: 8.4 g/dL — ABNORMAL LOW (ref 12.0–15.0)
MCH: 18.8 pg — ABNORMAL LOW (ref 26.0–34.0)
MCHC: 27.8 g/dL — ABNORMAL LOW (ref 30.0–36.0)
MCV: 67.6 fL — ABNORMAL LOW (ref 80.0–100.0)
Platelets: 296 10*3/uL (ref 150–400)
RBC: 4.47 MIL/uL (ref 3.87–5.11)
RDW: 22.5 % — ABNORMAL HIGH (ref 11.5–15.5)
WBC: 6.3 10*3/uL (ref 4.0–10.5)
nRBC: 0.5 % — ABNORMAL HIGH (ref 0.0–0.2)

## 2021-10-09 MED ORDER — CARVEDILOL 6.25 MG PO TABS
6.2500 mg | ORAL_TABLET | Freq: Two times a day (BID) | ORAL | 0 refills | Status: DC
  Filled 2021-10-09: qty 60, 30d supply, fill #0

## 2021-10-09 MED ORDER — CARVEDILOL 6.25 MG PO TABS: 6.2500 mg | ORAL_TABLET | Freq: Two times a day (BID) | ORAL | Status: DC

## 2021-10-09 MED ORDER — HYDRALAZINE HCL 25 MG PO TABS
25.0000 mg | ORAL_TABLET | Freq: Three times a day (TID) | ORAL | 11 refills | Status: DC
  Filled 2021-10-09: qty 90, 30d supply, fill #0

## 2021-10-09 MED ORDER — ISOSORB DINITRATE-HYDRALAZINE 20-37.5 MG PO TABS
1.0000 | ORAL_TABLET | Freq: Three times a day (TID) | ORAL | 0 refills | Status: DC
  Filled 2021-10-09: qty 90, 30d supply, fill #0

## 2021-10-09 MED ORDER — SACUBITRIL-VALSARTAN 49-51 MG PO TABS
1.0000 | ORAL_TABLET | Freq: Two times a day (BID) | ORAL | 0 refills | Status: AC
  Filled 2021-10-09: qty 60, 30d supply, fill #0

## 2021-10-09 MED ORDER — SPIRONOLACTONE 25 MG PO TABS
25.0000 mg | ORAL_TABLET | Freq: Every day | ORAL | 0 refills | Status: DC
  Filled 2021-10-09: qty 30, 30d supply, fill #0

## 2021-10-09 MED ORDER — ISOSORBIDE MONONITRATE ER 30 MG PO TB24
30.0000 mg | ORAL_TABLET | Freq: Every day | ORAL | 11 refills | Status: DC
  Filled 2021-10-09: qty 30, 30d supply, fill #0

## 2021-10-09 MED ORDER — METHIMAZOLE 10 MG PO TABS
10.0000 mg | ORAL_TABLET | Freq: Three times a day (TID) | ORAL | 0 refills | Status: DC
  Filled 2021-10-09: qty 90, 30d supply, fill #0

## 2021-10-09 NOTE — Discharge Summary (Signed)
Physician Discharge Summary  Judith Lowe EXB:[id number] DOB: [birth year] DOA: 10/06/2021  PCP: Patient, No Pcp Per (Inactive)  Admit date: 10/06/2021 Discharge date: 10/09/2021  Admitted From: home Disposition:  home  Recommendations for Outpatient Follow-up:  Follow up with PCP in 1-2 weeks Follow-up with advanced heart failure team You will need outpatient endocrinology follow-up Please obtain BMP/CBC in one week  Home Health:no  Equipment/Devices: none  Discharge Condition: Stable Code Status:   Code Status: Full Code Diet recommendation:  Diet Order             Diet Heart Room service appropriate? Yes; Fluid consistency: Thin  Diet effective now                    Brief/Interim Summary:   Judith Lowe, 40 y.o. female with PMH of polysubstance abuse presented with shortness of breath and orthopnea for 2 to 3 days along with mild cough.  She denied use of drug use and expressed surprise that cardiologist came back positive for cocaine, acknowledges marijuana use.  Initially needing BiPAP.  She had some chest tightness, work-up subsequently found out that she had systolic congestive heart failure along with hyperthyroidism seen by advanced heart failure team placed on IV diuretics, also started on methimazole.  Patient underwent cardiac cath 12/14-no significant CAD, nonischemic cardiomyopathy with low filling pressures preserved cardiac output.  Did well post cath seen by CHF team 12/15 a.m. doing well on room air and volume status stable, cleared for discharge home with CHF meds which will be arranged provided with prior to discharge, PCP appointment has been set up she will also follow-up with CHF team, will need outpatient endocrinology follow-up   Discharge Diagnoses:   Acute systolic heart failure Nonischemic cardiomyopathy-hypertensive/cocaine EtOH induced Demand ischemia New LBBB: Patient with new onset, EF~40%, RV okay, no previous echo.  EKG with new LBBB with  troponin leak 43>> 44 flat from demand ischemia due to CHF.  Suspecting hypertensive cardiomyopathy and possible cocaine and alcohol contribution.  S/P IV diuresis.  For discharge home she will continue with Lasix as needed, Entresto 49/51 twice daily, Aldactone 25 daily, BiDil, Coreg 6.25 twice daily. S/P cardiac cath 12/14NICM, no CAD.   Hypertension urgency: Uncontrolled BPon admission.  Also with recent cocaine use.  Follow-up Renin/angiotension ration to r/o hyperaldosteronism, renal artery Doppler no RAS, continue heart failure meds to help with blood pressure-at this time remains well controlled.  Hyperthyroidism: TSH <0.01 Free T4 =4, thyroid ultrasound showing thyroiditis, per guideline in the setting of decompensated CHF on: since T4 > 2X- per protocol placed on methimazole 10 mg tid- will need outpatient close follow-up with endocrinology PCP to monitor and adjust dose - Assess free T4 and total T3 at 4- to 6-week intervals; when normal, reduce dose by 30% to 50% and repeat thyroid function tests in 4 to 6 weeks; usually maintenance dose 5-10 mg daily  IDA: FOBT negative low ferritin iron level-given Feraheme, likely from her heavy menstruation, monitor and continue iron supplementation as outpatient.Stable H/H overall. Recent Labs  Lab 10/07/21 0039 10/08/21 0449 10/08/21 1528 10/08/21 1656 10/09/21 0235  HGB 7.2* 9.2* 11.6*   11.6* 9.1* 8.4*  HCT 24.5* 32.6* 34.0*   34.0* 33.2* 30.2*   Hypokalemia Hypomagnesemia: Stable  Prediabetes hemoglobin A1c 5.7 dietary restriction outpatient follow-up  Polysubstance abuse with cocaine and marijuana, alcohol: Cessation has been discussed extensively in the light of her decompensated CHF  Low BMI 16.9-augment diet as tolerated  Consults: Advanced heart  failure team  Subjective: Alert awake oriented x3 On R, Doing well.  Discharge Exam: Vitals:   10/09/21 0344 10/09/21 0602  BP: (!) 124/92 131/82  Pulse: (!) 109   Resp: 20    Temp: 98.2 F (36.8 C)   SpO2: 100%    General: Pt is alert, awake, not in acute distress Cardiovascular: RRR, S1/S2 +, no rubs, no gallops Respiratory: CTA bilaterally, no wheezing, no rhonchi Abdominal: Soft, NT, ND, bowel sounds + Extremities: no edema, no cyanosis  Discharge Instructions  Discharge Instructions     (HEART FAILURE PATIENTS) Call MD:  Anytime you have any of the following symptoms: 1) 3 pound weight gain in 24 hours or 5 pounds in 1 week 2) shortness of breath, with or without a dry hacking cough 3) swelling in the hands, feet or stomach 4) if you have to sleep on extra pillows at night in order to breathe.   Complete by: As directed    Discharge instructions   Complete by: As directed    Follow-up with primary care doctor endocrines doctor and heart failure team.  You will need to repeat your TSH and free T3-T4 thyroid function in 4-6 wks and based on the results decrease the dose of methimazole accordingly so follow-up with your PCP or endocrinology  Please call call MD or return to ER for similar or worsening recurring problem that brought you to hospital or if any fever,nausea/vomiting,abdominal pain, uncontrolled pain, chest pain,  shortness of breath or any other alarming symptoms.  Please follow-up your doctor as instructed in a week time and call the office for appointment.  Please avoid alcohol, smoking, or any other illicit substance and maintain healthy habits including taking your regular medications as prescribed.  You were cared for by a hospitalist during your hospital stay. If you have any questions about your discharge medications or the care you received while you were in the hospital after you are discharged, you can call the unit and ask to speak with the hospitalist on call if the hospitalist that took care of you is not available.  Once you are discharged, your primary care physician will handle any further medical issues. Please note that NO  REFILLS for any discharge medications will be authorized once you are discharged, as it is imperative that you return to your primary care physician (or establish a relationship with a primary care physician if you do not have one) for your aftercare needs so that they can reassess your need for medications and monitor your lab values   Increase activity slowly   Complete by: As directed       Allergies as of 10/09/2021   No Known Allergies      Medication List     STOP taking these medications    ALKA SELTZER PLUS PO   ibuprofen 200 MG tablet Commonly known as: ADVIL       TAKE these medications    carvedilol 6.25 MG tablet Commonly known as: COREG Take 1 tablet (6.25 mg total) by mouth 2 (two) times daily with a meal.   isosorbide-hydrALAZINE 20-37.5 MG tablet Commonly known as: BIDIL Take 1 tablet by mouth 3 (three) times daily.   methimazole 10 MG tablet Commonly known as: TAPAZOLE Take 1 tablet (10 mg total) by mouth 3 (three) times daily.   sacubitril-valsartan 49-51 MG Commonly known as: ENTRESTO Take 1 tablet by mouth 2 (two) times daily.   spironolactone 25 MG tablet Commonly known as: ALDACTONE Take  1 tablet (25 mg total) by mouth daily.        Follow-up Information     Charlott Rakes, MD. Go in 20 day(s).   Specialty: Family Medicine Why: Primary Care appointment and hospital follow up scheduled for Thursday 10/30/2021 at 9:30am with Dr. Margarita Rana. Contact information: [place] [address] [phone]         [facility]-ENDOCRINOLOGY Follow up.          Philemon Kingdom, MD. Call in 4 week(s).   Specialty: Internal Medicine Contact information: [address] [place] [id number] [phone]                No Known Allergies  The results of significant diagnostics from this hospitalization (including imaging, microbiology, ancillary and laboratory) are listed below for reference.     Microbiology: Recent Results (from the past 240 hour(s))  Resp Panel by RT-PCR (Flu A&B, Covid) Nasopharyngeal Swab     Status: None   Collection Time: 10/06/21  4:30 AM   Specimen: Nasopharyngeal Swab; Nasopharyngeal(NP) swabs in vial transport medium  Result Value Ref Range Status   SARS Coronavirus 2 by RT PCR NEGATIVE NEGATIVE Final    Comment: (NOTE) SARS-CoV-2 target nucleic acids are NOT DETECTED.  The SARS-CoV-2 RNA is generally detectable in upper respiratory specimens during the acute phase of infection. The lowest concentration of SARS-CoV-2 viral copies this assay can detect is 138 copies/mL. A negative result does not preclude SARS-Cov-2 infection and should not be used as the sole basis for treatment or other patient management decisions. A negative result may occur with  improper specimen collection/handling, submission of specimen other than nasopharyngeal swab, presence of viral mutation(s) within the areas targeted by this assay, and inadequate number of viral copies(<138 copies/mL). A negative result must be combined with clinical observations, patient history, and epidemiological information. The expected result is Negative.  Fact Sheet for Patients:  EntrepreneurPulse.com.au  Fact Sheet for Healthcare Providers:  IncredibleEmployment.be  This test is no t yet approved or cleared by the [place] FDA and  has been authorized for detection and/or diagnosis of SARS-CoV-2 by FDA under an Emergency Use Authorization (EUA). This EUA will remain  in effect (meaning this test can be used) for the duration of the COVID-19 declaration under Section 564(b)(1) of the Act, 21 U.S.C.section 360bbb-3(b)(1), unless the authorization is terminated  or revoked sooner.       Influenza A by PCR NEGATIVE NEGATIVE Final   Influenza B by PCR NEGATIVE NEGATIVE Final    Comment: (NOTE) The Xpert Xpress SARS-CoV-2/FLU/RSV plus assay is  intended as an aid in the diagnosis of influenza from Nasopharyngeal swab specimens and should not be used as a sole basis for treatment. Nasal washings and aspirates are unacceptable for Xpert Xpress SARS-CoV-2/FLU/RSV testing.  Fact Sheet for Patients: EntrepreneurPulse.com.au  Fact Sheet for Healthcare Providers: IncredibleEmployment.be  This test is not yet approved or cleared by the [place] FDA and has been authorized for detection and/or diagnosis of SARS-CoV-2 by FDA under an Emergency Use Authorization (EUA). This EUA will remain in effect (meaning this test can be used) for the duration of the COVID-19 declaration under Section 564(b)(1) of the Act, 21 U.S.C. section 360bbb-3(b)(1), unless the authorization is terminated or revoked.  Performed at [facility], [place] [address], [place], [place] [address]     Procedures/Studies: CARDIAC CATHETERIZATION  Result Date: 10/09/2021 1. No significant CAD.  Nonischemic cardiomyopathy. 2. Low filling  pressures. 3. Preserved cardiac output.   DG Chest Port 1 View  Result Date: 10/06/2021 CLINICAL DATA:  Chest pain.  Shortness of breath. EXAM: PORTABLE CHEST 1 VIEW COMPARISON:  06/18/2013. FINDINGS: The heart is enlarged and the pulmonary vasculature is distended. Patchy airspace disease is present at the lung bases. There are small bilateral pleural effusions. No pneumothorax. No acute osseous abnormality. IMPRESSION: 1. Cardiomegaly with pulmonary vascular congestion. 2. Patchy airspace disease at the lung bases, possible edema, infiltrate, or atelectasis. 3. Small bilateral pleural effusions. Electronically Signed   By: Brett Fairy M.D.   On: 10/06/2021 04:14   ECHOCARDIOGRAM COMPLETE  Result Date: 10/06/2021    ECHOCARDIOGRAM REPORT   Patient Name:   Judith Lowe Date of Exam: 10/06/2021 Medical Rec #:  [id number]   Height:       69.0 in Accession #:    [id number]  Weight:        131.0 lb Date of Birth:  09-Jun-1981   BSA:          1.726 m Patient Age:    65 years    BP:           135/87 mmHg Patient Gender: F           HR:           999 bpm. Exam Location:  Inpatient Procedure: 2D Echo, Color Doppler and Cardiac Doppler Indications:    CHF  History:        Patient has no prior history of Echocardiogram examinations.  Sonographer:    Jyl Heinz Referring Phys: [place]  1. LVEF is depressed with hypokinesis in the inferoseptal, inferior walls . Left ventricular ejection fraction, by estimation, is 40 to 45%. The left ventricle has mildly decreased function. The left ventricular internal cavity size was mildly dilated. There is mild left ventricular hypertrophy. Indeterminate diastolic filling due to E-A fusion.  2. Right ventricular systolic function is low normal. The right ventricular size is normal.  3. Left atrial size was moderately dilated.  4. Large pleural effusion.  5. The mitral valve is normal in structure. Mild to moderate mitral valve regurgitation.  6. AV is thickened with minimally restricted motion. Mean gradient through the valve is 11 mm Hg. Marland Kitchen Aortic valve regurgitation is not visualized. Aortic valve sclerosis/calcification is present, without any evidence of aortic stenosis.  7. The inferior vena cava is dilated in size with <50% respiratory variability, suggesting right atrial pressure of 15 mmHg. FINDINGS  Left Ventricle: LVEF is depressed with hypokinesis in the inferoseptal, inferior walls. Left ventricular ejection fraction, by estimation, is 40 to 45%. The left ventricle has mildly decreased function. The left ventricular internal cavity size was mildly dilated. There is mild left ventricular hypertrophy. Indeterminate diastolic filling due to E-A fusion. Right Ventricle: The right ventricular size is normal. Right vetricular wall thickness was not assessed. Right ventricular systolic function is low normal. Left Atrium: Left atrial size was  moderately dilated. Right Atrium: Right atrial size was normal in size. Pericardium: Trivial pericardial effusion is present. Mitral Valve: The mitral valve is normal in structure. Mild to moderate mitral valve regurgitation. Tricuspid Valve: The tricuspid valve is normal in structure. Tricuspid valve regurgitation is mild. Aortic Valve: AV is thickened with minimally restricted motion. Mean gradient through the valve is 11 mm Hg. Aortic valve regurgitation is not visualized. Aortic valve sclerosis/calcification is present, without any evidence of aortic stenosis. Aortic valve mean gradient measures 11.5 mmHg. Aortic  valve peak gradient measures 20.8 mmHg. Aortic valve area, by VTI measures 2.92 cm. Pulmonic Valve: The pulmonic valve was not well visualized. Pulmonic valve regurgitation is not visualized. Aorta: The aortic root and ascending aorta are structurally normal, with no evidence of dilitation. Venous: The inferior vena cava is dilated in size with less than 50% respiratory variability, suggesting right atrial pressure of 15 mmHg. IAS/Shunts: No atrial level shunt detected by color flow Doppler. Additional Comments: There is a large pleural effusion.  LEFT VENTRICLE PLAX 2D LVIDd:         5.50 cm      Diastology LVIDs:         3.80 cm      LV e' medial:    7.83 cm/s LV PW:         1.20 cm      LV E/e' medial:  15.8 LV IVS:        1.00 cm      LV e' lateral:   9.03 cm/s LVOT diam:     2.00 cm      LV E/e' lateral: 13.7 LV SV:         106 LV SV Index:   61 LVOT Area:     3.14 cm  LV Volumes (MOD) LV vol d, MOD A2C: 179.0 ml LV vol d, MOD A4C: 150.0 ml LV vol s, MOD A2C: 105.0 ml LV vol s, MOD A4C: 86.5 ml LV SV MOD A2C:     74.0 ml LV SV MOD A4C:     150.0 ml LV SV MOD BP:      72.9 ml RIGHT VENTRICLE             IVC RV Basal diam:  4.10 cm     IVC diam: 2.70 cm RV Mid diam:    2.70 cm RV S prime:     16.40 cm/s TAPSE (M-mode): 3.0 cm LEFT ATRIUM            Index        RIGHT ATRIUM           Index LA diam:       4.20 cm  2.43 cm/m   RA Area:     17.40 cm LA Vol (A2C): 103.0 ml 59.69 ml/m  RA Volume:   44.70 ml  25.90 ml/m  AORTIC VALVE AV Area (Vmax):    2.80 cm AV Area (Vmean):   2.87 cm AV Area (VTI):     2.92 cm AV Vmax:           228.00 cm/s AV Vmean:          162.000 cm/s AV VTI:            0.362 m AV Peak Grad:      20.8 mmHg AV Mean Grad:      11.5 mmHg LVOT Vmax:         203.00 cm/s LVOT Vmean:        148.000 cm/s LVOT VTI:          0.337 m LVOT/AV VTI ratio: 0.93  AORTA Ao Root diam: 2.60 cm Ao Asc diam:  2.70 cm MITRAL VALVE                TRICUSPID VALVE MV Area (PHT): 7.59 cm     TR Peak grad:   36.7 mmHg MV Decel Time: 100 msec     TR Vmax:        303.00  cm/s MR Peak grad: 134.3 mmHg MR Mean grad: 92.5 mmHg     SHUNTS MR Vmax:      579.50 cm/s   Systemic VTI:  0.34 m MR Vmean:     467.5 cm/s    Systemic Diam: 2.00 cm MV E velocity: 124.00 cm/s MV A velocity: 140.00 cm/s MV E/A ratio:  0.89 Dorris Carnes MD Electronically signed by Dorris Carnes MD Signature Date/Time: 10/06/2021/4:14:04 PM    Final    US THYROID  Result Date: 10/07/2021 CLINICAL DATA:  Hyperthyroid. EXAM: THYROID ULTRASOUND TECHNIQUE: Ultrasound examination of the thyroid gland and adjacent soft tissues was performed. COMPARISON:  None. FINDINGS: Parenchymal Echotexture: Markedly heterogenous Isthmus: 0.5 cm Right lobe: 5.8 x 2.3 x 3.1 cm Left lobe: 6.2 x 2.1 x 2.3 cm _________________________________________________________ Estimated total number of nodules >/= 1 cm: 0 Number of spongiform nodules >/=  2 cm not described below (TR1): 0 Number of mixed cystic and solid nodules >/= 1.5 cm not described below (TR2): 0 _________________________________________________________ No discrete nodules are seen within the thyroid gland. The thyroid gland is subjectively hypervascular throughout both lobes. Mildly prominent cervical lymph nodes with a left cervical lymph node measuring 1.2 cm in short axis and a right cervical lymph node  measuring 0.9 cm. These are still within normal limits in size and may be reactive in nature. IMPRESSION: Mildly enlarged, very heterogeneous and hypervascular thyroid gland. 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 No discrete thyroid nodules are identified. The above is in keeping with the ACR TI-RADS recommendations - J Am Coll Radiol 2017;14:587-595. Electronically Signed   By: Aletta Edouard M.D.   On: 10/07/2021 12:28   VAS US RENAL ARTERY DUPLEX  Result Date: 10/07/2021 ABDOMINAL VISCERAL Patient Name:  Judith Lowe  Date of Exam:   10/07/2021 Medical Rec #: [id number]    Accession #:    [id number] Date of Birth: 19-Jan-1981    Patient Gender: F Patient Age:   69 years Exam Location:  [facility] Procedure:      VAS US RENAL ARTERY DUPLEX Referring Phys: [place] -------------------------------------------------------------------------------- Indications: Hypertension Other Factors: Concern for FMD. Comparison Study: 08-19-2019 CT abdomen/pelvis with contrast showed kidney and                   collecting system within normal limits. Performing Technologist: Darlin Coco RDMS, RVT  Examination Guidelines: A complete evaluation includes B-mode imaging, spectral Doppler, color Doppler, and power Doppler as needed of all accessible portions of each vessel. Bilateral testing is considered an integral part of a complete examination. Limited examinations for reoccurring indications may be performed as noted.  Duplex Findings: +----------------------+--------+--------+------+--------+  Mesenteric             PSV cm/s EDV cm/s Plaque Comments  +----------------------+--------+--------+------+--------+  Aorta Mid                 89                              +----------------------+--------+--------+------+--------+  Celiac Artery Proximal   118                              +----------------------+--------+--------+------+--------+  SMA Origin               115                               +----------------------+--------+--------+------+--------+    +------------------+--------+--------+-------+  Right Renal Artery PSV cm/s EDV cm/s Comment  +------------------+--------+--------+-------+  Origin                88       28             +------------------+--------+--------+-------+  Proximal              66       16             +------------------+--------+--------+-------+  Mid                   76       24             +------------------+--------+--------+-------+  Distal                74       28             +------------------+--------+--------+-------+ +-----------------+--------+--------+-------+  Left Renal Artery PSV cm/s EDV cm/s Comment  +-----------------+--------+--------+-------+  Origin               56       21             +-----------------+--------+--------+-------+  Proximal             64       12             +-----------------+--------+--------+-------+  Mid                  62       23             +-----------------+--------+--------+-------+  Distal               67       23             +-----------------+--------+--------+-------+ +------------+--------+--------+----+-----------+--------+--------+----+  Right Kidney PSV cm/s EDV cm/s RI   Left Kidney PSV cm/s EDV cm/s RI    +------------+--------+--------+----+-----------+--------+--------+----+  Upper Pole   31       10       0.69 Upper Pole  18       7        0.62  +------------+--------+--------+----+-----------+--------+--------+----+  Mid          44       14       0.69 Mid         45       14       0.68  +------------+--------+--------+----+-----------+--------+--------+----+  Lower Pole   27       10       0.64 Lower Pole  34       12       0.63  +------------+--------+--------+----+-----------+--------+--------+----+  Hilar        52       11       0.78 Hilar       48       12       0.75  +------------+--------+--------+----+-----------+--------+--------+----+ +------------------+-----+------------------+-----+  Right  Kidney             Left Kidney               +------------------+-----+------------------+-----+  RAR                      RAR                       +------------------+-----+------------------+-----+  RAR (manual)       0.99  RAR (manual)       0.75   +------------------+-----+------------------+-----+  Cortex                   Cortex                    +------------------+-----+------------------+-----+  Cortex thickness         Corex thickness           +------------------+-----+------------------+-----+  Kidney length (cm) 11.72 Kidney length (cm) 11.24  +------------------+-----+------------------+-----+  Summary: Renal:  Right: Normal right Resisitive Index. No evidence of right renal        artery stenosis. RRV flow present. Normal size right kidney. Left:  Normal left Resistive Index. No evidence of left renal artery        stenosis. LRV flow present. Normal size of left kidney.  *See table(s) above for measurements and observations.  Diagnosing physician: Deitra Mayo MD  Electronically signed by Deitra Mayo MD on 10/07/2021 at 4:00:52 PM.    Final     Labs: BNP (last 3 results) Recent Labs    10/06/21 1636  BNP 6,734.1*   Basic Metabolic Panel: Recent Labs  Lab 10/06/21 0415 10/06/21 0432 10/06/21 0442 10/07/21 0039 10/08/21 0449 10/08/21 1528 10/08/21 1656 10/09/21 0235  NA 136   < > 139 135 135 141   141  --  137  K 3.4*   < > 3.3* 3.1* 4.2 3.9   3.9  --  4.4  CL 102  --   --  98 99  --   --  100  CO2 25  --   --  28 30  --   --  28  GLUCOSE 97  --   --  166* 117*  --   --  147*  BUN 10  --   --  7 8  --   --  13  CREATININE 0.42*  --   --  0.52 0.45  --  0.51 0.54  CALCIUM 8.8*  --   --  8.6* 9.0  --   --  8.9  MG  --   --   --  1.4* 2.2  --   --   --    < > = values in this interval not displayed.   Liver Function Tests: Recent Labs  Lab 10/06/21 0415 10/09/21 0235  AST 27 32  ALT 23 20  ALKPHOS 86 88  BILITOT 0.5 0.3  PROT 6.8 6.5  ALBUMIN 3.2*  2.8*   No results for input(s): LIPASE, AMYLASE in the last 168 hours. No results for input(s): AMMONIA in the last 168 hours. CBC: Recent Labs  Lab 10/06/21 0415 10/06/21 0432 10/07/21 0039 10/08/21 0449 10/08/21 1528 10/08/21 1656 10/09/21 0235  WBC 4.4  --  5.5 5.2  --  6.0 6.3  NEUTROABS 1.9  --   --   --   --   --   --   HGB 7.4*   < > 7.2* 9.2* 11.6*   11.6* 9.1* 8.4*  HCT 26.3*   < > 24.5* 32.6* 34.0*   34.0* 33.2* 30.2*  MCV 67.6*  --  65.9* 67.4*  --  68.2* 67.6*  PLT 280  --  257 301  --  312 296   < > = values in this interval not displayed.   Cardiac Enzymes: No results for input(s): CKTOTAL, CKMB, CKMBINDEX, TROPONINI in the  last 168 hours. BNP: Invalid input(s): POCBNP CBG: No results for input(s): GLUCAP in the last 168 hours. D-Dimer No results for input(s): DDIMER in the last 72 hours. Hgb A1c No results for input(s): HGBA1C in the last 72 hours. Lipid Profile No results for input(s): CHOL, HDL, LDLCALC, TRIG, CHOLHDL, LDLDIRECT in the last 72 hours. Thyroid function studies Recent Labs    10/06/21 1739 10/07/21 0838  TSH <0.010*  --   T3FREE  --  15.8*   Anemia work up No results for input(s): VITAMINB12, FOLATE, FERRITIN, TIBC, IRON, RETICCTPCT in the last 72 hours. Urinalysis    Component Value Date/Time   COLORURINE STRAW (A) 10/06/2021 0548   APPEARANCEUR CLEAR 10/06/2021 0548   LABSPEC 1.008 10/06/2021 0548   PHURINE 7.0 10/06/2021 0548   GLUCOSEU NEGATIVE 10/06/2021 0548   HGBUR NEGATIVE 10/06/2021 0548   BILIRUBINUR NEGATIVE 10/06/2021 0548   KETONESUR NEGATIVE 10/06/2021 0548   PROTEINUR NEGATIVE 10/06/2021 0548   UROBILINOGEN 1.0 12/11/2013 1405   NITRITE NEGATIVE 10/06/2021 0548   LEUKOCYTESUR NEGATIVE 10/06/2021 0548   Sepsis Labs Invalid input(s): PROCALCITONIN,  WBC,  LACTICIDVEN Microbiology Recent Results (from the past 240 hour(s))  Resp Panel by RT-PCR (Flu A&B, Covid) Nasopharyngeal Swab     Status: None   Collection  Time: 10/06/21  4:30 AM   Specimen: Nasopharyngeal Swab; Nasopharyngeal(NP) swabs in vial transport medium  Result Value Ref Range Status   SARS Coronavirus 2 by RT PCR NEGATIVE NEGATIVE Final    Comment: (NOTE) SARS-CoV-2 target nucleic acids are NOT DETECTED.  The SARS-CoV-2 RNA is generally detectable in upper respiratory specimens during the acute phase of infection. The lowest concentration of SARS-CoV-2 viral copies this assay can detect is 138 copies/mL. A negative result does not preclude SARS-Cov-2 infection and should not be used as the sole basis for treatment or other patient management decisions. A negative result may occur with  improper specimen collection/handling, submission of specimen other than nasopharyngeal swab, presence of viral mutation(s) within the areas targeted by this assay, and inadequate number of viral copies(<138 copies/mL). A negative result must be combined with clinical observations, patient history, and epidemiological information. The expected result is Negative.  Fact Sheet for Patients:  EntrepreneurPulse.com.au  Fact Sheet for Healthcare Providers:  IncredibleEmployment.be  This test is no t yet approved or cleared by the [place] FDA and  has been authorized for detection and/or diagnosis of SARS-CoV-2 by FDA under an Emergency Use Authorization (EUA). This EUA will remain  in effect (meaning this test can be used) for the duration of the COVID-19 declaration under Section 564(b)(1) of the Act, 21 U.S.C.section 360bbb-3(b)(1), unless the authorization is terminated  or revoked sooner.       Influenza A by PCR NEGATIVE NEGATIVE Final   Influenza B by PCR NEGATIVE NEGATIVE Final    Comment: (NOTE) The Xpert Xpress SARS-CoV-2/FLU/RSV plus assay is intended as an aid in the diagnosis of influenza from Nasopharyngeal swab specimens and should not be used as a sole basis for treatment. Nasal washings  and aspirates are unacceptable for Xpert Xpress SARS-CoV-2/FLU/RSV testing.  Fact Sheet for Patients: EntrepreneurPulse.com.au  Fact Sheet for Healthcare Providers: IncredibleEmployment.be  This test is not yet approved or cleared by the [place] FDA and has been authorized for detection and/or diagnosis of SARS-CoV-2 by FDA under an Emergency Use Authorization (EUA). This EUA will remain in effect (meaning this test can be used) for the duration of the COVID-19 declaration under Section 564(b)(1) of  the Act, 21 U.S.C. section 360bbb-3(b)(1), unless the authorization is terminated or revoked.  Performed at [facility], [place] [address], [place], [place] [address]      Time coordinating discharge: 35 minutes  SIGNED: Antonieta Pert, MD  [facility] 10/09/2021, 9:50 AM  If 7PM-7AM, please contact night-coverage [email or web address]

## 2021-10-09 NOTE — TOC Transition Note (Signed)
Transition of Care [facility]) - CM/SW Discharge Note   Patient Details  Name: Lanaiya Lantry MRN: [id number] Date of Birth: 01/02/1981  [facility]) CM/SW Contact:  Zenon Mayo, RN Phone Number: 10/09/2021, 10:08 AM   Clinical Narrative:    HF TOC CM entered [place]. Will use HF funds for heart medications. Spoke to pt and she does work. Will need note for work. Medications will come up from [place]. Her husband will be at home to assist with care. Appt for [facility] on 10/30/2021 is in chart per  Jonnie Finner RN3 CCM, Heart Failure .         Patient Goals and CMS Choice        Discharge Placement                       Discharge Plan and Services                                     Social Determinants of Health (SDOH) Interventions     Readmission Risk Interventions No flowsheet data found.

## 2021-10-09 NOTE — Progress Notes (Addendum)
Patient ID: Judith Lowe, female   DOB: 1981/09/05, 40 y.o.   MRN: [id number]     Advanced Heart Failure Rounding Note  PCP-Cardiologist: None   Subjective:    No complaints this morning.  Feels better.  HR down to 90s. SBP 130s.    LHC/RHC:  Coronary Findings  Diagnostic Dominance: Right Left Main  Vessel was injected. Vessel is normal in caliber. Vessel is angiographically normal.    Left Anterior Descending  Vessel was injected. Vessel is normal in caliber. Vessel is angiographically normal.    Left Circumflex  Vessel was injected. Vessel is normal in caliber. Vessel is angiographically normal.    Right Coronary Artery  Vessel was injected. Vessel is normal in caliber. Vessel is angiographically normal.    Intervention   No interventions have been documented.   Right Heart  Right Heart Pressures RHC Procedural Findings: Hemodynamics (mmHg) RA mean 1 RV 20/1 PA 22/5, mean 11 PCWP mean 5 LV 133/5 AO 137/80  Oxygen saturations: PA 72% AO 100%  Cardiac Output (Fick) 6.08  Cardiac Index (Fick) 3.8    Objective:   Weight Range: 51.5 kg Body mass index is 17.27 kg/m.   Vital Signs:   Temp:  [97.5 F (36.4 C)-98.7 F (37.1 C)] 98.2 F (36.8 C) (12/15 0344) Pulse Rate:  [90-109] 109 (12/15 0344) Resp:  [13-24] 20 (12/15 0344) BP: (121-139)/(79-93) 131/82 (12/15 0602) SpO2:  [0 %-100 %] 100 % (12/15 0344) Weight:  [51.5 kg] 51.5 kg (12/15 0344) Last BM Date: 10/05/21  Weight change: Filed Weights   10/07/21 0500 10/08/21 0311 10/09/21 0344  Weight: 53.3 kg 50.5 kg 51.5 kg    Intake/Output:   Intake/Output Summary (Last 24 hours) at 10/09/2021 0903 Last data filed at 10/09/2021 0804 Gross per 24 hour  Intake 1433.59 ml  Output 1375 ml  Net 58.59 ml      Physical Exam    General: NAD Neck: No JVD, +Thyromegaly Lungs: Clear to auscultation bilaterally with normal respiratory effort. CV: Nondisplaced PMI.  Heart regular S1/S2, no S3/S4, no  murmur.  No peripheral edema.  No carotid bruit.  Normal pedal pulses.  Abdomen: Soft, nontender, no hepatosplenomegaly, no distention.  Skin: Intact without lesions or rashes.  Neurologic: Alert and oriented x 3.  Psych: Normal affect. Extremities: No clubbing or cyanosis.  HEENT: Normal.    Telemetry   NSR 90s, personally reviewed.   Labs    CBC Recent Labs    10/08/21 1656 10/09/21 0235  WBC 6.0 6.3  HGB 9.1* 8.4*  HCT 33.2* 30.2*  MCV 68.2* 67.6*  PLT 312 401   Basic Metabolic Panel Recent Labs    10/07/21 0039 10/08/21 0449 10/08/21 1528 10/08/21 1656 10/09/21 0235  NA 135 135 141   141  --  137  K 3.1* 4.2 3.9   3.9  --  4.4  CL 98 99  --   --  100  CO2 28 30  --   --  28  GLUCOSE 166* 117*  --   --  147*  BUN 7 8  --   --  13  CREATININE 0.52 0.45  --  0.51 0.54  CALCIUM 8.6* 9.0  --   --  8.9  MG 1.4* 2.2  --   --   --    Liver Function Tests Recent Labs    10/09/21 0235  AST 32  ALT 20  ALKPHOS 88  BILITOT 0.3  PROT 6.5  ALBUMIN 2.8*  No results for input(s): LIPASE, AMYLASE in the last 72 hours. Cardiac Enzymes No results for input(s): CKTOTAL, CKMB, CKMBINDEX, TROPONINI in the last 72 hours.  BNP: BNP (last 3 results) Recent Labs    10/06/21 1636  BNP 2,369.4*    ProBNP (last 3 results) No results for input(s): PROBNP in the last 8760 hours.   D-Dimer No results for input(s): DDIMER in the last 72 hours. Hemoglobin A1C No results for input(s): HGBA1C in the last 72 hours.  Fasting Lipid Panel No results for input(s): CHOL, HDL, LDLCALC, TRIG, CHOLHDL, LDLDIRECT in the last 72 hours.  Thyroid Function Tests Recent Labs    10/06/21 1739 10/07/21 0838  TSH <0.010*  --   T3FREE  --  15.8*    Other results:   Imaging    CARDIAC CATHETERIZATION  Result Date: 10/09/2021 1. No significant CAD.  Nonischemic cardiomyopathy. 2. Low filling pressures. 3. Preserved cardiac output.     Medications:     Scheduled  Medications:  aspirin EC  81 mg Oral Daily   carvedilol  6.25 mg Oral BID WC   docusate sodium  100 mg Oral BID   enoxaparin (LOVENOX) injection  40 mg Subcutaneous Q24H   isosorbide-hydrALAZINE  1 tablet Oral TID   methimazole  10 mg Oral TID   sacubitril-valsartan  1 tablet Oral BID   sodium chloride flush  3 mL Intravenous Q12H   sodium chloride flush  3 mL Intravenous Q12H   sodium chloride flush  3 mL Intravenous Q12H   spironolactone  25 mg Oral Daily    Infusions:  sodium chloride      PRN Medications: sodium chloride, acetaminophen, bisacodyl, morphine injection, ondansetron (ZOFRAN) IV, ondansetron **OR** [DISCONTINUED] ondansetron (ZOFRAN) IV, oxyCODONE, polyethylene glycol, sodium chloride flush, traZODone    Patient Profile   40 y.o. AAF with history of polysubstance abuse and HTN. No prior cardiac history. Now admitted with new systolic HF, hypertensive urgency, iron deficiency anemia.   Assessment/Plan   1. Acute systolic CHF: Nonischemic cardiomyopathy.  Echo this admission with EF 40-45% with inferior/inferoseptal HK, low normal RV function, mild-moderate MR, elevated AoV gradient at 11 mmHg but valve opens normally, dilated IVC, pleural effusion.  Patient abuses cocaine, also smokes and does drink some as well.  She has uncontrolled HTN.  No FH of CMP that she knows of.  Admitted with chest pressure but HS-TnI only mildly elevated with no trend, so doubt ACS.  Do not think new LBBB represents STEMI equivalent.  Cardiomyopathy could be due to longstanding HTN versus cocaine.  Also suspect hyperthyroidism may play a role in both cardiomyopathy and HTN.  Cath showed low filling pressures and no CAD.  She has diuresed well.  - Would use Lasix prn at this point, very low PCWP and RA pressure on RHC yesterday.  - Continue Entresto 49/51 bid.  - Continue spironolactone 25 mg daily.  - Continue Bidil.  - Increase Coreg to 6.25 mg bid  - Needs to avoid cocaine and ETOH.   2. HTN: Long-standing.  Suspect hyperthyroidism plays a role. Renal artery dopplers did not show FMD.  Improving.  - Agree with checking renin/aldosterone. - Treating hyperthyroidism.  3. Fe deficiency anemia: FOBT negative.  May be due to heavy menses. Has had feraheme. Hgb 8.4 today.  4. Cocaine abuse: Counseled cessation.  5. Smoking: Counseled cessation.  6. Hyperthyroidism: TSH markedly low, also with tachycardia/weight loss/cardiomyopathy.  ?Graves disease. Thyroid US c/w thyroiditis.  - Continue Coreg.  -  Started methimazole.   - Followup with endocrinology as outpatient.   Patient can go home from my standpoint.  She will need close followup in CHF clinic and with endocrinology.  Meds for home: Methimazole 10 tid, Coreg 6.25 mg bid, Entresto 49/51 bid, Bidil 1 tab tid or equivalent hydralazine/imdur, spironolactone 25 mg daily.  Can have Lasix 20 mg to take prn.   Loralie Champagne 10/09/2021 9:03 AM

## 2021-10-09 NOTE — TOC CM/SW Note (Signed)
HF TOC CM entered [place]. Will use HF funds for heart medications. Spoke to pt and she does work. Will need note for work. Medications will come up from [place]. Her husband will be at home to assist with care. Appt for [facility] on 10/30/2021 is in chart. [place], Heart Failure TOC CM [phone]

## 2021-10-09 NOTE — Telephone Encounter (Signed)
Tried calling patient, no answer,unable to leave message. Will try again later.  Was calling to schedule post hosp fu with [facility]

## 2021-10-14 LAB — ALDOSTERONE + RENIN ACTIVITY W/ RATIO
ALDO / PRA Ratio: UNDETERMINED
Aldosterone: <1 ng/dL (ref 0.0–30.0)
PRA LC/MS/MS: <0.167 ng/mL/hr — ABNORMAL LOW (ref 0.167–5.380)

## 2021-10-29 ENCOUNTER — Other Ambulatory Visit: Payer: Self-pay | Admitting: *Deleted

## 2021-10-30 ENCOUNTER — Ambulatory Visit: Payer: Self-pay | Attending: Family Medicine | Admitting: Family Medicine

## 2021-10-30 ENCOUNTER — Other Ambulatory Visit: Payer: Self-pay

## 2021-10-30 ENCOUNTER — Encounter: Payer: Self-pay | Admitting: Family Medicine

## 2021-10-30 VITALS — BP 110/60 | HR 99 | Temp 99.2°F | Resp 16 | Ht 68.0 in | Wt 125.0 lb

## 2021-10-30 DIAGNOSIS — E039 Hypothyroidism, unspecified: Secondary | ICD-10-CM | POA: Insufficient documentation

## 2021-10-30 DIAGNOSIS — I428 Other cardiomyopathies: Secondary | ICD-10-CM | POA: Insufficient documentation

## 2021-10-30 DIAGNOSIS — Z79899 Other long term (current) drug therapy: Secondary | ICD-10-CM | POA: Insufficient documentation

## 2021-10-30 DIAGNOSIS — Z7901 Long term (current) use of anticoagulants: Secondary | ICD-10-CM | POA: Insufficient documentation

## 2021-10-30 DIAGNOSIS — J90 Pleural effusion, not elsewhere classified: Secondary | ICD-10-CM | POA: Insufficient documentation

## 2021-10-30 DIAGNOSIS — I509 Heart failure, unspecified: Secondary | ICD-10-CM | POA: Insufficient documentation

## 2021-10-30 DIAGNOSIS — E0590 Thyrotoxicosis, unspecified without thyrotoxic crisis or storm: Secondary | ICD-10-CM | POA: Insufficient documentation

## 2021-10-30 DIAGNOSIS — R0789 Other chest pain: Secondary | ICD-10-CM

## 2021-10-30 MED ORDER — SPIRONOLACTONE 25 MG PO TABS: 25.0000 mg | ORAL_TABLET | Freq: Every day | ORAL | 1 refills | Status: DC

## 2021-10-30 MED ORDER — CARVEDILOL 6.25 MG PO TABS: 6.2500 mg | ORAL_TABLET | Freq: Two times a day (BID) | ORAL | 1 refills | Status: DC

## 2021-10-30 MED ORDER — METHIMAZOLE 10 MG PO TABS: 10.0000 mg | ORAL_TABLET | Freq: Three times a day (TID) | ORAL | 1 refills | Status: DC

## 2021-10-30 NOTE — Progress Notes (Signed)
Subjective:  Patient ID: Judith Lowe, female    DOB: 07-Dec-1980  Age: 41 y.o. MRN: [id number]  CC: Congestive Heart Failure and Hospitalization Follow-up   HPI Judith Lowe is a 41 y.o. year old female with a history of polysubstance abuse, NICM (EF 40-45% from echo of 11/2020) Who presents today to establish care. He was hospitalized from 10/06/2021 through 10/09/2021 for acute CHF exacerbation after she presented with dyspnea.  Urine drug screen returned positive for cocaine and THC. Labs reveal hypothyroidism, cardiac cath revealed no significant CAD. Echocardiogram revealed: IMPRESSIONS     1. LVEF is depressed with hypokinesis in the inferoseptal, inferior walls  . Left ventricular ejection fraction, by estimation, is 40 to 45%. The  left ventricle has mildly decreased function. The left ventricular  internal cavity size was mildly dilated.  There is mild left ventricular hypertrophy. Indeterminate diastolic  filling due to E-A fusion.   2. Right ventricular systolic function is low normal. The right  ventricular size is normal.   3. Left atrial size was moderately dilated.   4. Large pleural effusion.   5. The mitral valve is normal in structure. Mild to moderate mitral valve  regurgitation.   6. AV is thickened with minimally restricted motion. Mean gradient  through the valve is 11 mm Hg. Marland Kitchen Aortic valve regurgitation is not  visualized. Aortic valve sclerosis/calcification is present, without any  evidence of aortic stenosis.   7. The inferior vena cava is dilated in size with <50% respiratory  variability, suggesting right atrial pressure of 15 mmHg.   Interval History: She feels weak all the time. 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 Exertion causes a pinch in her L chestwall and then it resolves and she also gets dyspneic and she has to stop when she has to bend to pick boxes and stand up  repetitively.  She also has chest pain when doing that.  At the moment she complains of left-sided chest pain which she said also happens when she gets excited. She has no pedal edema or weight gain. She has no appointment with Cardiology Endorses compliance with her medications. Past Medical History:  Diagnosis Date   Drug use    ETOH abuse     Past Surgical History:  Procedure Laterality Date   NO PAST SURGERIES     RIGHT/LEFT HEART CATH AND CORONARY ANGIOGRAPHY N/A 10/08/2021   Procedure: RIGHT/LEFT HEART CATH AND CORONARY ANGIOGRAPHY;  Surgeon: Larey Dresser, MD;  Location: [facility];  Service: Cardiovascular;  Laterality: N/A;    Family History  Problem Relation Age of Onset   Hypertension Mother     No Known Allergies  Outpatient Medications Prior to Visit  Medication Sig Dispense Refill   hydrALAZINE (APRESOLINE) 25 MG tablet Take 1 tablet (25 mg total) by mouth 3 (three) times daily. 120 tablet 11   isosorbide mononitrate (IMDUR) 30 MG 24 hr tablet Take 1 tablet (30 mg total) by mouth daily. 30 tablet 11   sacubitril-valsartan (ENTRESTO) 49-51 MG Take 1 tablet by mouth 2 (two) times daily. 60 tablet 0   carvedilol (COREG) 6.25 MG tablet Take 1 tablet (6.25 mg total) by mouth 2 (two) times daily with a meal. 60 tablet 0   methimazole (TAPAZOLE) 10 MG tablet Take 1 tablet (10 mg total) by mouth 3 (three) times daily. 90 tablet 0   spironolactone (ALDACTONE) 25  MG tablet Take 1 tablet (25 mg total) by mouth daily. 30 tablet 0   No facility-administered medications prior to visit.     ROS Review of Systems  Constitutional:  Positive for fatigue. Negative for activity change and appetite change.  HENT:  Negative for congestion, sinus pressure and sore throat.   Eyes:  Negative for visual disturbance.  Respiratory:  Positive for shortness of breath. Negative for cough, chest tightness and wheezing.   Cardiovascular:  Positive for chest pain. Negative for  palpitations.  Gastrointestinal:  Negative for abdominal distention, abdominal pain and constipation.  Endocrine: Negative for polydipsia.  Genitourinary:  Negative for dysuria and frequency.  Musculoskeletal:  Negative for arthralgias and back pain.  Skin:  Negative for rash.  Neurological:  Negative for tremors, light-headedness and numbness.  Hematological:  Does not bruise/bleed easily.  Psychiatric/Behavioral:  Negative for agitation and behavioral problems.    Objective:  BP 110/60 (BP Location: Left Arm, Patient Position: Sitting, Cuff Size: Normal)    Pulse 99    Temp 99.2 F (37.3 C) (Oral)    Resp 16    Ht 5\' 8"  (1.727 m)    Wt 125 lb (56.7 kg)    SpO2 98%    BMI 19.01 kg/m   BP/Weight 10/30/2021 10/09/2021 2/58/5277  Systolic BP 824 235 361  Diastolic BP 60 82 95  Wt. (Lbs) 125 113.6 131  BMI 19.01 17.27 19.35      Physical Exam Constitutional:      Appearance: She is well-developed.  Cardiovascular:     Rate and Rhythm: Normal rate.     Heart sounds: Normal heart sounds. No murmur heard. Pulmonary:     Effort: Pulmonary effort is normal.     Breath sounds: Normal breath sounds. No wheezing or rales.  Chest:     Chest wall: No tenderness.  Abdominal:     General: Bowel sounds are normal. There is no distension.     Palpations: Abdomen is soft. There is no mass.     Tenderness: There is no abdominal tenderness.  Musculoskeletal:        General: Normal range of motion.     Right lower leg: No edema.     Left lower leg: No edema.  Neurological:     Mental Status: She is alert and oriented to person, place, and time.  Psychiatric:        Mood and Affect: Mood normal.    CMP Latest Ref Rng & Units 10/09/2021 10/08/2021 10/08/2021  Glucose 70 - 99 mg/dL 147(H) - -  BUN 6 - 20 mg/dL 13 - -  Creatinine 0.44 - 1.00 mg/dL 0.54 0.51 -  Sodium 135 - 145 mmol/L 137 - 141  Potassium 3.5 - 5.1 mmol/L 4.4 - 3.9  Chloride 98 - 111 mmol/L 100 - -  CO2 22 - 32 mmol/L 28  - -  Calcium 8.9 - 10.3 mg/dL 8.9 - -  Total Protein 6.5 - 8.1 g/dL 6.5 - -  Total Bilirubin 0.3 - 1.2 mg/dL 0.3 - -  Alkaline Phos 38 - 126 U/L 88 - -  AST 15 - 41 U/L 32 - -  ALT 0 - 44 U/L 20 - -    Lipid Panel     Component Value Date/Time   CHOL 94 10/06/2021 0415   TRIG 54 10/06/2021 0415   HDL 31 (L) 10/06/2021 0415   CHOLHDL 3.0 10/06/2021 0415   VLDL 11 10/06/2021 0415   LDLCALC 52 10/06/2021  0415    CBC    Component Value Date/Time   WBC 6.3 10/09/2021 0235   RBC 4.47 10/09/2021 0235   HGB 8.4 (L) 10/09/2021 0235   HCT 30.2 (L) 10/09/2021 0235   PLT 296 10/09/2021 0235   MCV 67.6 (L) 10/09/2021 0235   MCH 18.8 (L) 10/09/2021 0235   MCHC 27.8 (L) 10/09/2021 0235   RDW 22.5 (H) 10/09/2021 0235   LYMPHSABS 1.6 10/06/2021 0415   MONOABS 0.7 10/06/2021 0415   EOSABS 0.1 10/06/2021 0415   BASOSABS 0.0 10/06/2021 0415    Lab Results  Component Value Date   HGBA1C 5.7 (H) 10/06/2021    Lab Results  Component Value Date   TSH <0.010 (L) 10/06/2021    Assessment & Plan:  1. Nonischemic cardiomyopathy (HCC) EF of 40 to 45%, NYHA III Euvolemic She is currently on light duty at work Continue guideline directed medical therapy Referred to cardiology for optimization of management - Ambulatory referral to Cardiology - Basic Metabolic Panel - carvedilol (COREG) 6.25 MG tablet; Take 1 tablet (6.25 mg total) by mouth 2 (two) times daily with a meal.  Dispense: 60 tablet; Refill: 1 - spironolactone (ALDACTONE) 25 MG tablet; Take 1 tablet (25 mg total) by mouth daily.  Dispense: 30 tablet; Refill: 1  2. Hyperthyroidism Suppressed TSH Will check repeat thyroid panel at next visit in 1 month - methimazole (TAPAZOLE) 10 MG tablet; Take 1 tablet (10 mg total) by mouth 3 (three) times daily.  Dispense: 90 tablet; Refill: 1  3. Other chest pain EKG today reveals normal sinus rhythm with no ischemic changes Most recent cardiac cath negative for CAD    Meds  ordered this encounter  Medications   carvedilol (COREG) 6.25 MG tablet    Sig: Take 1 tablet (6.25 mg total) by mouth 2 (two) times daily with a meal.    Dispense:  60 tablet    Refill:  1   methimazole (TAPAZOLE) 10 MG tablet    Sig: Take 1 tablet (10 mg total) by mouth 3 (three) times daily.    Dispense:  90 tablet    Refill:  1   spironolactone (ALDACTONE) 25 MG tablet    Sig: Take 1 tablet (25 mg total) by mouth daily.    Dispense:  30 tablet    Refill:  1    Follow-up: Return in about 1 month (around 11/30/2021) for Complete physical exam.       Charlott Rakes, MD, FAAFP. [facility] [place], [place]   10/30/2021, 2:31 PM

## 2021-10-30 NOTE — Patient Instructions (Signed)
Heart Failure, Diagnosis °Heart failure means that your heart is not able to pump blood in the right way. This makes it hard for your body to work well. Heart failure is usually a long-term (chronic) condition. You must take good care of yourself and follow your treatment plan from your doctor. °Different stages of heart failure have different treatment plans. The stages are: °Stage A: At risk for heart failure. °Stage B: Pre-heart failure. °Stage C: Symptomatic heart failure. °Stage D: Advanced heart failure. °What are the causes? °High blood pressure. °Buildup of cholesterol and fat in the arteries. °Heart attack. This injures the heart muscle. °Heart valves that do not open and close properly. °Damage of the heart muscle. This is also called cardiomyopathy. °Infection of the heart muscle. This is also called myocarditis. °Lung disease. °What increases the risk? °Getting older. The risk of heart failure goes up as a person ages. °Being overweight. °Using tobacco or nicotine products. °Abusing alcohol or drugs. °Having taken medicines that can damage the heart. °Having any of these conditions: °Diabetes. °Abnormal heart rhythms. °Thyroid problems. °Low blood counts (anemia). °Having a family history of heart failure. °What are the signs or symptoms? °Shortness of breath. °Coughing. °Swelling of the feet, ankles, legs, or belly. °Losing or gaining weight for no reason. °Trouble breathing. °Waking from sleep because of the need to sit up and get more air. °Fast heartbeat. °Other symptoms may include: °Being very tired. °Feeling dizzy, or feeling like you may pass out (faint). °Having no desire to eat. °Feeling like you may vomit (nauseous). °Peeing (urinating) more at night. °Feeling confused. °How is this treated? °This condition may be treated with: °Medicines. These can be given to treat blood pressure and to make the heart muscles stronger. °Changes in your daily life. These may include: °Eating a healthy  diet. °Staying at a healthy body weight. °Quitting tobacco, alcohol, and drug use. °Doing exercises. °Participating in a cardiac rehabilitation program. This program helps you improve your health through exercise, education, and counseling. °Surgery. Surgery can be done to open blocked valves or to put devices in the heart, such as pacemakers. °A donor heart (heart transplant). You will receive a healthy heart from a donor. °Follow these instructions at home: °Treat other conditions as told by your doctor. These may include high blood pressure, diabetes, thyroid disease, or abnormal heart rhythms. °Learn as much as you can about heart failure. °Get support as you need it. °Keep all follow-up visits. °Where to find more information °American Heart Association: www.heart.org °Centers for Disease Control and Prevention: www.cdc.gov °National Institute on Aging: www.nia.nih.gov °Summary °Heart failure means that your heart is not able to pump blood in the right way. °This condition is often caused by high blood pressure, heart attack, or damage of the heart muscle. °Symptoms of this condition include shortness of breath and swelling of the feet, ankles, legs, or belly. You may also feel very tired or feel like you may vomit. °You may be treated with medicines, surgery, or changes in your daily life. °Treat other health conditions as told by your doctor. °This information is not intended to replace advice given to you by your health care provider. Make sure you discuss any questions you have with your health care provider. °Document Revised: 04/10/2021 Document Reviewed: 05/04/2020 °Elsevier Patient Education © 2022 [employer] Inc. ° °

## 2021-10-30 NOTE — Progress Notes (Signed)
Hospital follow up: CHF Unable to weigh daily, no scale Denies cough  Discuss work/ job task

## 2021-10-31 LAB — BASIC METABOLIC PANEL
BUN/Creatinine Ratio: 24 — ABNORMAL HIGH (ref 9–23)
BUN: 15 mg/dL (ref 6–24)
CO2: 26 mmol/L (ref 20–29)
Calcium: 10.3 mg/dL — ABNORMAL HIGH (ref 8.7–10.2)
Chloride: 101 mmol/L (ref 96–106)
Creatinine, Ser: 0.62 mg/dL (ref 0.57–1.00)
Glucose: 108 mg/dL — ABNORMAL HIGH (ref 70–99)
Potassium: 4.5 mmol/L (ref 3.5–5.2)
Sodium: 139 mmol/L (ref 134–144)
eGFR: 114 mL/min/{1.73_m2} (ref >59)

## 2021-11-05 ENCOUNTER — Telehealth: Payer: Self-pay

## 2021-11-05 NOTE — Telephone Encounter (Signed)
-----   Message from Charlott Rakes, MD sent at 10/31/2021 11:09 AM EST ----- Please inform the patient that labs are stable.

## 2021-11-05 NOTE — Telephone Encounter (Signed)
Patient name and DOB has been verified Patient was informed of lab results. Patient had no questions.  

## 2021-11-11 ENCOUNTER — Encounter: Payer: Self-pay | Admitting: Cardiology

## 2021-11-11 ENCOUNTER — Other Ambulatory Visit: Payer: Self-pay

## 2021-11-11 NOTE — Progress Notes (Signed)
This encounter was created in error - please disregard.

## 2021-11-17 ENCOUNTER — Other Ambulatory Visit (HOSPITAL_COMMUNITY): Payer: Self-pay

## 2021-11-18 ENCOUNTER — Other Ambulatory Visit (HOSPITAL_COMMUNITY): Payer: Self-pay

## 2021-11-21 ENCOUNTER — Telehealth (HOSPITAL_COMMUNITY): Payer: Self-pay

## 2021-11-21 ENCOUNTER — Other Ambulatory Visit: Payer: Self-pay

## 2021-11-21 ENCOUNTER — Other Ambulatory Visit (HOSPITAL_COMMUNITY): Payer: Self-pay

## 2021-11-21 ENCOUNTER — Encounter (HOSPITAL_COMMUNITY): Payer: Self-pay

## 2021-11-21 ENCOUNTER — Ambulatory Visit (HOSPITAL_COMMUNITY)
Admission: RE | Admit: 2021-11-21 | Discharge: 2021-11-21 | Disposition: A | Discharge status: Home / Self Care | Payer: Self-pay | Source: Ambulatory Visit | Attending: Family Medicine | Admitting: Family Medicine

## 2021-11-21 VITALS — BP 135/80 | HR 79 | Wt 137.6 lb

## 2021-11-21 DIAGNOSIS — D509 Iron deficiency anemia, unspecified: Secondary | ICD-10-CM

## 2021-11-21 DIAGNOSIS — F1090 Alcohol use, unspecified, uncomplicated: Secondary | ICD-10-CM | POA: Insufficient documentation

## 2021-11-21 DIAGNOSIS — E0590 Thyrotoxicosis, unspecified without thyrotoxic crisis or storm: Secondary | ICD-10-CM

## 2021-11-21 DIAGNOSIS — I10 Essential (primary) hypertension: Secondary | ICD-10-CM

## 2021-11-21 DIAGNOSIS — I5022 Chronic systolic (congestive) heart failure: Secondary | ICD-10-CM

## 2021-11-21 DIAGNOSIS — I509 Heart failure, unspecified: Secondary | ICD-10-CM

## 2021-11-21 DIAGNOSIS — I340 Nonrheumatic mitral (valve) insufficiency: Secondary | ICD-10-CM | POA: Insufficient documentation

## 2021-11-21 DIAGNOSIS — F1910 Other psychoactive substance abuse, uncomplicated: Secondary | ICD-10-CM

## 2021-11-21 DIAGNOSIS — Z87891 Personal history of nicotine dependence: Secondary | ICD-10-CM | POA: Insufficient documentation

## 2021-11-21 DIAGNOSIS — I447 Left bundle-branch block, unspecified: Secondary | ICD-10-CM | POA: Insufficient documentation

## 2021-11-21 DIAGNOSIS — I3139 Other pericardial effusion (noninflammatory): Secondary | ICD-10-CM | POA: Insufficient documentation

## 2021-11-21 DIAGNOSIS — I110 Hypertensive heart disease with heart failure: Secondary | ICD-10-CM | POA: Insufficient documentation

## 2021-11-21 DIAGNOSIS — Z79899 Other long term (current) drug therapy: Secondary | ICD-10-CM | POA: Insufficient documentation

## 2021-11-21 DIAGNOSIS — Z79890 Hormone replacement therapy (postmenopausal): Secondary | ICD-10-CM | POA: Insufficient documentation

## 2021-11-21 DIAGNOSIS — J90 Pleural effusion, not elsewhere classified: Secondary | ICD-10-CM | POA: Insufficient documentation

## 2021-11-21 DIAGNOSIS — F1410 Cocaine abuse, uncomplicated: Secondary | ICD-10-CM | POA: Insufficient documentation

## 2021-11-21 DIAGNOSIS — I428 Other cardiomyopathies: Secondary | ICD-10-CM

## 2021-11-21 LAB — CBC
HCT: 34.9 % — ABNORMAL LOW (ref 36.0–46.0)
Hemoglobin: 10.9 g/dL — ABNORMAL LOW (ref 12.0–15.0)
MCH: 23.6 pg — ABNORMAL LOW (ref 26.0–34.0)
MCHC: 31.2 g/dL (ref 30.0–36.0)
MCV: 75.7 fL — ABNORMAL LOW (ref 80.0–100.0)
Platelets: 310 10*3/uL (ref 150–400)
RBC: 4.61 MIL/uL (ref 3.87–5.11)
RDW: 30 % — ABNORMAL HIGH (ref 11.5–15.5)
WBC: 7.8 10*3/uL (ref 4.0–10.5)
nRBC: 0 % (ref 0.0–0.2)

## 2021-11-21 LAB — IRON AND TIBC
Iron: 44 ug/dL (ref 28–170)
Saturation Ratios: 11 % (ref 10.4–31.8)
TIBC: 396 ug/dL (ref 250–450)
UIBC: 352 ug/dL

## 2021-11-21 LAB — BASIC METABOLIC PANEL
Anion gap: 6 (ref 5–15)
BUN: 14 mg/dL (ref 6–20)
CO2: 27 mmol/L (ref 22–32)
Calcium: 8.9 mg/dL (ref 8.9–10.3)
Chloride: 99 mmol/L (ref 98–111)
Creatinine, Ser: 0.83 mg/dL (ref 0.44–1.00)
GFR, Estimated: >60 mL/min (ref >60)
Glucose, Bld: 114 mg/dL — ABNORMAL HIGH (ref 70–99)
Potassium: 3.1 mmol/L — ABNORMAL LOW (ref 3.5–5.1)
Sodium: 132 mmol/L — ABNORMAL LOW (ref 135–145)

## 2021-11-21 LAB — BRAIN NATRIURETIC PEPTIDE: B Natriuretic Peptide: 288.3 pg/mL — ABNORMAL HIGH (ref 0.0–100.0)

## 2021-11-21 LAB — FERRITIN: Ferritin: 8 ng/mL — ABNORMAL LOW (ref 11–307)

## 2021-11-21 MED ORDER — DAPAGLIFLOZIN PROPANEDIOL 10 MG PO TABS: 10.0000 mg | ORAL_TABLET | Freq: Every day | ORAL | 11 refills | Status: DC

## 2021-11-21 MED ORDER — METHIMAZOLE 10 MG PO TABS: 10.0000 mg | ORAL_TABLET | Freq: Three times a day (TID) | ORAL | 3 refills | Status: DC

## 2021-11-21 MED ORDER — HYDRALAZINE HCL 25 MG PO TABS: 25.0000 mg | ORAL_TABLET | Freq: Three times a day (TID) | ORAL | 8 refills | Status: DC

## 2021-11-21 MED ORDER — CARVEDILOL 6.25 MG PO TABS: 6.2500 mg | ORAL_TABLET | Freq: Two times a day (BID) | ORAL | 11 refills | Status: DC

## 2021-11-21 MED ORDER — POTASSIUM CHLORIDE CRYS ER 20 MEQ PO TBCR: 20.0000 meq | EXTENDED_RELEASE_TABLET | Freq: Two times a day (BID) | ORAL | 11 refills | Status: DC

## 2021-11-21 MED ORDER — ISOSORBIDE MONONITRATE ER 30 MG PO TB24: 30.0000 mg | ORAL_TABLET | Freq: Every day | ORAL | 11 refills | Status: DC

## 2021-11-21 MED ORDER — ENTRESTO 49-51 MG PO TABS: 1.0000 | ORAL_TABLET | Freq: Two times a day (BID) | ORAL | 11 refills | Status: DC

## 2021-11-21 MED ORDER — SPIRONOLACTONE 25 MG PO TABS: 25.0000 mg | ORAL_TABLET | Freq: Every day | ORAL | 11 refills | Status: DC

## 2021-11-21 NOTE — Telephone Encounter (Addendum)
Pt aware, agreeable, and verbalized understanding  Script sent to pharmacy for KCL  ----- Message from Rafael Bihari, FNP sent at 11/21/2021 10:57 AM EST ----- K is very low. Please take 40 mEq bid today and tomorrow, then 40 mEq daily thereafter. Will need BMET in 1 week.

## 2021-11-21 NOTE — Patient Instructions (Addendum)
Medication Changes:  Restart Entresto 49/51  Start Faxiga 10mg  Daily  Lab Work:  Labs done today, your results will be available in MyChart, we will contact you for abnormal readings.   Testing/Procedures:  Your physician has requested that you have an echocardiogram. Echocardiography is a painless test that uses sound waves to create images of your heart. It provides your doctor with information about the size and shape of your heart and how well your hearts chambers and valves are working. This procedure takes approximately one hour. There are no restrictions for this procedure.   Referrals:  none  Special Instructions // Education:  Do the following things EVERYDAY: Weigh yourself in the morning before breakfast. Write it down and keep it in a log. Take your medicines as prescribed Eat low salt foods--Limit salt (sodium) to 2000 mg per day.  Stay as active as you can everyday Limit all fluids for the day to less than 2 liters   Follow-Up in: 4 wks with clinic and 12 weeks with Dr. Aundra Dubin  At the [facility], you and your health needs are our priority. We have a designated team specialized in the treatment of Heart Failure. This Care Team includes your primary Heart Failure Specialized Cardiologist (physician), Advanced Practice Providers (APPs- Physician Assistants and Nurse Practitioners), and Pharmacist who all work together to provide you with the care you need, when you need it.   You may see any of the following providers on your designated Care Team at your next follow up:  Dr Glori Bickers Dr Haynes Kerns, NP Lyda Jester, [place] [facility] [place], [place] Audry Riles, PharmD   Please be sure to bring in all your medications bottles to every appointment.   Need to Contact us:  If you have any questions or concerns before your next appointment please send [place] a message through [place] or call our office at  [phone].    TO LEAVE A MESSAGE FOR THE NURSE SELECT OPTION 2, PLEASE LEAVE A MESSAGE INCLUDING: YOUR NAME DATE OF BIRTH CALL BACK NUMBER REASON FOR CALL**this is important as we prioritize the call backs  YOU WILL RECEIVE A CALL BACK THE SAME DAY AS LONG AS YOU CALL BEFORE 4:00 PM

## 2021-11-21 NOTE — Telephone Encounter (Signed)
-----   Message from Rafael Bihari, [place] sent at 11/21/2021 10:57 AM EST ----- K is very low. Please take 40 mEq bid today and tomorrow, then 40 mEq daily thereafter. Will need BMET in 1 week.

## 2021-11-21 NOTE — Progress Notes (Signed)
ADVANCED HF CLINIC CONSULT NOTE   Primary Care: Charlott Rakes, MD HF Cardiologist: Dr. Aundra Dubin  HPI: Judith Lowe is a 41 y.o. female with no prior routine medical care, ETOH and substance abuse (cocaine), tobacco user and new diagnosis of HTN, IDA, hyperthyroidism, and systolic heart failure.  She was admitted 1/23 with new acute heart failure, hypoxia requiring BiPap and hypertensive urgency. She was diuresed with IV Lasix and started on nitro gtt. Echo showed EF 40%, RV normal, mild MR and mild pericardial effusion. UDS + cocaine/THC. ECG with new LBBB. Underwent R/LHC showing no CAD and low filling pressures. She was started on GDMT. TSH low and started on methimazole. Discharge weight 113 lbs.  Today she returns for post hospital HF follow up with her daughter. Overall feeling fine. She remains fatigued and SOB with housework, walking up stairs, but no significant dyspnea walking on flat ground. Denies abnormal bleeding, CP, dizziness, edema, or palpitations. Chronically sleeps on 3 pillows. Appetite ok. No fever or chills. She does not check weight at home. She has not had her Entresto in 2 days. Quit smoking/ETOH/cocaine since hospitalization.   Review of Systems: [y] = yes, [ ]  = no   General: Weight gain [ ] ; Weight loss [ ] ; Anorexia [ ] ; Fatigue [ y]; Fever [ ] ; Chills [ ] ; Weakness [ ]   Cardiac: Chest pain/pressure [ ] ; Resting SOB [ ] ; Exertional SOB Blue.Reese ]; Orthopnea [ ] ; Pedal Edema [ ] ; Palpitations [ ] ; Syncope [ ] ; Presyncope [ ] ; Paroxysmal nocturnal dyspnea[ ]   Pulmonary: Cough [ ] ; Wheezing[ ] ; Hemoptysis[ ] ; Sputum [ ] ; Snoring [ ]   GI: Vomiting[ ] ; Dysphagia[ ] ; Melena[ ] ; Hematochezia [ ] ; Heartburn[ ] ; Abdominal pain [ ] ; Constipation [ ] ; Diarrhea [ ] ; BRBPR [ ]   GU: Hematuria[ ] ; Dysuria [ ] ; Nocturia[ ]   Vascular: Pain in legs with walking [ ] ; Pain in feet with lying flat [ ] ; Non-healing sores [ ] ; Stroke [ ] ; TIA [ ] ; Slurred speech [ ] ;  Neuro: Headaches[ ] ;  Vertigo[ ] ; Seizures[ ] ; Paresthesias[ ] ;Blurred vision [ ] ; Diplopia [ ] ; Vision changes [ ]   Ortho/Skin: Arthritis [ ] ; Joint pain [ ] ; Muscle pain [ ] ; Joint swelling [ ] ; Back Pain [ ] ; Rash [ ]   Psych: Depression[ ] ; Anxiety[ ]   Heme: Bleeding problems [ ] ; Clotting disorders [ ] ; Anemia Blue.Reese ]  Endocrine: Diabetes [ ] ; Thyroid dysfunction[y ]   Past Medical History:  Diagnosis Date   Drug use    ETOH abuse     Current Outpatient Medications  Medication Sig Dispense Refill   carvedilol (COREG) 6.25 MG tablet Take 1 tablet (6.25 mg total) by mouth 2 (two) times daily with a meal. 60 tablet 1   hydrALAZINE (APRESOLINE) 25 MG tablet Take 1 tablet (25 mg total) by mouth 3 (three) times daily. 120 tablet 11   isosorbide mononitrate (IMDUR) 30 MG 24 hr tablet Take 1 tablet (30 mg total) by mouth daily. 30 tablet 11   methimazole (TAPAZOLE) 10 MG tablet Take 1 tablet (10 mg total) by mouth 3 (three) times daily. 90 tablet 1   spironolactone (ALDACTONE) 25 MG tablet Take 1 tablet (25 mg total) by mouth daily. 30 tablet 1   No current facility-administered medications for this encounter.   No Known Allergies  Social History   Socioeconomic History   Marital status: Single    Spouse name: Not on file   Number of children: Not on file  Years of education: Not on file   Highest education level: Not on file  Occupational History   Occupation: unemployed  Tobacco Use   Smoking status: Every Day    Packs/day: 0.25    Years: 5.00    Pack years: 1.25    Types: Cigarettes   Smokeless tobacco: Never  Substance and Sexual Activity   Alcohol use: Yes    Alcohol/week: 1.0 standard drink    Types: 1 Glasses of wine per week    Comment: occaisionally; once every two weeks    Drug use: Yes    Types: Marijuana, Cocaine    Comment: pt reports smoking everyday   Sexual activity: Yes    Birth control/protection: None  Other Topics Concern   Not on file  Social History Narrative   Not on  file   Social Determinants of Health   Financial Resource Strain: Not on file  Food Insecurity: Not on file  Transportation Needs: Not on file  Physical Activity: Not on file  Stress: Not on file  Social Connections: Not on file  Intimate Partner Violence: Not on file   Family History  Problem Relation Age of Onset   Hypertension Mother    BP 135/80    Pulse 79    Wt 62.4 kg    SpO2 98%    BMI 20.92 kg/m   Wt Readings from Last 3 Encounters:  11/21/21 62.4 kg  11/11/21 59.7 kg  10/30/21 56.7 kg   PHYSICAL EXAM: General:  NAD. No resp difficulty HEENT: Normal Neck: Supple. No JVD. Carotids 2+ bilat; no bruits. No lymphadenopathy, + thyromegaly  Cor: PMI nondisplaced. Regular rate & rhythm. No rubs, gallops or murmurs. Lungs: Clear Abdomen: Soft, nontender, nondistended. No hepatosplenomegaly. No bruits or masses. Good bowel sounds. Extremities: No cyanosis, clubbing, rash, edema Neuro: Alert & oriented x 3, cranial nerves grossly intact. Moves all 4 extremities w/o difficulty. Affect pleasant.  ECG: NSR 75 bpm (personally reviewed).  ASSESSMENT & PLAN: 1. Chronic systolic CHF: Nonischemic cardiomyopathy.  Echo 1/23 EF 40-45% with inferior/inferoseptal HK, low normal RV function, mild-moderate MR, elevated AoV gradient at 11 mmHg but valve opens normally, dilated IVC, pleural effusion.  Patient abuses cocaine, also smokes and does drink some as well.  She has uncontrolled HTN.  No FH of CMP that she knows of.  Admitted with chest pressure but HS-TnI only mildly elevated with no trend, so doubt ACS.  Do not think new LBBB represents STEMI equivalent.  Cardiomyopathy could be due to longstanding HTN versus cocaine.  Also suspect hyperthyroidism may play a role in both cardiomyopathy and HTN.  Cath showed low filling pressures and no CAD.  NYHA II- early III today, she does not appear volume overloaded on exam, ReDs 35%. She does not deep daily loop. - Start Farxiga 10 mg daily. -  Restart Entresto 49/51 bid. BMET/BNP today. - Continue spironolactone 25 mg daily.  - Continue Bidil.  - Continue Coreg 6.25 mg bid.  - Needs to avoid cocaine and ETOH.  2. HTN: Long-standing.  Suspect hyperthyroidism plays a role. Renal artery dopplers did not show FMD.  BP better controlled today. Restart Entresto as above. - Renin/aldosterone unrevealing. - Treating hyperthyroidism.  3. Fe deficiency anemia: FOBT negative.  May be due to heavy menses. Has had feraheme.  - Check CBC, Iron/TIBC and ferratin today. 4. Cocaine abuse: Counseled cessation.  5. Smoking: Congratulated on cessation.  6. Hyperthyroidism: TSH markedly low, during recent admit also with tachycardia/weight loss/cardiomyopathy.  ?  Graves disease. Thyroid US c/w thyroiditis.  - Continue Coreg.  - Continue methimazole. - PCP managing.  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 She needs allowances for rest breaks/days off if needed.   Follow up with APP in 4 weeks (increase Entresto +/- Coreg) and in 12 weeks with Dr. Aundra Dubin + echo.  Allena Katz, FNP-BC 11/21/21

## 2021-11-21 NOTE — Progress Notes (Signed)
° °   ReDS Vest / Clip - 11/21/21 0948       ReDS Vest / Clip   Station Marker C    Ruler Value 27    ReDS Value Range Low volume    ReDS Actual Value 35

## 2021-11-28 ENCOUNTER — Other Ambulatory Visit (HOSPITAL_COMMUNITY): Payer: Self-pay

## 2021-12-16 ENCOUNTER — Ambulatory Visit: Payer: Self-pay | Admitting: Family Medicine

## 2021-12-17 NOTE — Progress Notes (Incomplete)
ADVANCED HF CLINIC CONSULT NOTE   Primary Care: Charlott Rakes, MD HF Cardiologist: Dr. Aundra Dubin  HPI: Judith Lowe is a 41 y.o. female with no prior routine medical care, ETOH and substance abuse (cocaine), tobacco user and new diagnosis of HTN, IDA, hyperthyroidism, and systolic heart failure.  She was admitted 1/23 with new acute heart failure, hypoxia requiring BiPap and hypertensive urgency. She was diuresed with IV Lasix and started on nitro gtt. Echo showed EF 40%, RV normal, mild MR and mild pericardial effusion. UDS + cocaine/THC. ECG with new LBBB. Underwent R/LHC showing no CAD and low filling pressures. She was started on GDMT. TSH low and started on methimazole. Discharge weight 113 lbs.  Today she returns for post hospital HF follow up with her daughter. Overall feeling fine. She remains fatigued and SOB with housework, walking up stairs, but no significant dyspnea walking on flat ground. Denies abnormal bleeding, CP, dizziness, edema, or palpitations. Chronically sleeps on 3 pillows. Appetite ok. No fever or chills. She does not check weight at home. She has not had her Entresto in 2 days. Quit smoking/ETOH/cocaine since hospitalization.   Review of Systems: [y] = yes, [ ]  = no   General: Weight gain [ ] ; Weight loss [ ] ; Anorexia [ ] ; Fatigue [ y]; Fever [ ] ; Chills [ ] ; Weakness [ ]   Cardiac: Chest pain/pressure [ ] ; Resting SOB [ ] ; Exertional SOB Blue.Reese ]; Orthopnea [ ] ; Pedal Edema [ ] ; Palpitations [ ] ; Syncope [ ] ; Presyncope [ ] ; Paroxysmal nocturnal dyspnea[ ]   Pulmonary: Cough [ ] ; Wheezing[ ] ; Hemoptysis[ ] ; Sputum [ ] ; Snoring [ ]   GI: Vomiting[ ] ; Dysphagia[ ] ; Melena[ ] ; Hematochezia [ ] ; Heartburn[ ] ; Abdominal pain [ ] ; Constipation [ ] ; Diarrhea [ ] ; BRBPR [ ]   GU: Hematuria[ ] ; Dysuria [ ] ; Nocturia[ ]   Vascular: Pain in legs with walking [ ] ; Pain in feet with lying flat [ ] ; Non-healing sores [ ] ; Stroke [ ] ; TIA [ ] ; Slurred speech [ ] ;  Neuro: Headaches[ ] ;  Vertigo[ ] ; Seizures[ ] ; Paresthesias[ ] ;Blurred vision [ ] ; Diplopia [ ] ; Vision changes [ ]   Ortho/Skin: Arthritis [ ] ; Joint pain [ ] ; Muscle pain [ ] ; Joint swelling [ ] ; Back Pain [ ] ; Rash [ ]   Psych: Depression[ ] ; Anxiety[ ]   Heme: Bleeding problems [ ] ; Clotting disorders [ ] ; Anemia Blue.Reese ]  Endocrine: Diabetes [ ] ; Thyroid dysfunction[y ]   Past Medical History:  Diagnosis Date   Drug use    ETOH abuse     Current Outpatient Medications  Medication Sig Dispense Refill   carvedilol (COREG) 6.25 MG tablet Take 1 tablet (6.25 mg total) by mouth 2 (two) times daily with a meal. 60 tablet 11   dapagliflozin propanediol (FARXIGA) 10 MG TABS tablet Take 1 tablet (10 mg total) by mouth daily. 30 tablet 11   hydrALAZINE (APRESOLINE) 25 MG tablet Take 1 tablet (25 mg total) by mouth 3 (three) times daily. 120 tablet 8   isosorbide mononitrate (IMDUR) 30 MG 24 hr tablet Take 1 tablet (30 mg total) by mouth daily. 30 tablet 11   methimazole (TAPAZOLE) 10 MG tablet Take 1 tablet (10 mg total) by mouth 3 (three) times daily. 90 tablet 3   potassium chloride SA (KLOR-CON M) 20 MEQ tablet Take 1 tablet (20 mEq total) by mouth 2 (two) times daily. 30 tablet 11   sacubitril-valsartan (ENTRESTO) 49-51 MG Take 1 tablet by mouth 2 (two) times daily. [place]  tablet 11   spironolactone (ALDACTONE) 25 MG tablet Take 1 tablet (25 mg total) by mouth daily. 30 tablet 11   No current facility-administered medications for this visit.   No Known Allergies  Social History   Socioeconomic History   Marital status: Single    Spouse name: Not on file   Number of children: Not on file   Years of education: Not on file   Highest education level: Not on file  Occupational History   Occupation: unemployed  Tobacco Use   Smoking status: Every Day    Packs/day: 0.25    Years: 5.00    Pack years: 1.25    Types: Cigarettes   Smokeless tobacco: Never  Substance and Sexual Activity   Alcohol use: Yes     Alcohol/week: 1.0 standard drink    Types: 1 Glasses of wine per week    Comment: occaisionally; once every two weeks    Drug use: Yes    Types: Marijuana, Cocaine    Comment: pt reports smoking everyday   Sexual activity: Yes    Birth control/protection: None  Other Topics Concern   Not on file  Social History Narrative   Not on file   Social Determinants of Health   Financial Resource Strain: Not on file  Food Insecurity: Not on file  Transportation Needs: Not on file  Physical Activity: Not on file  Stress: Not on file  Social Connections: Not on file  Intimate Partner Violence: Not on file   Family History  Problem Relation Age of Onset   Hypertension Mother    There were no vitals taken for this visit.  Wt Readings from Last 3 Encounters:  11/21/21 62.4 kg (137 lb 9.6 oz)  11/11/21 59.7 kg (131 lb 9.6 oz)  10/30/21 56.7 kg (125 lb)   PHYSICAL EXAM: General:  NAD. No resp difficulty HEENT: Normal Neck: Supple. No JVD. Carotids 2+ bilat; no bruits. No lymphadenopathy, + thyromegaly  Cor: PMI nondisplaced. Regular rate & rhythm. No rubs, gallops or murmurs. Lungs: Clear Abdomen: Soft, nontender, nondistended. No hepatosplenomegaly. No bruits or masses. Good bowel sounds. Extremities: No cyanosis, clubbing, rash, edema Neuro: Alert & oriented x 3, cranial nerves grossly intact. Moves all 4 extremities w/o difficulty. Affect pleasant.  ECG: NSR 75 bpm (personally reviewed).  ASSESSMENT & PLAN: 1. Chronic systolic CHF: Nonischemic cardiomyopathy.  Echo 1/23 EF 40-45% with inferior/inferoseptal HK, low normal RV function, mild-moderate MR, elevated AoV gradient at 11 mmHg but valve opens normally, dilated IVC, pleural effusion.  Patient abuses cocaine, also smokes and does drink some as well.  She has uncontrolled HTN.  No FH of CMP that she knows of.  Admitted with chest pressure but HS-TnI only mildly elevated with no trend, so doubt ACS.  Do not think new LBBB  represents STEMI equivalent.  Cardiomyopathy could be due to longstanding HTN versus cocaine.  Also suspect hyperthyroidism may play a role in both cardiomyopathy and HTN.  Cath showed low filling pressures and no CAD.  NYHA II- early III today, she does not appear volume overloaded on exam, ReDs 35%. She does not deep daily loop. - Start Farxiga 10 mg daily. - Restart Entresto 49/51 bid. BMET/BNP today. - Continue spironolactone 25 mg daily.  - Continue Bidil.  - Continue Coreg 6.25 mg bid.  - Needs to avoid cocaine and ETOH.  2. HTN: Long-standing.  Suspect hyperthyroidism plays a role. Renal artery dopplers did not show FMD.  BP better controlled today. Restart  Entresto as above. - Renin/aldosterone unrevealing. - Treating hyperthyroidism.  3. Fe deficiency anemia: FOBT negative.  May be due to heavy menses. Has had feraheme.  - Check CBC, Iron/TIBC and ferratin today. 4. Cocaine abuse: Counseled cessation.  5. Smoking: Congratulated on cessation.  6. Hyperthyroidism: TSH markedly low, during recent admit also with tachycardia/weight loss/cardiomyopathy.  ? Graves disease. Thyroid US c/w thyroiditis.  - Continue Coreg.  - Continue methimazole. - PCP managing.  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 She needs allowances for rest breaks/days off if needed.   Follow up with APP in 4 weeks (increase Entresto +/- Coreg) and in 12 weeks with Dr. Aundra Dubin + echo.  Allena Katz, FNP-BC 12/17/21

## 2021-12-18 ENCOUNTER — Telehealth (HOSPITAL_COMMUNITY): Payer: Self-pay

## 2021-12-18 NOTE — Telephone Encounter (Signed)
Called and was unable to confirm/remind patient of their appointment at the [facility] on 12/19/21.

## 2021-12-19 ENCOUNTER — Encounter (HOSPITAL_COMMUNITY): Payer: Self-pay

## 2021-12-22 ENCOUNTER — Other Ambulatory Visit (HOSPITAL_COMMUNITY): Payer: Self-pay

## 2021-12-25 ENCOUNTER — Telehealth (HOSPITAL_COMMUNITY): Payer: Self-pay | Admitting: Cardiology

## 2021-12-25 NOTE — Telephone Encounter (Signed)
Pt called to report yeast infection since starting farxiga ? ?Please advise  ?

## 2021-12-25 NOTE — Telephone Encounter (Signed)
Pt was advised to call if symptoms return ? ?Will send to PharmD to for dosing ? ?Diflucan 150 mg x1?  ?

## 2021-12-25 NOTE — Telephone Encounter (Signed)
Yes that's right, Diflucan 150 mg for 1 dose.

## 2021-12-25 NOTE — Telephone Encounter (Signed)
Give Diflucan, dosing per Lauren.  Would treat through this one, let us know if she has another one.  May need to stop Wilder Glade if frequent.  ?

## 2021-12-26 MED ORDER — FLUCONAZOLE 150 MG PO TABS: 150.0000 mg | ORAL_TABLET | Freq: Once | ORAL | 0 refills | Status: AC

## 2021-12-26 NOTE — Telephone Encounter (Signed)
Medication verified with PharmD  ? ?Attempted to contact patient  ?No answer-unable to leave message  ?

## 2022-01-05 ENCOUNTER — Telehealth (HOSPITAL_COMMUNITY): Payer: Self-pay

## 2022-01-05 NOTE — Telephone Encounter (Signed)
Called and was unable to confirm/remind patient of their appointment at the [facility] on 01/06/22.  ? ? ?

## 2022-01-06 ENCOUNTER — Encounter (HOSPITAL_COMMUNITY): Payer: Self-pay

## 2022-01-06 ENCOUNTER — Other Ambulatory Visit: Payer: Self-pay

## 2022-01-06 ENCOUNTER — Ambulatory Visit (HOSPITAL_COMMUNITY)
Admission: RE | Admit: 2022-01-06 | Discharge: 2022-01-06 | Disposition: A | Discharge status: Home / Self Care | Payer: Self-pay | Source: Ambulatory Visit | Attending: Cardiology | Admitting: Cardiology

## 2022-01-06 VITALS — BP 118/80 | HR 82 | Wt 133.6 lb

## 2022-01-06 DIAGNOSIS — E0590 Thyrotoxicosis, unspecified without thyrotoxic crisis or storm: Secondary | ICD-10-CM

## 2022-01-06 DIAGNOSIS — F17200 Nicotine dependence, unspecified, uncomplicated: Secondary | ICD-10-CM | POA: Insufficient documentation

## 2022-01-06 DIAGNOSIS — J90 Pleural effusion, not elsewhere classified: Secondary | ICD-10-CM | POA: Insufficient documentation

## 2022-01-06 DIAGNOSIS — I5022 Chronic systolic (congestive) heart failure: Secondary | ICD-10-CM

## 2022-01-06 DIAGNOSIS — F1910 Other psychoactive substance abuse, uncomplicated: Secondary | ICD-10-CM

## 2022-01-06 DIAGNOSIS — F1410 Cocaine abuse, uncomplicated: Secondary | ICD-10-CM | POA: Insufficient documentation

## 2022-01-06 DIAGNOSIS — I428 Other cardiomyopathies: Secondary | ICD-10-CM | POA: Insufficient documentation

## 2022-01-06 DIAGNOSIS — I110 Hypertensive heart disease with heart failure: Secondary | ICD-10-CM | POA: Insufficient documentation

## 2022-01-06 DIAGNOSIS — D509 Iron deficiency anemia, unspecified: Secondary | ICD-10-CM

## 2022-01-06 DIAGNOSIS — I10 Essential (primary) hypertension: Secondary | ICD-10-CM

## 2022-01-06 DIAGNOSIS — Z79899 Other long term (current) drug therapy: Secondary | ICD-10-CM | POA: Insufficient documentation

## 2022-01-06 LAB — BASIC METABOLIC PANEL
Anion gap: 7 (ref 5–15)
BUN: 11 mg/dL (ref 6–20)
CO2: 25 mmol/L (ref 22–32)
Calcium: 8.7 mg/dL — ABNORMAL LOW (ref 8.9–10.3)
Chloride: 100 mmol/L (ref 98–111)
Creatinine, Ser: 1.23 mg/dL — ABNORMAL HIGH (ref 0.44–1.00)
GFR, Estimated: 56 mL/min — ABNORMAL LOW (ref >60)
Glucose, Bld: 122 mg/dL — ABNORMAL HIGH (ref 70–99)
Potassium: 3.7 mmol/L (ref 3.5–5.1)
Sodium: 132 mmol/L — ABNORMAL LOW (ref 135–145)

## 2022-01-06 LAB — IRON AND TIBC
Iron: 79 ug/dL (ref 28–170)
Saturation Ratios: 22 % (ref 10.4–31.8)
TIBC: 356 ug/dL (ref 250–450)
UIBC: 277 ug/dL

## 2022-01-06 LAB — FERRITIN: Ferritin: 11 ng/mL (ref 11–307)

## 2022-01-06 MED ORDER — ENTRESTO 97-103 MG PO TABS: 1.0000 | ORAL_TABLET | Freq: Two times a day (BID) | ORAL | 4 refills | Status: DC

## 2022-01-06 NOTE — Patient Instructions (Signed)
Thank you for coming in today ? ?Labs were done today, if any labs are abnormal the clinic will call you ? ?INCREASE Entresto to 97/103 mg 1 tablet twice daily  ? ?PLEASE RESCHEDULE primary care physician appointment with Dr. Margarita Rana ? ?Your physician recommends that you schedule a follow-up appointment in:  ?Keep follow up appointment with Dr. Aundra Dubin with echocardiogram ? ?At the [facility], you and your health needs are our priority. As part of our continuing mission to provide you with exceptional heart care, we have created designated Provider Care Teams. These Care Teams include your primary Cardiologist (physician) and Advanced Practice Providers (APPs- Physician Assistants and Nurse Practitioners) who all work together to provide you with the care you need, when you need it.  ? ?You may see any of the following providers on your designated Care Team at your next follow up: ?Dr Glori Bickers ?Dr Loralie Champagne ?Darrick Grinder, NP ?Lyda Jester, PA ?Jessica Milford,NP ?Marlyce Huge, PA ?Audry Riles, PharmD ? ? ?Please be sure to bring in all your medications bottles to every appointment.  ?If you have any questions or concerns before your next appointment please send [place] a message through [place] or call our office at [phone].   ? ?TO LEAVE A MESSAGE FOR THE NURSE SELECT OPTION 2, PLEASE LEAVE A MESSAGE INCLUDING: ?YOUR NAME ?DATE OF BIRTH ?CALL BACK NUMBER ?REASON FOR CALL**this is important as we prioritize the call backs ? ?YOU WILL RECEIVE A CALL BACK THE SAME DAY AS LONG AS YOU CALL BEFORE 4:00 PM ? ?

## 2022-01-06 NOTE — Progress Notes (Signed)
? ?ADVANCED HF CLINIC NOTE ? ? ?Primary Care: Charlott Rakes, MD ?HF Cardiologist: Dr. Aundra Dubin ? ?HPI: ?Judith Lowe is a 41 y.o. female with no prior routine medical care, ETOH and substance abuse (cocaine), tobacco user and new diagnosis of HTN, IDA, hyperthyroidism, and systolic heart failure. ? ?She was admitted 1/23 with new acute heart failure, hypoxia requiring BiPap and hypertensive urgency. She was diuresed with IV Lasix and started on nitro gtt. Echo showed EF 40%, RV normal, mild MR and mild pericardial effusion. UDS + cocaine/THC. ECG with new LBBB. Underwent R/LHC showing no CAD and low filling pressures. She was started on GDMT. TSH low and started on methimazole. Discharge weight 113 lbs. ? ?Today she returns for HF follow up. Had recent yeast infection and treated with fluconazole. 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 She is SOB with stairs but does ok walking on flat ground. Denies abnormal bleeding, palpitations, CP, dizziness, edema, or PND/Orthopnea. Appetite ok. No fever or chills. Weight at home 127 pounds. Taking all medications. Reports no further ETOH/cocaine/tobacco use. ? ?REDs: 36% ? ?Labs (1/23): K 3.1, creatinine 0.83 ? ?Past Medical History:  ?Diagnosis Date  ? Drug use   ? ETOH abuse   ? ?Current Outpatient Medications  ?Medication Sig Dispense Refill  ? carvedilol (COREG) 6.25 MG tablet Take 1 tablet (6.25 mg total) by mouth 2 (two) times daily with a meal. 60 tablet 11  ? dapagliflozin propanediol (FARXIGA) 10 MG TABS tablet Take 1 tablet (10 mg total) by mouth daily. 30 tablet 11  ? hydrALAZINE (APRESOLINE) 25 MG tablet Take 1 tablet (25 mg total) by mouth 3 (three) times daily. 120 tablet 8  ? isosorbide mononitrate (IMDUR) 30 MG 24 hr tablet Take 1 tablet (30 mg total) by mouth daily. 30 tablet 11  ? methimazole (TAPAZOLE) 10 MG tablet Take 1 tablet (10 mg total) by mouth 3 (three) times  daily. 90 tablet 3  ? potassium chloride SA (KLOR-CON M) 20 MEQ tablet Take 1 tablet (20 mEq total) by mouth 2 (two) times daily. 30 tablet 11  ? sacubitril-valsartan (ENTRESTO) 49-51 MG Take 1 tablet by mouth 2 (two) times daily. 60 tablet 11  ? spironolactone (ALDACTONE) 25 MG tablet Take 1 tablet (25 mg total) by mouth daily. 30 tablet 11  ? ?No current facility-administered medications for this encounter.  ? ?No Known Allergies ? ?Social History  ? ?Socioeconomic History  ? Marital status: Single  ?  Spouse name: Not on file  ? Number of children: Not on file  ? Years of education: Not on file  ? Highest education level: Not on file  ?Occupational History  ? Occupation: unemployed  ?Tobacco Use  ? Smoking status: Every Day  ?  Packs/day: 0.25  ?  Years: 5.00  ?  Pack years: 1.25  ?  Types: Cigarettes  ? Smokeless tobacco: Never  ?Substance and Sexual Activity  ? Alcohol use: Yes  ?  Alcohol/week: 1.0 standard drink  ?  Types: 1 Glasses of wine per week  ?  Comment: occaisionally; once every two weeks   ? Drug use: Yes  ?  Types: Marijuana, Cocaine  ?  Comment: pt reports smoking everyday  ? Sexual activity: Yes  ?  Birth control/protection: None  ?Other Topics Concern  ? Not on file  ?Social History Narrative  ? Not on file  ? ?Social Determinants of Health  ? ?  Financial Resource Strain: Not on file  ?Food Insecurity: Not on file  ?Transportation Needs: Not on file  ?Physical Activity: Not on file  ?Stress: Not on file  ?Social Connections: Not on file  ?Intimate Partner Violence: Not on file  ? ?Family History  ?Problem Relation Age of Onset  ? Hypertension Mother   ? ?BP 118/80   Pulse 82   Wt 60.6 kg (133 lb 9.6 oz)   SpO2 97%   BMI 20.31 kg/m?  ? ?Wt Readings from Last 3 Encounters:  ?01/06/22 60.6 kg (133 lb 9.6 oz)  ?11/21/21 62.4 kg (137 lb 9.6 oz)  ?11/11/21 59.7 kg (131 lb 9.6 oz)  ? ?PHYSICAL EXAM: ?General:  NAD. No resp difficulty, fatigued-appearing ?HEENT: Normal ?Neck: Supple. JVP 6-7.  Carotids 2+ bilat; no bruits. No lymphadenopathy or thryomegaly appreciated. ?Cor: PMI nondisplaced. Regular rate & rhythm. No rubs, gallops or murmurs. ?Lungs: Clear ?Abdomen: Soft, nontender, nondistended. No hepatosplenomegaly. No bruits or masses. Good bowel sounds. ?Extremities: No cyanosis, clubbing, rash, edema ?Neuro: Alert & oriented x 3, cranial nerves grossly intact. Moves all 4 extremities w/o difficulty. Appears to be under the influence today. ? ?ASSESSMENT & PLAN: ?1. Chronic systolic CHF: Nonischemic cardiomyopathy.  Echo 1/23 EF 40-45% with inferior/inferoseptal HK, low normal RV function, mild-moderate MR, elevated AoV gradient at 11 mmHg but valve opens normally, dilated IVC, pleural effusion.  Patient abuses cocaine, also smokes and does drink some as well.  She has uncontrolled HTN.  No FH of CMP that she knows of.  Admitted with chest pressure but HS-TnI only mildly elevated with no trend, so doubt ACS.  Do not think new LBBB represents STEMI equivalent.  Cardiomyopathy could be due to longstanding HTN versus cocaine.  Also suspect hyperthyroidism may play a role in both cardiomyopathy and HTN.  Cath showed low filling pressures and no CAD.  NYHA II- early III today (mostly fatigue), she appears mildly volume overloaded on exam, ReDs 36%.  ?- Increase Entresto to 97/103 mg bid. BMET today, repeat in 10-14 days. ?- Continue Farxiga 10 mg daily. ?- Continue spironolactone 25 mg daily.  ?- Continue hydralazine 25 mg tid + Imdur 30 mg daily. ?- Continue Coreg 6.25 mg bid.  ?- Needs to avoid cocaine and ETOH. Appears to be under influence today. ?- Repeat echo next visit. ?2. HTN: Long-standing.  Suspect hyperthyroidism plays a role. Renal artery dopplers did not show FMD.   ?- Renin/aldosterone unrevealing. ?- Treating hyperthyroidism.  ?3. Fe deficiency anemia: Has had feraheme in the past. Check iron studies today, and arrange infusion is low. I suspect this is contributing to her fatigue. ?4.  Cocaine abuse: Counseled cessation.  ?5. Smoking: Congratulated on cessation.  ?6. Hyperthyroidism: TSH markedly low, during recent admit also with tachycardia/weight loss/cardiomyopathy.  ? Graves disease. Thyroid US c/w thyroiditis.  ?- Continue Coreg.  ?- Continue methimazole. ?- PCP managing. Advised re-scheduling follow up soon.  ?  ?Follow up next month with Dr. Aundra Dubin + echo as scheduled. ? ?Allena Katz, FNP-BC ?01/06/22 ? ?

## 2022-01-06 NOTE — Progress Notes (Signed)
ReDS Vest / Clip - 01/06/22 0800   ? ?  ? ReDS Vest / Clip  ? Station Marker C   ? Ruler Value 19.5   ? ReDS Value Range Moderate volume overload   ? ReDS Actual Value 36   ? ?  ?  ? ?  ? ? ?

## 2022-01-14 ENCOUNTER — Encounter (HOSPITAL_COMMUNITY): Payer: Self-pay | Admitting: Cardiology

## 2022-01-28 ENCOUNTER — Other Ambulatory Visit (HOSPITAL_COMMUNITY): Payer: Self-pay

## 2022-02-16 ENCOUNTER — Ambulatory Visit (HOSPITAL_BASED_OUTPATIENT_CLINIC_OR_DEPARTMENT_OTHER)
Admission: RE | Admit: 2022-02-16 | Discharge: 2022-02-16 | Disposition: A | Discharge status: Home / Self Care | Payer: Self-pay | Source: Ambulatory Visit | Attending: Cardiology | Admitting: Cardiology

## 2022-02-16 ENCOUNTER — Other Ambulatory Visit (HOSPITAL_COMMUNITY): Payer: Self-pay

## 2022-02-16 ENCOUNTER — Ambulatory Visit (HOSPITAL_COMMUNITY)
Admission: RE | Admit: 2022-02-16 | Discharge: 2022-02-16 | Disposition: A | Discharge status: Home / Self Care | Payer: Self-pay | Source: Ambulatory Visit | Attending: Cardiology | Admitting: Cardiology

## 2022-02-16 VITALS — BP 126/67 | HR 66 | Wt 132.4 lb

## 2022-02-16 DIAGNOSIS — I5022 Chronic systolic (congestive) heart failure: Secondary | ICD-10-CM

## 2022-02-16 DIAGNOSIS — I509 Heart failure, unspecified: Secondary | ICD-10-CM

## 2022-02-16 DIAGNOSIS — I110 Hypertensive heart disease with heart failure: Secondary | ICD-10-CM | POA: Insufficient documentation

## 2022-02-16 DIAGNOSIS — I428 Other cardiomyopathies: Secondary | ICD-10-CM | POA: Insufficient documentation

## 2022-02-16 DIAGNOSIS — D509 Iron deficiency anemia, unspecified: Secondary | ICD-10-CM | POA: Insufficient documentation

## 2022-02-16 DIAGNOSIS — Z79899 Other long term (current) drug therapy: Secondary | ICD-10-CM | POA: Insufficient documentation

## 2022-02-16 DIAGNOSIS — E0590 Thyrotoxicosis, unspecified without thyrotoxic crisis or storm: Secondary | ICD-10-CM | POA: Insufficient documentation

## 2022-02-16 LAB — BASIC METABOLIC PANEL
Anion gap: 6 (ref 5–15)
BUN: 8 mg/dL (ref 6–20)
CO2: 27 mmol/L (ref 22–32)
Calcium: 9.1 mg/dL (ref 8.9–10.3)
Chloride: 103 mmol/L (ref 98–111)
Creatinine, Ser: 1.19 mg/dL — ABNORMAL HIGH (ref 0.44–1.00)
GFR, Estimated: 59 mL/min — ABNORMAL LOW (ref >60)
Glucose, Bld: 95 mg/dL (ref 70–99)
Potassium: 4.6 mmol/L (ref 3.5–5.1)
Sodium: 136 mmol/L (ref 135–145)

## 2022-02-16 LAB — ECHOCARDIOGRAM COMPLETE
AR max vel: 2.82 cm2
AV Peak grad: 7.5 mmHg
Ao pk vel: 1.37 m/s
Area-P 1/2: 3.66 cm2
S' Lateral: 2.6 cm

## 2022-02-16 LAB — BRAIN NATRIURETIC PEPTIDE: B Natriuretic Peptide: 18.4 pg/mL (ref 0.0–100.0)

## 2022-02-16 LAB — T4, FREE: Free T4: <0.25 ng/dL — ABNORMAL LOW (ref 0.61–1.12)

## 2022-02-16 LAB — TSH: TSH: 92.257 u[IU]/mL — ABNORMAL HIGH (ref 0.350–4.500)

## 2022-02-16 MED ORDER — FUROSEMIDE 20 MG PO TABS: 20.0000 mg | ORAL_TABLET | Freq: Every day | ORAL | 3 refills | Status: DC

## 2022-02-16 MED ORDER — POTASSIUM CHLORIDE ER 10 MEQ PO TBCR: 10.0000 meq | EXTENDED_RELEASE_TABLET | Freq: Every day | ORAL | 3 refills | Status: DC

## 2022-02-16 NOTE — Patient Instructions (Addendum)
Start Fursomide (lasix) '20mg'$  daily ? ?Start potassium 76mq (1 Tab) daily. ? ?Labs done today, your results will be available in MyChart, we will contact you for abnormal readings. ? ?Your physician recommends that you schedule a follow-up appointment in: 3 months ? ?If you have any questions or concerns before your next appointment please send [place] message [place] call our office at [phone]   ? ?TO LEAVE A MESSAGE FOR THE NURSE SELECT OPTION 2, PLEASE LEAVE A MESSAGE INCLUDING: ?YOUR NAME ?DATE OF BIRTH ?CALL BACK NUMBER ?REASON FOR CALL**this is important as we prioritize the call backs ? ?YOU WILL RECEIVE A CALL BACK THE SAME DAY AS LONG AS YOU CALL BEFORE 4:00 PM ?At the [facility] you and your health needs are our priority. As part of our continuing mission to provide you with exceptional heart care, we have created designated Provider Care Teams. These Care Teams include your primary Cardiologist (physician) and Advanced Practice Providers (APPs- Physician Assistants and Nurse Practitioners) who all work together to provide you with the care you need, when you need it.  ? ?You may see any of the following providers on your designated Care Team at your next follow up: ?Dr DGlori Bickers?Dr DLoralie Champagne?ADarrick Grinder NP ?BLyda Jester PA ?Jessica Milford,NP ?LMarlyce Huge PA ?LAudry Riles PharmD ? ? ?Please be sure to bring in all your medications bottles to every appointment.  ? ? ?

## 2022-02-16 NOTE — Progress Notes (Signed)
? ?ADVANCED HF CLINIC NOTE ? ? ?Primary Care: Charlott Rakes, MD ?HF Cardiologist: Dr. Aundra Dubin ? ?HPI: ?Judith Lowe is a 41 y.o. female with history of ETOH and substance abuse (cocaine), tobacco user and new diagnosis of HTN, IDA, hyperthyroidism, and systolic heart failure. ? ?She was admitted 12/22 with new acute heart failure, hypoxia requiring BiPap and hypertensive urgency. She was diuresed with IV Lasix and started on nitro gtt. Echo showed EF 40%, RV normal, mild MR and mild pericardial effusion. UDS + cocaine/THC. ECG with new LBBB. Underwent R/LHC showing no CAD and low filling pressures. She was started on GDMT. TSH low with high free T4 suggestive of hyperthyroidism, she was started on methimazole. Discharge weight 113 lbs. ? ?Echo was done today and reviewed, EF 27%, normal diastolic function, mild LVH, normal RV, small pericardial effusion, dilated IVC, PASP 35 mmHg.  ? ?Patient returns for followup of CHF.  Weight is down 1 lb.  She reports significant fatigue/tiredness.  She gets short of breath if she walks too fast or walks up stairs.  She works at [employer], gets tired at times at work.  No lightheadedness.  No orthopnea/PND. She is not drinking ETOH or using cocaine.  No chest pain. No palpitations.  ? ?Labs (1/23): K 3.1, creatinine 0.83 ?Labs (3/23): K 3.7, creatinine 1.23 ? ?PMH: ?1. H/o ETOH abuse ?2. H/o cocaine abuse ?3. Hyperthyroidism ?4. HTN ?5. Fe deficiency anemia ?6. Chronic systolic CHF: Nonischemic cardiomyopathy.  ?- Echo (12/22): EF 40%, RV normal, mild MR and mild pericardial effusion ?- LHC/RHC (12/22): No significant CAD; mean RA 1, PA 24/5, mean PCWP 5, CI 3.8 ?- Echo (4/23): EF 78%, normal diastolic function, mild LVH, normal RV, small pericardial effusion, dilated IVC, PASP 35 mmHg.  ? ?Current Outpatient Medications  ?Medication Sig Dispense Refill  ? carvedilol (COREG) 6.25 MG tablet Take 1 tablet (6.25 mg total) by mouth 2 (two) times daily with a meal. 60 tablet 11  ?  dapagliflozin propanediol (FARXIGA) 10 MG TABS tablet Take 1 tablet (10 mg total) by mouth daily. 30 tablet 11  ? furosemide (LASIX) 20 MG tablet Take 1 tablet (20 mg total) by mouth daily. 90 tablet 3  ? hydrALAZINE (APRESOLINE) 25 MG tablet Take 1 tablet (25 mg total) by mouth 3 (three) times daily. 120 tablet 8  ? isosorbide mononitrate (IMDUR) 30 MG 24 hr tablet Take 1 tablet (30 mg total) by mouth daily. 30 tablet 11  ? methimazole (TAPAZOLE) 10 MG tablet Take 1 tablet (10 mg total) by mouth 3 (three) times daily. 90 tablet 3  ? potassium chloride (KLOR-CON) 10 MEQ tablet Take 1 tablet (10 mEq total) by mouth daily. 90 tablet 3  ? sacubitril-valsartan (ENTRESTO) 97-103 MG Take 1 tablet by mouth 2 (two) times daily. 60 tablet 4  ? spironolactone (ALDACTONE) 25 MG tablet Take 1 tablet (25 mg total) by mouth daily. 30 tablet 11  ? ?No current facility-administered medications for this encounter.  ? ?No Known Allergies ? ?Social History  ? ?Socioeconomic History  ? Marital status: Single  ?  Spouse name: Not on file  ? Number of children: Not on file  ? Years of education: Not on file  ? Highest education level: Not on file  ?Occupational History  ? Occupation: unemployed  ?Tobacco Use  ? Smoking status: Every Day  ?  Packs/day: 0.25  ?  Years: 5.00  ?  Pack years: 1.25  ?  Types: Cigarettes  ? Smokeless tobacco:  Never  ?Substance and Sexual Activity  ? Alcohol use: Yes  ?  Alcohol/week: 1.0 standard drink  ?  Types: 1 Glasses of wine per week  ?  Comment: occaisionally; once every two weeks   ? Drug use: Yes  ?  Types: Marijuana, Cocaine  ?  Comment: pt reports smoking everyday  ? Sexual activity: Yes  ?  Birth control/protection: None  ?Other Topics Concern  ? Not on file  ?Social History Narrative  ? Not on file  ? ?Social Determinants of Health  ? ?Financial Resource Strain: Not on file  ?Food Insecurity: Not on file  ?Transportation Needs: Not on file  ?Physical Activity: Not on file  ?Stress: Not on file   ?Social Connections: Not on file  ?Intimate Partner Violence: Not on file  ? ?Family History  ?Problem Relation Age of Onset  ? Hypertension Mother   ? ?BP 126/67   Pulse 66   Wt 60.1 kg (132 lb 6.4 oz)   SpO2 99%   BMI 20.13 kg/m?  ? ?Wt Readings from Last 3 Encounters:  ?02/16/22 60.1 kg (132 lb 6.4 oz)  ?01/06/22 60.6 kg (133 lb 9.6 oz)  ?11/21/21 62.4 kg (137 lb 9.6 oz)  ? ?PHYSICAL EXAM: ?General: NAD ?Neck: No JVD, +symmetric thyromegaly.  ?Lungs: Clear to auscultation bilaterally with normal respiratory effort. ?CV: Nondisplaced PMI.  Heart regular S1/S2, no S3/S4, no murmur.  No peripheral edema.  No carotid bruit.  Normal pedal pulses.  ?Abdomen: Soft, nontender, no hepatosplenomegaly, no distention.  ?Skin: Intact without lesions or rashes.  ?Neurologic: Alert and oriented x 3.  ?Psych: Normal affect. ?Extremities: No clubbing or cyanosis.  ?HEENT: Normal.  ? ?ASSESSMENT & PLAN: ?1. Chronic systolic CHF: Nonischemic cardiomyopathy.  Echo 1/23 EF 40-45% with inferior/inferoseptal HK, low normal RV function, mild-moderate MR, elevated AoV gradient at 11 mmHg but valve opens normally, dilated IVC, pleural effusion.  She has history of cocaine and ETOH abuse. Also h/o uncontrolled HTN.  No FH of CMP that she knows of.  She was found to have hyperthyroidism which may play a role in both cardiomyopathy and HTN.  Cath in 12/22 showed low filling pressures and no CAD.  Echo was done today and showed recovery of EF to 55% with normal diastolic function and normal RV, but IVC dilated.  Interestingly, patient still has NYHA class III symptoms with prominent fatigue.  I wonder if this has something to do with ongoing thyroid dysfunction.  ?- With dilated IVC, I will start Lasix 20 mg daily with KCl 10 daily.  BMET today and in 10 days.  ?- Continue Entresto 97/103 mg bid.  ?- Continue Farxiga 10 mg daily. ?- Continue spironolactone 25 mg daily.  ?- Continue hydralazine 25 mg tid + Imdur 30 mg daily. ?- Continue  Coreg 6.25 mg bid.  ?- Needs to avoid cocaine and ETOH.  ?- Needs repeat TSH, free T4, free T3 to assess for ongoing thyroid abnormalities.  ?2. HTN: Long-standing.  Suspect hyperthyroidism plays a role. Renal artery dopplers did not show FMD.  Renin/aldosterone levels unrevealing.  BP now controlled on current med regimen.  ?3. Fe deficiency anemia: Has been treated with feraheme.  ?4. Hyperthyroidism: TSH markedly low during 12/22 admit, also with tachycardia/weight loss/cardiomyopathy.  Thyroid US c/w thyroiditis. Patient has been on methimazole.  She has ongoing fatigue and dyspnea, I wonder if thyroid abnormality is playing an ongoing role in his as LV function has recovered.  ?- Continue Coreg.  ?-  Continue methimazole. ?- Need to recheck TSH, free T3, free T4 today.  ?- I would like her to have an appointment with endocrinology, will schedule.  ?  ?Followup 3 months with APP.  ? ?Loralie Champagne ?02/16/2022 ? ?

## 2022-02-16 NOTE — Progress Notes (Signed)
Medication Samples have been provided to the patient. ? ?Drug name: Phineas Real       Strength: '10mg'$         Qty: 4 boxes  LOT: [id number]  Exp.Date: 08-25-2024 ? ?Dosing instructions: take 1 tablet daily  ? ?The patient has been instructed regarding the correct time, dose, and frequency of taking this medication, including desired effects and most common side effects.  ? ?Judith Lowe M Sota Hetz ?9:35 AM ?02/16/2022 ? ?

## 2022-02-17 LAB — T3, FREE: T3, Free: <0.3 pg/mL — ABNORMAL LOW (ref 2.0–4.4)

## 2022-02-26 ENCOUNTER — Telehealth (HOSPITAL_COMMUNITY): Payer: Self-pay | Admitting: *Deleted

## 2022-02-26 NOTE — Telephone Encounter (Signed)
Forms are awaiting MD signature ?

## 2022-02-26 NOTE — Telephone Encounter (Signed)
Form completed and signed by Dr Aundra Dubin, pt is aware and will p/u at front desk ?

## 2022-02-26 NOTE — Telephone Encounter (Signed)
Pt left vm stating she needs her FMLA paperwork to be completed asap. Pt asked to be called back about the status of paperwork.   ? ?Routed to [employer]  ?

## 2022-04-14 ENCOUNTER — Ambulatory Visit: Payer: Self-pay | Admitting: Family Medicine

## 2022-05-08 ENCOUNTER — Encounter (HOSPITAL_COMMUNITY): Payer: Medicaid Other | Admitting: Cardiology

## 2022-06-05 IMAGING — US US THYROID
1 series · 13 of 25 positions shown · non-contrast
Comparison: None.

CLINICAL DATA: Hyperthyroid.

EXAM:
THYROID ULTRASOUND
TECHNIQUE: Ultrasound examination of the thyroid gland and adjacent soft
tissues was performed.

[Series 1: us thyroid · 13 of 69 slices shown]
[im 1/69]
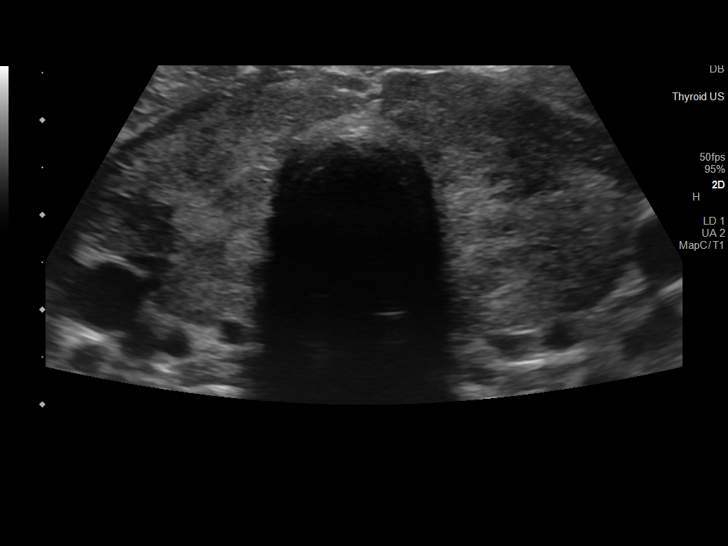
[im 6/69]
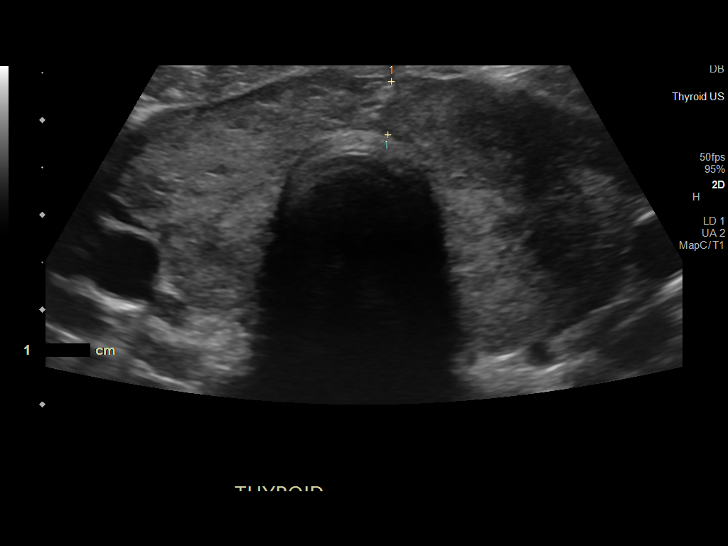
[im 12/69]
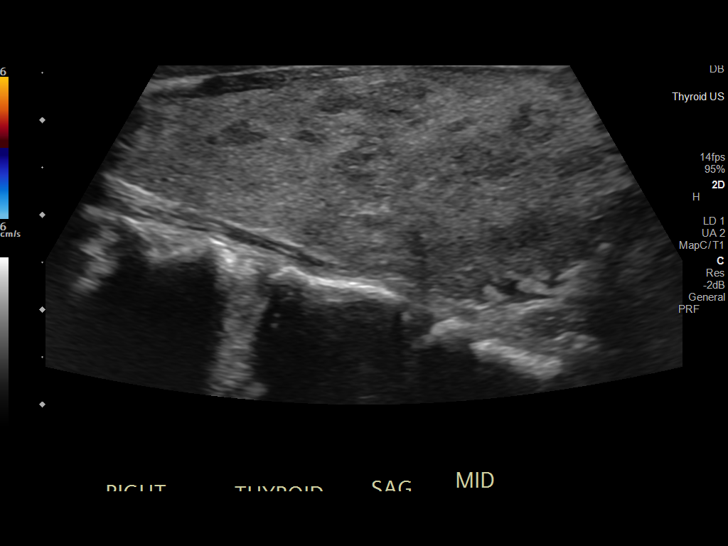
[im 18/69]
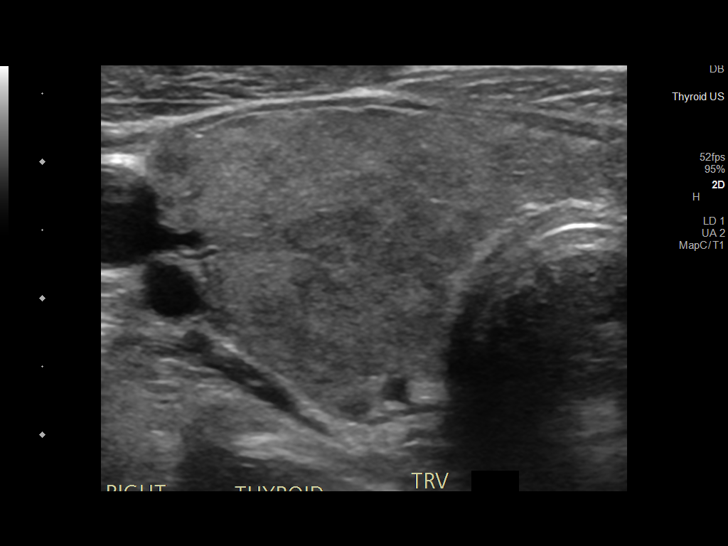
[im 23/69]
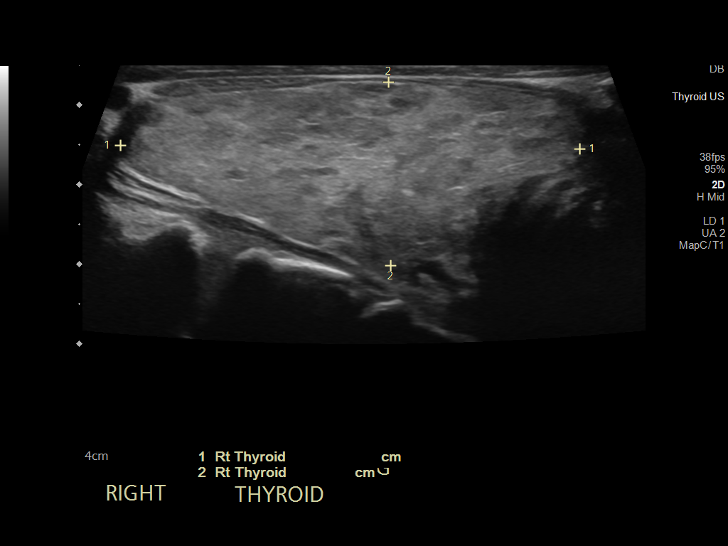
[im 29/69]
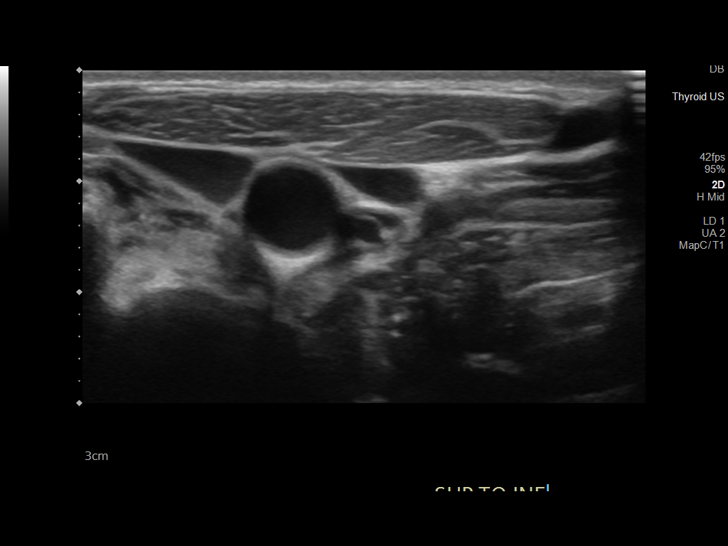
[im 35/69]
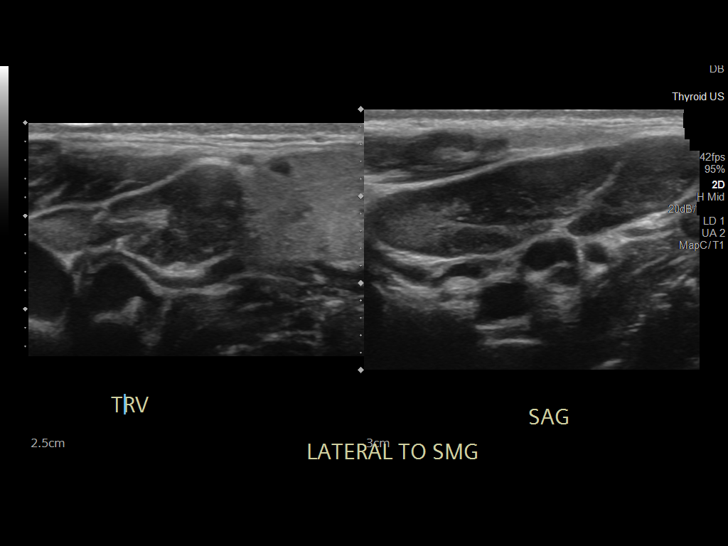
[im 40/69]
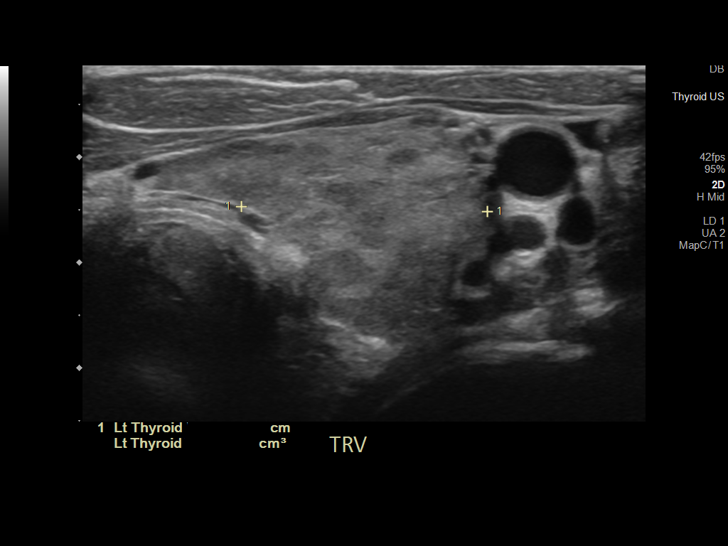
[im 46/69]
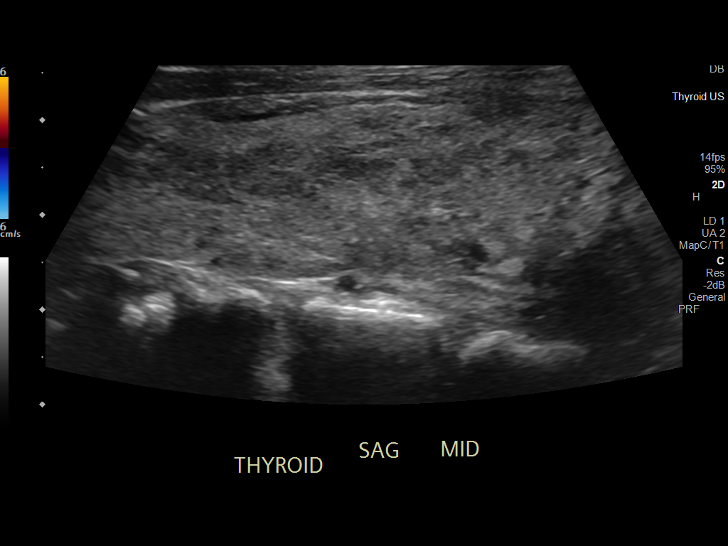
[im 52/69]
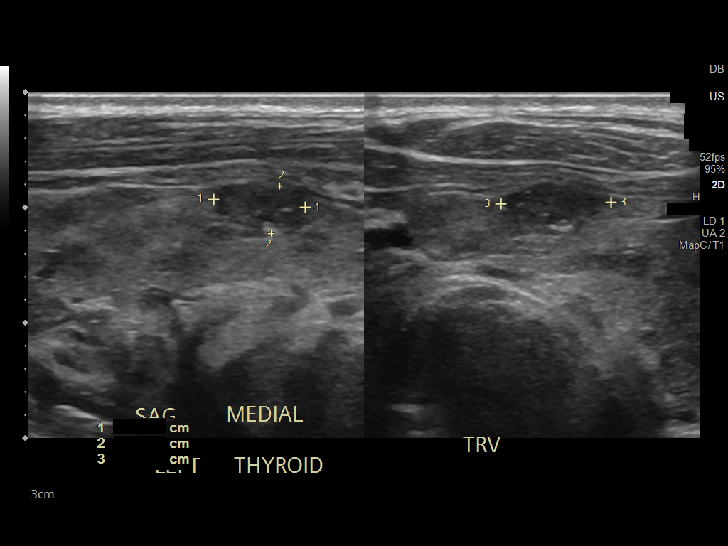
[im 57/69]
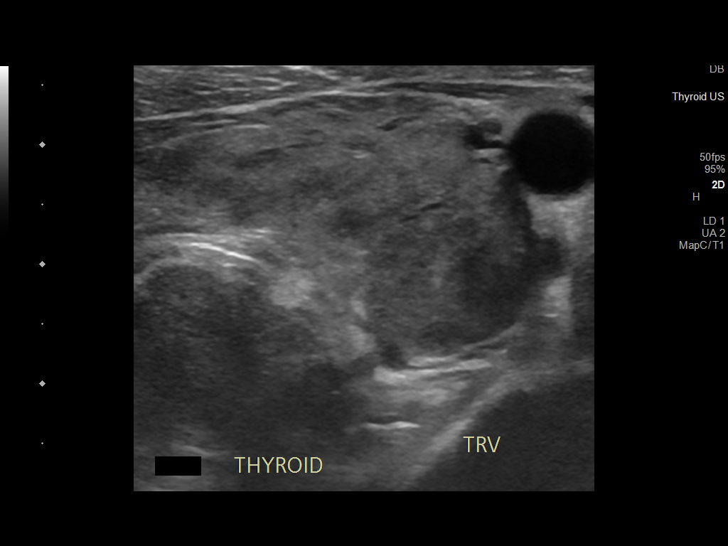
[im 63/69]
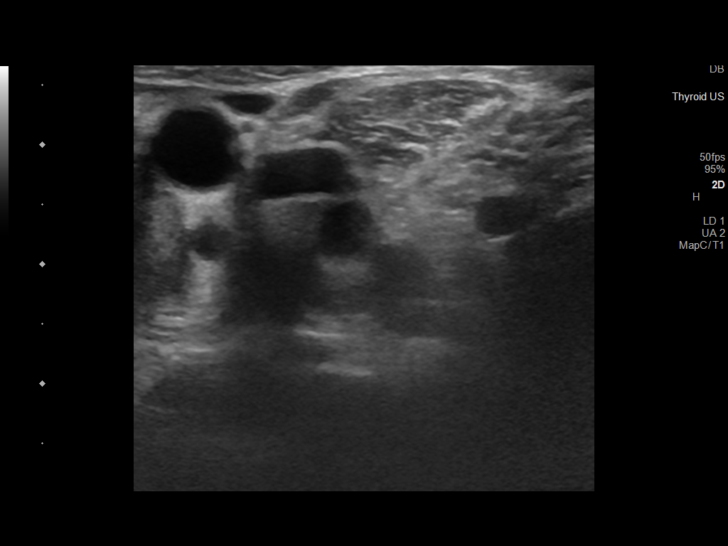
[im 69/69]
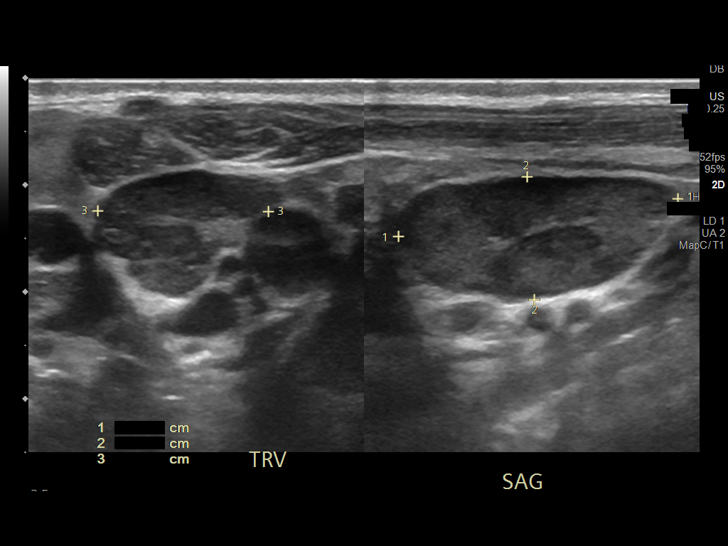

[13 of 25 positions shown; findings below may reference images not displayed]

FINDINGS: Parenchymal Echotexture: Markedly heterogenous

Isthmus: 0.5 cm

Right lobe: 5.8 x 2.3 x 3.1 cm

Left lobe: 6.2 x 2.1 x 2.3 cm

_________________________________________________________

Estimated total number of nodules >/= 1 cm: 0

Number of spongiform nodules >/=  2 cm not described below (TR1): 0

Number of mixed cystic and solid nodules >/= 1.5 cm not described
below (TR2): 0

_________________________________________________________

No discrete nodules are seen within the thyroid gland. The thyroid
gland is subjectively hypervascular throughout both lobes. Mildly
prominent cervical lymph nodes with a left cervical lymph node
measuring 1.2 cm in short axis and a right cervical lymph node
measuring 0.9 cm. These are still within normal limits in size and
may be reactive in nature.
IMPRESSION: Mildly enlarged, very heterogeneous and hypervascular thyroid gland.
Findings are consistent with thyroiditis. No discrete thyroid
nodules are identified.

The above is in keeping with the ACR TI-RADS recommendations - [HOSPITAL] 3396;[DATE].

## 2022-06-24 ENCOUNTER — Ambulatory Visit (HOSPITAL_COMMUNITY)
Admission: RE | Admit: 2022-06-24 | Discharge: 2022-06-24 | Disposition: A | Discharge status: Home / Self Care | Payer: Medicaid Other | Source: Ambulatory Visit | Attending: Cardiology | Admitting: Cardiology

## 2022-06-24 ENCOUNTER — Encounter (HOSPITAL_COMMUNITY): Payer: Self-pay | Admitting: Cardiology

## 2022-06-24 VITALS — BP 94/60 | HR 89 | Wt 131.4 lb

## 2022-06-24 DIAGNOSIS — I5022 Chronic systolic (congestive) heart failure: Secondary | ICD-10-CM | POA: Insufficient documentation

## 2022-06-24 LAB — TSH: TSH: 1.521 u[IU]/mL (ref 0.350–4.500)

## 2022-06-24 LAB — BASIC METABOLIC PANEL
Anion gap: 8 (ref 5–15)
BUN: 10 mg/dL (ref 6–20)
CO2: 26 mmol/L (ref 22–32)
Calcium: 9.1 mg/dL (ref 8.9–10.3)
Chloride: 106 mmol/L (ref 98–111)
Creatinine, Ser: 0.68 mg/dL (ref 0.44–1.00)
GFR, Estimated: >60 mL/min (ref >60)
Glucose, Bld: 83 mg/dL (ref 70–99)
Potassium: 4.4 mmol/L (ref 3.5–5.1)
Sodium: 140 mmol/L (ref 135–145)

## 2022-06-24 LAB — T4, FREE: Free T4: 0.58 ng/dL — ABNORMAL LOW (ref 0.61–1.12)

## 2022-06-24 MED ORDER — DAPAGLIFLOZIN PROPANEDIOL 10 MG PO TABS: 10.0000 mg | ORAL_TABLET | Freq: Every day | ORAL | 11 refills | Status: DC

## 2022-06-24 NOTE — Patient Instructions (Signed)
STOP Imdur and Hydralazine   Labs done today, your results will be available in MyChart, we will contact you for abnormal readings.  Your physician recommends that you schedule a follow-up appointment in: 6 months ( February 2024)  ** please call the office in November to arrange your follow up appointment   If you have any questions or concerns before your next appointment please send [place] a message through [place] or call our office at [phone].    TO LEAVE A MESSAGE FOR THE NURSE SELECT OPTION 2, PLEASE LEAVE A MESSAGE INCLUDING: YOUR NAME DATE OF BIRTH CALL BACK NUMBER REASON FOR CALL**this is important as we prioritize the call backs  YOU WILL RECEIVE A CALL BACK THE SAME DAY AS LONG AS YOU CALL BEFORE 4:00 PM  At the [facility], you and your health needs are our priority. As part of our continuing mission to provide you with exceptional heart care, we have created designated Provider Care Teams. These Care Teams include your primary Cardiologist (physician) and Advanced Practice Providers (APPs- Physician Assistants and Nurse Practitioners) who all work together to provide you with the care you need, when you need it.   You may see any of the following providers on your designated Care Team at your next follow up: Dr Glori Bickers Dr Loralie Champagne Dr. Roxana Hires, NP Lyda Jester, [place] [facility] [place], [place] Forestine Na, NP Audry Riles, PharmD   Please be sure to bring in all your medications bottles to every appointment.

## 2022-06-25 LAB — T3, FREE: T3, Free: 2.7 pg/mL (ref 2.0–4.4)

## 2022-06-25 NOTE — Progress Notes (Signed)
ADVANCED HF CLINIC NOTE   Primary Care: Charlott Rakes, MD HF Cardiologist: Dr. Aundra Dubin  HPI: Judith Lowe is a 41 y.o. female with history of ETOH and substance abuse (cocaine), tobacco user and new diagnosis of HTN, IDA, hyperthyroidism, and systolic heart failure.  She was admitted 12/22 with new acute heart failure, hypoxia requiring BiPap and hypertensive urgency. She was diuresed with IV Lasix and started on nitro gtt. Echo showed EF 40%, RV normal, mild MR and mild pericardial effusion. UDS + cocaine/THC. ECG with new LBBB. Underwent R/LHC showing no CAD and low filling pressures. She was started on GDMT. TSH low with high free T4 suggestive of hyperthyroidism, she was started on methimazole. Discharge weight 113 lbs.  Echo in 4/23 showed improved EF 14%, normal diastolic function, mild LVH, normal RV, small pericardial effusion, dilated IVC, PASP 35 mmHg.   4/23 labs showed TSH now elevated and free T4 low.  Methimazole was stopped.  She still has not seen endocrinology.   Patient returns for followup of CHF.  No ETOH or cocaine.  No significant exertional dyspnea.  No significant fatigue.  Taking all meds except out of [place].  No chest pain.  No lightheadedness.  No orthopnea/PND.   ECG (personally reviewed): NSR, anteroseptal Qs  Labs (1/23): K 3.1, creatinine 0.83 Labs (3/23): K 3.7, creatinine 1.23 Labs (4/23): BNP 18, K 4.6, creatinine 1.19, TSH 92, free T4 < 0.25  PMH: 1. H/o ETOH abuse 2. H/o cocaine abuse 3. Hyperthyroidism => hypothyroidism 4. HTN 5. Fe deficiency anemia 6. Chronic systolic CHF: Nonischemic cardiomyopathy.  - Echo (12/22): EF 40%, RV normal, mild MR and mild pericardial effusion - LHC/RHC (12/22): No significant CAD; mean RA 1, PA 24/5, mean PCWP 5, CI 3.8 - Echo (4/23): EF 43%, normal diastolic function, mild LVH, normal RV, small pericardial effusion, dilated IVC, PASP 35 mmHg.   Current Outpatient Medications  Medication Sig Dispense Refill    carvedilol (COREG) 6.25 MG tablet Take 1 tablet (6.25 mg total) by mouth 2 (two) times daily with a meal. 60 tablet 11   furosemide (LASIX) 20 MG tablet Take 1 tablet (20 mg total) by mouth daily. 90 tablet 3   potassium chloride (KLOR-CON) 10 MEQ tablet Take 1 tablet (10 mEq total) by mouth daily. 90 tablet 3   sacubitril-valsartan (ENTRESTO) 97-103 MG Take 1 tablet by mouth 2 (two) times daily. 60 tablet 4   spironolactone (ALDACTONE) 25 MG tablet Take 1 tablet (25 mg total) by mouth daily. 30 tablet 11   dapagliflozin propanediol (FARXIGA) 10 MG TABS tablet Take 1 tablet (10 mg total) by mouth daily. 30 tablet 11   No current facility-administered medications for this encounter.   No Known Allergies  Social History   Socioeconomic History   Marital status: Single    Spouse name: Not on file   Number of children: Not on file   Years of education: Not on file   Highest education level: Not on file  Occupational History   Occupation: unemployed  Tobacco Use   Smoking status: Every Day    Packs/day: 0.25    Years: 5.00    Total pack years: 1.25    Types: Cigarettes   Smokeless tobacco: Never  Substance and Sexual Activity   Alcohol use: Yes    Alcohol/week: 1.0 standard drink of alcohol    Types: 1 Glasses of wine per week    Comment: occaisionally; once every two weeks    Drug use: Yes  Types: Marijuana, Cocaine    Comment: pt reports smoking everyday   Sexual activity: Yes    Birth control/protection: None  Other Topics Concern   Not on file  Social History Narrative   Not on file   Social Determinants of Health   Financial Resource Strain: Not on file  Food Insecurity: Not on file  Transportation Needs: Not on file  Physical Activity: Not on file  Stress: Not on file  Social Connections: Not on file  Intimate Partner Violence: Not on file   Family History  Problem Relation Age of Onset   Hypertension Mother    BP 94/60   Pulse 89   Wt 59.6 kg (131 lb  6.4 oz)   SpO2 98%   BMI 19.98 kg/m   Wt Readings from Last 3 Encounters:  06/24/22 59.6 kg (131 lb 6.4 oz)  02/16/22 60.1 kg (132 lb 6.4 oz)  01/06/22 60.6 kg (133 lb 9.6 oz)   PHYSICAL EXAM: General: NAD Neck: No JVD, + thyromegaly.  Lungs: Clear to auscultation bilaterally with normal respiratory effort. CV: Nondisplaced PMI.  Heart regular S1/S2, no S3/S4, no murmur.  No peripheral edema.  No carotid bruit.  Normal pedal pulses.  Abdomen: Soft, nontender, no hepatosplenomegaly, no distention.  Skin: Intact without lesions or rashes.  Neurologic: Alert and oriented x 3.  Psych: Normal affect. Extremities: No clubbing or cyanosis.  HEENT: Normal.   ASSESSMENT & PLAN: 1. Chronic systolic CHF: Nonischemic cardiomyopathy.  Echo 1/23 EF 40-45% with inferior/inferoseptal HK, low normal RV function, mild-moderate MR, elevated AoV gradient at 11 mmHg but valve opens normally, dilated IVC, pleural effusion.  She has history of cocaine and ETOH abuse. Also h/o uncontrolled HTN.  No FH of CMP that she knows of.  She was found to have hyperthyroidism which may play a role in both cardiomyopathy and HTN.  Cath in 12/22 showed low filling pressures and no CAD.  Echo in 4/23 showed recovery of EF to 55% with normal diastolic function and normal RV.  Currently NYHA class I-II, no volume overload on exam.   - She can continue Lasix 20 mg daily.  - Continue Entresto 97/103 mg bid.  - Refill Farxiga 10 mg daily. - Continue spironolactone 25 mg daily.  - With soft BP and recovered EF, will stop hydralazine 25 mg tid and Imdur 30 mg daily. - Continue Coreg 6.25 mg bid.  - Continue to avoid cocaine and ETOH.  2. HTN: Long-standing.  Suspect hyperthyroidism played a role. Renal artery dopplers did not show FMD.  Renin/aldosterone levels unrevealing.  BP now low.  3. Fe deficiency anemia: Has been treated with feraheme.  4. Hyperthyroidism => hypothyroidism: TSH markedly low during 12/22 admit, also with  tachycardia/weight loss/cardiomyopathy.  Thyroid US c/w thyroiditis. Patient started on methimazole.  4/23 thyroid indices showed elevated TSH and low free T4.  Methimazole stopped.  - Need to recheck TSH, free T3, free T4 today.  - Still needs an appointment with endocrinology, will refer again.     Followup 6 months with APP.   Loralie Champagne 06/25/2022

## 2022-08-03 ENCOUNTER — Other Ambulatory Visit (HOSPITAL_COMMUNITY): Payer: Self-pay | Admitting: Family Medicine

## 2022-08-03 ENCOUNTER — Other Ambulatory Visit (HOSPITAL_COMMUNITY): Payer: Self-pay

## 2022-08-05 ENCOUNTER — Other Ambulatory Visit (HOSPITAL_COMMUNITY): Payer: Self-pay

## 2022-08-05 MED ORDER — ENTRESTO 97-103 MG PO TABS: 1.0000 | ORAL_TABLET | Freq: Two times a day (BID) | ORAL | 3 refills | Status: DC

## 2022-08-06 ENCOUNTER — Telehealth (HOSPITAL_COMMUNITY): Payer: Self-pay | Admitting: Pharmacy Technician

## 2022-08-06 ENCOUNTER — Other Ambulatory Visit (HOSPITAL_COMMUNITY): Payer: Self-pay

## 2022-08-06 NOTE — Telephone Encounter (Signed)
Advanced Heart Failure Patient Advocate Encounter  Patient called and left vm stating that she could not afford Entresto. Called and spoke with the patient. Emailed co-pay card information that was attached to the RX that was sent on 10/11. Also sent link for [place] co-pay card sign up.   Advised the patient to call back with issues.  Charlann Boxer, CPhT

## 2022-10-29 ENCOUNTER — Other Ambulatory Visit (HOSPITAL_COMMUNITY): Payer: Self-pay | Admitting: *Deleted

## 2022-10-29 MED ORDER — ENTRESTO 97-103 MG PO TABS: 1.0000 | ORAL_TABLET | Freq: Two times a day (BID) | ORAL | 3 refills | Status: DC

## 2022-11-03 ENCOUNTER — Other Ambulatory Visit (HOSPITAL_COMMUNITY): Payer: Self-pay

## 2022-11-03 MED ORDER — ENTRESTO 97-103 MG PO TABS: 1.0000 | ORAL_TABLET | Freq: Two times a day (BID) | ORAL | 0 refills | Status: DC

## 2022-11-05 ENCOUNTER — Telehealth: Payer: Self-pay | Admitting: Cardiology

## 2022-11-05 NOTE — Telephone Encounter (Signed)
Pt went to pick up her entresto and it was $1900 - I do not have access to samples.  I asked pt to ask pharmacy if she could have one to get her through the night until she could discuss with AHF on 11/06/22.  She would try.    AHF can you help her with her meds please?  Thank you

## 2022-11-05 NOTE — Telephone Encounter (Signed)
This seems pretty excessive, can someone please help?

## 2022-11-06 ENCOUNTER — Other Ambulatory Visit (HOSPITAL_COMMUNITY): Payer: Self-pay

## 2022-11-06 ENCOUNTER — Telehealth (HOSPITAL_COMMUNITY): Payer: Self-pay | Admitting: Pharmacy Technician

## 2022-11-06 NOTE — Telephone Encounter (Signed)
Hey, the pharmacy just needs to add the copay card to the prescription. I ran a test claim and it went through fine. I left the patient a vm on what to do. Thanks

## 2022-11-06 NOTE — Telephone Encounter (Signed)
Advanced Heart Failure Patient Advocate Encounter  Patient called stating she could not afford Entresto. Looks like pharmacy forgot to bill previously used co-pay card. Patient called back. I forwarded her the email that was sent last year with the necessary billing information on it.  Charlann Boxer, CPhT

## 2022-11-23 ENCOUNTER — Emergency Department (HOSPITAL_COMMUNITY): Payer: Commercial Managed Care - HMO

## 2022-11-23 ENCOUNTER — Emergency Department (HOSPITAL_COMMUNITY)
Admission: EM | Admit: 2022-11-23 | Discharge: 2022-11-23 | Disposition: A | Discharge status: Home / Self Care | Payer: Commercial Managed Care - HMO | Attending: Emergency Medicine | Admitting: Emergency Medicine

## 2022-11-23 ENCOUNTER — Encounter (HOSPITAL_COMMUNITY): Payer: Self-pay | Admitting: Emergency Medicine

## 2022-11-23 ENCOUNTER — Other Ambulatory Visit: Payer: Self-pay

## 2022-11-23 DIAGNOSIS — F17210 Nicotine dependence, cigarettes, uncomplicated: Secondary | ICD-10-CM | POA: Diagnosis not present

## 2022-11-23 DIAGNOSIS — J039 Acute tonsillitis, unspecified: Secondary | ICD-10-CM | POA: Diagnosis not present

## 2022-11-23 DIAGNOSIS — I5020 Unspecified systolic (congestive) heart failure: Secondary | ICD-10-CM | POA: Insufficient documentation

## 2022-11-23 DIAGNOSIS — J0191 Acute recurrent sinusitis, unspecified: Secondary | ICD-10-CM | POA: Diagnosis not present

## 2022-11-23 DIAGNOSIS — Z1152 Encounter for screening for COVID-19: Secondary | ICD-10-CM | POA: Diagnosis not present

## 2022-11-23 DIAGNOSIS — J029 Acute pharyngitis, unspecified: Secondary | ICD-10-CM | POA: Diagnosis present

## 2022-11-23 LAB — CBC
HCT: 31.7 % — ABNORMAL LOW (ref 36.0–46.0)
Hemoglobin: 10.3 g/dL — ABNORMAL LOW (ref 12.0–15.0)
MCH: 32.1 pg (ref 26.0–34.0)
MCHC: 32.5 g/dL (ref 30.0–36.0)
MCV: 98.8 fL (ref 80.0–100.0)
Platelets: 396 10*3/uL (ref 150–400)
RBC: 3.21 MIL/uL — ABNORMAL LOW (ref 3.87–5.11)
RDW: 14.7 % (ref 11.5–15.5)
WBC: 7.6 10*3/uL (ref 4.0–10.5)
nRBC: 0 % (ref 0.0–0.2)

## 2022-11-23 LAB — BASIC METABOLIC PANEL
Anion gap: 11 (ref 5–15)
BUN: 24 mg/dL — ABNORMAL HIGH (ref 6–20)
CO2: 20 mmol/L — ABNORMAL LOW (ref 22–32)
Calcium: 9.4 mg/dL (ref 8.9–10.3)
Chloride: 108 mmol/L (ref 98–111)
Creatinine, Ser: 0.91 mg/dL (ref 0.44–1.00)
GFR, Estimated: >60 mL/min (ref >60)
Glucose, Bld: 78 mg/dL (ref 70–99)
Potassium: 4.5 mmol/L (ref 3.5–5.1)
Sodium: 139 mmol/L (ref 135–145)

## 2022-11-23 LAB — RESP PANEL BY RT-PCR (RSV, FLU A&B, COVID)  RVPGX2
Influenza A by PCR: NEGATIVE
Influenza B by PCR: NEGATIVE
Resp Syncytial Virus by PCR: NEGATIVE
SARS Coronavirus 2 by RT PCR: NEGATIVE

## 2022-11-23 LAB — GROUP A STREP BY PCR: Group A Strep by PCR: NOT DETECTED

## 2022-11-23 LAB — TSH: TSH: 1.155 u[IU]/mL (ref 0.350–4.500)

## 2022-11-23 MED ORDER — AMOXICILLIN-POT CLAVULANATE 875-125 MG PO TABS: 1.0000 | ORAL_TABLET | Freq: Two times a day (BID) | ORAL | 0 refills | Status: AC

## 2022-11-23 MED ORDER — HYDROCODONE-ACETAMINOPHEN 5-325 MG PO TABS
1.0000 | ORAL_TABLET | Freq: Once | ORAL | Status: AC
  Administered 2022-11-23: 1 via ORAL
  Filled 2022-11-23: qty 1

## 2022-11-23 MED ORDER — IOHEXOL 350 MG/ML SOLN
75.0000 mL | Freq: Once | INTRAVENOUS | Status: AC | PRN
  Administered 2022-11-23: 75 mL via INTRAVENOUS

## 2022-11-23 NOTE — Discharge Instructions (Addendum)
We evaluated you for your sore throat and runny nose.  Your symptoms are likely due to a sinus infection.  You also have a hole leading from your sinuses into your mouth.  Please follow-up with ear nose and throat.  Please call the attached number for Dr. Fredric Dine for follow-up.

## 2022-11-23 NOTE — ED Provider Triage Note (Signed)
Emergency Medicine Provider Triage Evaluation Note  Judith Lowe , a 42 y.o. female  was evaluated in triage.  Pt complains of sore throat.  She has a goiter and has been unable to follow-up with endocrinology because they do not have an appointment until March since August of last year.  She states she is having trouble swallowing and has pain in her throat that feels like she is swallowing meals.  She states she is hungry and thirsty because she is having difficulty swallowing..  Review of Systems  Positive: Sore throat Negative: Fever  Physical Exam  BP 121/81 (BP Location: Right Arm)   Pulse 88   Temp 97.9 F (36.6 C) (Oral)   Resp 17   SpO2 100%  Gen:   Awake, no distress   Resp:  Normal effort  MSK:   Moves extremities without difficulty  Other:  Large goiter, erythema of the pharynx, hole in her palate  Medical Decision Making  Medically screening exam initiated at 10:14 AM.  Appropriate orders placed.  Rishita Petron was informed that the remainder of the evaluation will be completed by another provider, this initial triage assessment does not replace that evaluation, and the importance of remaining in the ED until their evaluation is complete.  Work up started    [employer], PA-C 11/23/22 1017

## 2022-11-23 NOTE — ED Triage Notes (Signed)
Pt reports sore throat since Tuesday. Denies fevers.

## 2022-11-24 NOTE — ED Provider Notes (Signed)
[facility] Provider Note  CSN: [id number] Arrival date & time: 11/23/22 1245  Chief Complaint(s) Sore Throat  HPI Judith Lowe is a 42 y.o. female history of CHF, polysubstance abuse presenting to the emergency department with sore throat.  She reports sore throat for the past few days.  She reports associated runny nose and congestion.  She reports that is worse with swallowing.  She is still able to swallow.  No nausea or vomiting.  No fevers or chills.  No cough.  No chest pain or shortness of breath.   Past Medical History Past Medical History:  Diagnosis Date   Drug use    ETOH abuse    Patient Active Problem List   Diagnosis Date Noted   Acute on chronic congestive heart failure ([place])    Acute systolic CHF (congestive heart failure) ([place]) 10/07/2021   Hyperthyroidism 10/07/2021   Acute CHF ([place]) 10/06/2021   Polysubstance abuse ([place]) 10/06/2021   Iron deficiency anemia 10/06/2021   Sad mood 09/07/2019   Secondary amenorrhea 09/07/2019   Pelvic pain 09/07/2019   Home Medication(s) Prior to Admission medications   Medication Sig Start Date End Date Taking? Authorizing Provider  amoxicillin-clavulanate (AUGMENTIN) 875-125 MG tablet Take 1 tablet by mouth every 12 (twelve) hours for 10 days. 11/23/22 12/03/22 Yes Cristie Hem, MD  carvedilol (COREG) 6.25 MG tablet Take 1 tablet (6.25 mg total) by mouth 2 (two) times daily with a meal. 11/21/21   [place], Maricela Bo, FNP  dapagliflozin propanediol (FARXIGA) 10 MG TABS tablet Take 1 tablet (10 mg total) by mouth daily. 06/24/22   Larey Dresser, MD  furosemide (LASIX) 20 MG tablet Take 1 tablet (20 mg total) by mouth daily. 02/16/22   Larey Dresser, MD  potassium chloride (KLOR-CON) 10 MEQ tablet Take 1 tablet (10 mEq total) by mouth daily. 02/16/22   Larey Dresser, MD  sacubitril-valsartan (ENTRESTO) 97-103 MG Take 1 tablet by mouth 2 (two) times daily. 11/03/22   Larey Dresser, MD  spironolactone (ALDACTONE) 25 MG tablet Take 1 tablet (25 mg total) by mouth daily. 11/21/21   Rafael Bihari, FNP                                                                                                                                    Past Surgical History Past Surgical History:  Procedure Laterality Date   NO PAST SURGERIES     RIGHT/LEFT HEART CATH AND CORONARY ANGIOGRAPHY N/A 10/08/2021   Procedure: RIGHT/LEFT HEART CATH AND CORONARY ANGIOGRAPHY;  Surgeon: Larey Dresser, MD;  Location: [facility];  Service: Cardiovascular;  Laterality: N/A;   Family History Family History  Problem Relation Age of Onset   Hypertension Mother     Social History Social History   Tobacco Use   Smoking status: Every Day    Packs/day: 0.25  Years: 5.00    Total pack years: 1.25    Types: Cigarettes   Smokeless tobacco: Never  Substance Use Topics   Alcohol use: Yes    Alcohol/week: 1.0 standard drink of alcohol    Types: 1 Glasses of wine per week    Comment: occaisionally; once every two weeks    Drug use: Yes    Types: Marijuana, Cocaine    Comment: pt reports smoking everyday   Allergies Patient has no known allergies.  Review of Systems Review of Systems  All other systems reviewed and are negative.   Physical Exam Vital Signs  I have reviewed the triage vital signs BP 120/72 (BP Location: Right Arm)   Pulse 85   Temp 98.1 F (36.7 C) (Oral)   Resp 16   SpO2 100%  Physical Exam Vitals and nursing note reviewed.  Constitutional:      General: She is not in acute distress.    Appearance: She is well-developed.  HENT:     Head: Normocephalic and atraumatic.     Comments: Hoarse voice.  Perforated nasal septum.  Hole in the soft palate reaching to the nasopharynx draining purulent discharge, posterior pharyngeal erythema, midline uvula, floor of mouth soft, Eyes:     Pupils: Pupils are equal, round, and reactive to light.  Cardiovascular:      Rate and Rhythm: Normal rate and regular rhythm.     Heart sounds: No murmur heard. Pulmonary:     Effort: Pulmonary effort is normal. No respiratory distress.     Breath sounds: Normal breath sounds.  Abdominal:     General: Abdomen is flat.     Palpations: Abdomen is soft.     Tenderness: There is no abdominal tenderness.  Musculoskeletal:        General: No tenderness.     Right lower leg: No edema.     Left lower leg: No edema.  Skin:    General: Skin is warm and dry.  Neurological:     General: No focal deficit present.     Mental Status: She is alert. Mental status is at baseline.  Psychiatric:        Mood and Affect: Mood normal.        Behavior: Behavior normal.     ED Results and Treatments Labs (all labs ordered are listed, but only abnormal results are displayed) Labs Reviewed  CBC - Abnormal; Notable for the following components:      Result Value   RBC 3.21 (*)    Hemoglobin 10.3 (*)    HCT 31.7 (*)    All other components within normal limits  BASIC METABOLIC PANEL - Abnormal; Notable for the following components:   CO2 20 (*)    BUN 24 (*)    All other components within normal limits  GROUP A STREP BY PCR  RESP PANEL BY RT-PCR (RSV, FLU A&B, COVID)  RVPGX2  TSH  Radiology No results found.  Pertinent labs & imaging results that were available during my care of the patient were reviewed by me and considered in my medical decision making (see MDM for details).  Medications Ordered in ED Medications  HYDROcodone-acetaminophen (NORCO/VICODIN) 5-325 MG per tablet 1 tablet (1 tablet Oral Given 11/23/22 1019)  iohexol (OMNIPAQUE) 350 MG/ML injection 75 mL (75 mLs Intravenous Contrast Given 11/23/22 1535)                                                                                                                                      Procedures Procedures  (including critical care time)  Medical Decision Making / ED Course   MDM:  42 year old female presenting to the emergency department sore throat.  Patient well-appearing, no fevers.  Vitals reassuring.  Physical exam notable for perforated septum and hole in the soft palate.  Patient does report history of extensive cocaine use.  Suspect this is the cause of her anatomic abnormalities.  CT scan shows significant sinus disease.  Sore throat likely related to postnasal drip versus tonsillitis as this is also noted on CT scan although her strep test is negative.  No evidence of Ludwig angina or deep space neck infection on CT scan.  Given nasopharyngeal abnormalities advise close follow-up with ear nose and throat. Patient denies ongoing cocaine use.  Will treat for sinusitis.  No evidence of necrotizing infection. Will discharge patient to home. All questions answered. Patient comfortable with plan of discharge. Return precautions discussed with patient and specified on the after visit summary.        Lab Tests: -I ordered, reviewed, and interpreted labs.   The pertinent results include:   Labs Reviewed  CBC - Abnormal; Notable for the following components:      Result Value   RBC 3.21 (*)    Hemoglobin 10.3 (*)    HCT 31.7 (*)    All other components within normal limits  BASIC METABOLIC PANEL - Abnormal; Notable for the following components:   CO2 20 (*)    BUN 24 (*)    All other components within normal limits  GROUP A STREP BY PCR  RESP PANEL BY RT-PCR (RSV, FLU A&B, COVID)  RVPGX2  TSH    Notable for no leukocytosis     Imaging Studies ordered: I ordered imaging studies including CT neck On my interpretation imaging demonstrates sinus disease and tonsillitis I independently visualized and interpreted imaging. I agree with the radiologist interpretation   Medicines ordered and prescription drug management: Meds ordered this encounter   Medications   HYDROcodone-acetaminophen (NORCO/VICODIN) 5-325 MG per tablet 1 tablet   iohexol (OMNIPAQUE) 350 MG/ML injection 75 mL   amoxicillin-clavulanate (AUGMENTIN) 875-125 MG tablet    Sig: Take 1 tablet by mouth every 12 (twelve) hours for 10 days.    Dispense:  20 tablet    Refill:  0    -  I have reviewed the patients home medicines and have made adjustments as needed   Social Determinants of Health:  Diagnosis or treatment significantly limited by social determinants of health: polysubstance abuse   Reevaluation: After the interventions noted above, I reevaluated the patient and found that they have improved  Co morbidities that complicate the patient evaluation  Past Medical History:  Diagnosis Date   Drug use    ETOH abuse       Dispostion: Disposition decision including need for hospitalization was considered, and patient discharged from emergency department.    Final Clinical Impression(s) / ED Diagnoses Final diagnoses:  Acute recurrent sinusitis, unspecified location  Tonsillitis     This chart was dictated using voice recognition software.  Despite best efforts to proofread,  errors can occur which can change the documentation meaning.    Cristie Hem, MD 11/24/22 [phone]

## 2022-12-04 ENCOUNTER — Ambulatory Visit (HOSPITAL_COMMUNITY)
Admission: RE | Admit: 2022-12-04 | Discharge: 2022-12-04 | Disposition: A | Discharge status: Home / Self Care | Payer: Commercial Managed Care - HMO | Source: Ambulatory Visit | Attending: Cardiology | Admitting: Cardiology

## 2022-12-04 ENCOUNTER — Encounter (HOSPITAL_COMMUNITY): Payer: Self-pay | Admitting: Cardiology

## 2022-12-04 VITALS — BP 90/60 | HR 78 | Wt 129.6 lb

## 2022-12-04 DIAGNOSIS — D509 Iron deficiency anemia, unspecified: Secondary | ICD-10-CM | POA: Insufficient documentation

## 2022-12-04 DIAGNOSIS — F1491 Cocaine use, unspecified, in remission: Secondary | ICD-10-CM | POA: Diagnosis not present

## 2022-12-04 DIAGNOSIS — I428 Other cardiomyopathies: Secondary | ICD-10-CM | POA: Insufficient documentation

## 2022-12-04 DIAGNOSIS — Z79899 Other long term (current) drug therapy: Secondary | ICD-10-CM | POA: Diagnosis not present

## 2022-12-04 DIAGNOSIS — I110 Hypertensive heart disease with heart failure: Secondary | ICD-10-CM | POA: Diagnosis not present

## 2022-12-04 DIAGNOSIS — Z7984 Long term (current) use of oral hypoglycemic drugs: Secondary | ICD-10-CM | POA: Diagnosis not present

## 2022-12-04 DIAGNOSIS — I5022 Chronic systolic (congestive) heart failure: Secondary | ICD-10-CM | POA: Diagnosis present

## 2022-12-04 DIAGNOSIS — F1011 Alcohol abuse, in remission: Secondary | ICD-10-CM | POA: Diagnosis not present

## 2022-12-04 DIAGNOSIS — E039 Hypothyroidism, unspecified: Secondary | ICD-10-CM | POA: Insufficient documentation

## 2022-12-04 DIAGNOSIS — F17210 Nicotine dependence, cigarettes, uncomplicated: Secondary | ICD-10-CM | POA: Diagnosis not present

## 2022-12-04 LAB — BASIC METABOLIC PANEL
Anion gap: 8 (ref 5–15)
BUN: 13 mg/dL (ref 6–20)
CO2: 27 mmol/L (ref 22–32)
Calcium: 8.4 mg/dL — ABNORMAL LOW (ref 8.9–10.3)
Chloride: 101 mmol/L (ref 98–111)
Creatinine, Ser: 0.75 mg/dL (ref 0.44–1.00)
GFR, Estimated: >60 mL/min (ref >60)
Glucose, Bld: 115 mg/dL — ABNORMAL HIGH (ref 70–99)
Potassium: 3.5 mmol/L (ref 3.5–5.1)
Sodium: 136 mmol/L (ref 135–145)

## 2022-12-04 LAB — T4, FREE: Free T4: 0.9 ng/dL (ref 0.61–1.12)

## 2022-12-04 LAB — TSH: TSH: 0.241 u[IU]/mL — ABNORMAL LOW (ref 0.350–4.500)

## 2022-12-04 MED ORDER — ENTRESTO 49-51 MG PO TABS: 1.0000 | ORAL_TABLET | Freq: Two times a day (BID) | ORAL | 3 refills | Status: DC

## 2022-12-04 NOTE — Patient Instructions (Addendum)
STOP Lasix.  DECREASE Entresto to 49/51 mg Twice daily  Labs done today, your results will be available in MyChart, we will contact you for abnormal readings.  Your physician has requested that you have an echocardiogram. Echocardiography is a painless test that uses sound waves to create images of your heart. It provides your doctor with information about the size and shape of your heart and how well your heart's chambers and valves are working. This procedure takes approximately one hour. There are no restrictions for this procedure. Please do NOT wear cologne, perfume, aftershave, or lotions (deodorant is allowed). Please arrive 15 minutes prior to your appointment time.  Your physician recommends that you schedule a follow-up appointment in: 1 year ( February 2025)  ** please call the office in November 2024 to arrange your follow up appointment. **  If you have any questions or concerns before your next appointment please send [place] a message through [place] or call our office at [phone].    TO LEAVE A MESSAGE FOR THE NURSE SELECT OPTION 2, PLEASE LEAVE A MESSAGE INCLUDING: YOUR NAME DATE OF BIRTH CALL BACK NUMBER REASON FOR CALL**this is important as we prioritize the call backs  YOU WILL RECEIVE A CALL BACK THE SAME DAY AS LONG AS YOU CALL BEFORE 4:00 PM  At the [facility], you and your health needs are our priority. As part of our continuing mission to provide you with exceptional heart care, we have created designated Provider Care Teams. These Care Teams include your primary Cardiologist (physician) and Advanced Practice Providers (APPs- Physician Assistants and Nurse Practitioners) who all work together to provide you with the care you need, when you need it.   You may see any of the following providers on your designated Care Team at your next follow up: Dr Glori Bickers Dr Loralie Champagne Dr. Roxana Hires, NP Lyda Jester, [place] [facility] [place], [place] Forestine Na, NP Audry Riles, PharmD   Please be sure to bring in all your medications bottles to every appointment.    Thank you for choosing [facility]

## 2022-12-04 NOTE — Progress Notes (Signed)
ADVANCED HF CLINIC NOTE   Primary Care: Charlott Rakes, MD HF Cardiologist: Dr. Aundra Dubin  HPI: Judith Lowe is a 42 y.o. female with history of ETOH and substance abuse (cocaine), tobacco user and new diagnosis of HTN, IDA, hyperthyroidism, and systolic heart failure.  She was admitted 12/22 with new acute heart failure, hypoxia requiring BiPap and hypertensive urgency. She was diuresed with IV Lasix and started on nitro gtt. Echo showed EF 40%, RV normal, mild MR and mild pericardial effusion. UDS + cocaine/THC. ECG with new LBBB. Underwent R/LHC showing no CAD and low filling pressures. She was started on GDMT. TSH low with high free T4 suggestive of hyperthyroidism, she was started on methimazole. Discharge weight 113 lbs.  Echo in 4/23 showed improved EF [id number], normal diastolic function, mild LVH, normal RV, small pericardial effusion, dilated IVC, PASP 35 mmHg.   4/23 labs showed TSH now elevated and free T4 low.  Methimazole was stopped.  8/23 labs showed normal TSH and free T3 with mildly decreased free T4.    Patient returns for followup of CHF.  No ETOH or cocaine, no smoking.  She has felt well with no exertional dyspnea or chest pain.  BP now runs a bit low and she occasionally has some lightheadedness with standing.    Labs (1/23): K 3.1, creatinine 0.83 Labs (3/23): K 3.7, creatinine 1.23 Labs (4/23): BNP 18, K 4.6, creatinine 1.19, TSH 92, free T4 < 0.25 Labs (8/23): TSH normal, free T3 normal, free T4 mildly decreased.  Labs (1/24): K 4.5, creatinine 0.91, TSH normal  PMH: 1. H/o ETOH abuse 2. H/o cocaine abuse 3. Hyperthyroidism => hypothyroidism 4. HTN 5. Fe deficiency anemia 6. Chronic systolic CHF: Nonischemic cardiomyopathy.  - Echo (12/22): EF 40%, RV normal, mild MR and mild pericardial effusion - LHC/RHC (12/22): No significant CAD; mean RA 1, PA 24/5, mean PCWP 5, CI 3.8 - Echo (4/23): EF [id number], normal diastolic function, mild LVH, normal RV, small pericardial  effusion, dilated IVC, PASP 35 mmHg.   Current Outpatient Medications  Medication Sig Dispense Refill   carvedilol (COREG) 6.25 MG tablet Take 1 tablet (6.25 mg total) by mouth 2 (two) times daily with a meal. 60 tablet 11   dapagliflozin propanediol (FARXIGA) 10 MG TABS tablet Take 1 tablet (10 mg total) by mouth daily. 30 tablet 11   furosemide (LASIX) 20 MG tablet Take 1 tablet (20 mg total) by mouth daily. 90 tablet 3   potassium chloride (KLOR-CON) 10 MEQ tablet Take 1 tablet (10 mEq total) by mouth daily. 90 tablet 3   sacubitril-valsartan (ENTRESTO) 97-103 MG Take 1 tablet by mouth 2 (two) times daily. 180 tablet 0   spironolactone (ALDACTONE) 25 MG tablet Take 1 tablet (25 mg total) by mouth daily. 30 tablet 11   No current facility-administered medications for this encounter.   No Known Allergies  Social History   Socioeconomic History   Marital status: Single    Spouse name: Not on file   Number of children: Not on file   Years of education: Not on file   Highest education level: Not on file  Occupational History   Occupation: unemployed  Tobacco Use   Smoking status: Every Day    Packs/day: 0.25    Years: 5.00    Total pack years: 1.25    Types: Cigarettes   Smokeless tobacco: Never  Substance and Sexual Activity   Alcohol use: Yes    Alcohol/week: 1.0 standard drink of alcohol  Types: 1 Glasses of wine per week    Comment: occaisionally; once every two weeks    Drug use: Yes    Types: Marijuana, Cocaine    Comment: pt reports smoking everyday   Sexual activity: Yes    Birth control/protection: None  Other Topics Concern   Not on file  Social History Narrative   Not on file   Social Determinants of Health   Financial Resource Strain: Not on file  Food Insecurity: Not on file  Transportation Needs: Not on file  Physical Activity: Not on file  Stress: Not on file  Social Connections: Not on file  Intimate Partner Violence: Not on file   Family  History  Problem Relation Age of Onset   Hypertension Mother    BP 90/60   Pulse 78   Wt 58.8 kg (129 lb 9.6 oz)   SpO2 100%   BMI 19.71 kg/m   Wt Readings from Last 3 Encounters:  12/04/22 58.8 kg (129 lb 9.6 oz)  06/24/22 59.6 kg (131 lb 6.4 oz)  02/16/22 60.1 kg (132 lb 6.4 oz)   PHYSICAL EXAM: General: NAD Neck: No JVD, +thyromegaly or thyroid nodule.  Lungs: Clear to auscultation bilaterally with normal respiratory effort. CV: Nondisplaced PMI.  Heart regular S1/S2, no S3/S4, no murmur.  No peripheral edema.  No carotid bruit.  Normal pedal pulses.  Abdomen: Soft, nontender, no hepatosplenomegaly, no distention.  Skin: Intact without lesions or rashes.  Neurologic: Alert and oriented x 3.  Psych: Normal affect. Extremities: No clubbing or cyanosis.  HEENT: Normal.   ASSESSMENT & PLAN: 1. Chronic systolic CHF: Nonischemic cardiomyopathy.  Echo 1/23 EF 40-45% with inferior/inferoseptal HK, low normal RV function, mild-moderate MR, elevated AoV gradient at 11 mmHg but valve opens normally, dilated IVC, pleural effusion.  She has history of cocaine and ETOH abuse. Also h/o uncontrolled HTN.  No FH of CMP that she knows of.  She was found to have hyperthyroidism which may play a role in both cardiomyopathy and HTN.  Cath in 12/22 showed low filling pressures and no CAD.  Echo in 4/23 showed recovery of EF to 55% with normal diastolic function and normal RV.  Currently NYHA class I, not volume overloaded on exam.  BP is on the lower side.  - She can stop Lasix.  - Decrease Entresto to 49/51 bid.   - Continue Farxiga 10 mg daily. - Continue spironolactone 25 mg daily. BMET today.  - Continue Coreg 6.25 mg bid.  - Continue to avoid cocaine and ETOH.  - Repeat echo at 1 year in 4/24 to make sure EF remains normal.  2. HTN: Long-standing.  Suspect hyperthyroidism played a role. Renal artery dopplers did not show FMD.  Renin/aldosterone levels unrevealing.  BP now low.  3. Fe  deficiency anemia: Has been treated with feraheme.  4. Hyperthyroidism => hypothyroidism: TSH markedly low during 12/22 admit, also with tachycardia/weight loss/cardiomyopathy.  Thyroid US consistent with thyroiditis. Patient started on methimazole.  4/23 thyroid indices showed elevated TSH and low free T4.  Methimazole stopped.  TSH was normal in 1/24.  - Need to recheck TSH, free T3, free T4 today.  - Still needs an appointment with endocrinology, apparently will be seen in April.      Followup 3 months for BMET, can see APP 1 year if echo shows that EF remains normal.  Should get BMET q3 months.   Loralie Champagne 12/04/2022

## 2022-12-06 LAB — T3, FREE: T3, Free: 2.8 pg/mL (ref 2.0–4.4)

## 2022-12-07 ENCOUNTER — Other Ambulatory Visit (HOSPITAL_COMMUNITY): Payer: Self-pay

## 2022-12-14 ENCOUNTER — Other Ambulatory Visit (HOSPITAL_COMMUNITY): Payer: Self-pay

## 2022-12-17 ENCOUNTER — Other Ambulatory Visit (HOSPITAL_COMMUNITY): Payer: Self-pay

## 2022-12-17 ENCOUNTER — Telehealth (HOSPITAL_COMMUNITY): Payer: Self-pay

## 2022-12-17 NOTE — Telephone Encounter (Signed)
Advanced Heart Failure Patient Advocate Encounter  Patient is having issues processing medications. There are currently two insurances listed; the primary insurance Psychologist, counselling) is indicating non payment of premium, and the secondary plan (Medicaid) will not cover without primary coverage.  Spoke to patient by phone, she will reach out to previous employer to see if the [employer] coverage issues can be resolved. Updates to follow.  Clista Bernhardt, CPhT Rx Patient Advocate Phone: [phone]

## 2022-12-22 ENCOUNTER — Other Ambulatory Visit (HOSPITAL_COMMUNITY): Payer: Self-pay

## 2022-12-24 ENCOUNTER — Other Ambulatory Visit (HOSPITAL_COMMUNITY): Payer: Self-pay

## 2022-12-24 NOTE — Telephone Encounter (Signed)
Advanced Heart Failure Patient Advocate Encounter  Test claims are still returning a rejection to have patient contact insurance directly. Called patient to see if there has been any update, left voicemail.

## 2022-12-28 ENCOUNTER — Other Ambulatory Visit (HOSPITAL_COMMUNITY): Payer: Self-pay

## 2022-12-30 ENCOUNTER — Other Ambulatory Visit (HOSPITAL_COMMUNITY): Payer: Self-pay

## 2022-12-31 NOTE — Telephone Encounter (Signed)
Advanced Heart Failure Patient Advocate Encounter  Attempted to call patient to see if there is any update. Patient either needs to correct the issue with primary insurance (C[employer]a) or contact secondary insurance (Medicaid) to have the plan updated to be primary coverage.  No answer, left voicemail.

## 2023-01-04 ENCOUNTER — Other Ambulatory Visit (HOSPITAL_COMMUNITY): Payer: Self-pay

## 2023-01-05 ENCOUNTER — Other Ambulatory Visit (HOSPITAL_COMMUNITY): Payer: Self-pay

## 2023-01-05 NOTE — Telephone Encounter (Signed)
Advanced Heart Failure Patient Advocate Encounter  Third attempt to reach patient about current insurance coverage. No answer, left voicemail.

## 2023-01-11 ENCOUNTER — Other Ambulatory Visit (HOSPITAL_COMMUNITY): Payer: Self-pay

## 2023-01-13 ENCOUNTER — Other Ambulatory Visit (HOSPITAL_COMMUNITY): Payer: Self-pay

## 2023-01-13 NOTE — Telephone Encounter (Signed)
Advanced Heart Failure Patient Advocate Encounter  [employer] coverage has been canceled. Medicaid is now rejecting as 'submit to primary payer'. I have spoken with the patient and advised her to contact [phone] to put in an urgent request to have this resolved.  Test billing indicates prior authorizations may be needed for Judith Lowe. These are currently rejecting due to coverage code. Will resubmit once this is resolved.

## 2023-01-15 ENCOUNTER — Other Ambulatory Visit (HOSPITAL_COMMUNITY): Payer: Self-pay

## 2023-01-18 ENCOUNTER — Other Ambulatory Visit (HOSPITAL_COMMUNITY): Payer: Self-pay

## 2023-01-19 ENCOUNTER — Ambulatory Visit: Payer: Commercial Managed Care - HMO | Admitting: Nurse Practitioner

## 2023-01-21 ENCOUNTER — Other Ambulatory Visit (HOSPITAL_COMMUNITY): Payer: Self-pay

## 2023-01-27 ENCOUNTER — Ambulatory Visit: Payer: Commercial Managed Care - HMO | Admitting: Physician Assistant

## 2023-01-28 ENCOUNTER — Other Ambulatory Visit (HOSPITAL_COMMUNITY): Payer: Self-pay

## 2023-02-03 ENCOUNTER — Ambulatory Visit (HOSPITAL_COMMUNITY): Admission: RE | Admit: 2023-02-03 | Payer: Medicaid Other | Source: Ambulatory Visit

## 2023-02-03 ENCOUNTER — Other Ambulatory Visit (HOSPITAL_COMMUNITY): Payer: Self-pay

## 2023-02-03 DIAGNOSIS — I428 Other cardiomyopathies: Secondary | ICD-10-CM

## 2023-02-03 MED ORDER — SPIRONOLACTONE 25 MG PO TABS: 25.0000 mg | ORAL_TABLET | Freq: Every day | ORAL | 1 refills | Status: DC

## 2023-02-03 MED ORDER — DAPAGLIFLOZIN PROPANEDIOL 10 MG PO TABS: 10.0000 mg | ORAL_TABLET | Freq: Every day | ORAL | 1 refills | Status: AC

## 2023-02-03 MED ORDER — CARVEDILOL 6.25 MG PO TABS: 6.2500 mg | ORAL_TABLET | Freq: Two times a day (BID) | ORAL | 1 refills | Status: DC

## 2023-02-03 MED ORDER — POTASSIUM CHLORIDE ER 10 MEQ PO TBCR: 10.0000 meq | EXTENDED_RELEASE_TABLET | Freq: Every day | ORAL | 3 refills | Status: DC

## 2023-02-03 MED ORDER — ENTRESTO 49-51 MG PO TABS: 1.0000 | ORAL_TABLET | Freq: Two times a day (BID) | ORAL | 3 refills | Status: DC

## 2023-02-04 ENCOUNTER — Other Ambulatory Visit (HOSPITAL_COMMUNITY): Payer: Self-pay

## 2023-02-08 ENCOUNTER — Other Ambulatory Visit (HOSPITAL_COMMUNITY): Payer: Self-pay

## 2023-02-08 NOTE — Telephone Encounter (Signed)
Advanced Heart Failure Patient Advocate Encounter  Spoke with patient for update. New rxs for all meds were sent into preferred pharmacy last week, and I have been submitting PA requests for Entresto and Marcelline Deist and still receiving a primary payor rejection. Will continue to reprocess until an approval is obtained, and patient is aware.

## 2023-02-11 ENCOUNTER — Ambulatory Visit: Payer: Commercial Managed Care - HMO | Admitting: Physician Assistant

## 2023-02-12 NOTE — Progress Notes (Deleted)
Name: Judith Lowe  MRN/ DOB: [id number], [birth year]    Age/ Sex: 42 y.o., female    PCP: Hoy Register, MD   Reason for Endocrinology Evaluation: Hyperthyroidism     Date of Initial Endocrinology Evaluation: 02/12/2023     HPI: [name] is a 42 y.o. female with a past medical history of CHF, polysubstance abuse, hyperthyroid. The patient presented for initial endocrinology clinic visit on 02/12/2023 for consultative assistance with her Hyperthyroidism.   Patient has been noted with hyperthyroid since 09/2021 with a suppressed TSH at 0.01 u IU/mL with elevated free T4 at 4 NG/DL and T3 at 13.2 PG/mL  She has been on methimazole intermittently since 09/2021    Thyroid ultrasound did not reveal any nodules 09/2021  HISTORY:  Past Medical History:  Past Medical History:  Diagnosis Date   Drug use    ETOH abuse    Past Surgical History:  Past Surgical History:  Procedure Laterality Date   NO PAST SURGERIES     RIGHT/LEFT HEART CATH AND CORONARY ANGIOGRAPHY N/A 10/08/2021   Procedure: RIGHT/LEFT HEART CATH AND CORONARY ANGIOGRAPHY;  Surgeon: Laurey Morale, MD;  [facility];  Service: Cardiovascular;  Laterality: N/A;    Social History:  reports that she has been smoking cigarettes. She has a 1.25 pack-year smoking history. She has never used smokeless tobacco. She reports current alcohol use of about 1.0 standard drink of alcohol per week. She reports current drug use. Drugs: Marijuana and Cocaine. Family History: family history includes Hypertension in her mother.   HOME MEDICATIONS: Allergies as of 02/15/2023   No Known Allergies      Medication List        Accurate as of February 12, 2023  3:33 PM. If you have any questions, ask your nurse or doctor.          carvedilol 6.25 MG tablet Commonly known as: COREG Take 1 tablet (6.25 mg total) by mouth 2 (two) times daily with a meal.   dapagliflozin propanediol 10 MG Tabs tablet Commonly  known as: Farxiga Take 1 tablet (10 mg total) by mouth daily.   Entresto 49-51 MG Generic drug: sacubitril-valsartan Take 1 tablet by mouth 2 (two) times daily.   potassium chloride 10 MEQ tablet Commonly known as: KLOR-CON Take 1 tablet (10 mEq total) by mouth daily.   spironolactone 25 MG tablet Commonly known as: ALDACTONE Take 1 tablet (25 mg total) by mouth daily.          REVIEW OF SYSTEMS: A comprehensive ROS was conducted with the patient and is negative except as per HPI and below:  ROS     OBJECTIVE:  VS: There were no vitals taken for this visit.   Wt Readings from Last 3 Encounters:  12/04/22 129 lb 9.6 oz (58.8 kg)  06/24/22 131 lb 6.4 oz (59.6 kg)  02/16/22 132 lb 6.4 oz (60.1 kg)     EXAM: General: Pt appears well and is in NAD  Eyes: External eye exam normal without stare, lid lag or exophthalmos.  EOM intact.  PERRL.  Neck: General: Supple without adenopathy. Thyroid: Thyroid size normal.  No goiter or nodules appreciated. No thyroid bruit.  Lungs: Clear with good BS bilat with no rales, rhonchi, or wheezes  Heart: Auscultation: RRR.  Abdomen: Normoactive bowel sounds, soft, nontender, without masses or organomegaly palpable  Extremities:  BL LE: No pretibial edema normal ROM and strength.  Mental Status: Judgment, insight: Intact Orientation:  Oriented to time, place, and person Mood and affect: No depression, anxiety, or agitation     DATA REVIEWED: ***    ASSESSMENT/PLAN/RECOMMENDATIONS:   Hyperthyroidism:    Medications :  Signed electronically by: Lyndle Herrlich, MD  [facility] Endocrinology  [facility] [address], [address] [place], [place] [address] Phone: [phone] FAX: [phone]   CC: Hoy Register, MD [address] [place] [address] Phone: [phone] Fax: [phone]   Return to Endocrinology clinic as below: Future Appointments  Date Time Provider  Department Center  02/15/2023  8:30 AM Tearsa Kowalewski, Konrad Dolores, MD [facility] None  02/22/2023 10:10 AM [facility]  03/01/2023 10:00 AM [facility] LAB [facility] None

## 2023-02-15 ENCOUNTER — Other Ambulatory Visit (HOSPITAL_COMMUNITY): Payer: Self-pay

## 2023-02-15 ENCOUNTER — Ambulatory Visit: Payer: Medicaid Other | Admitting: Internal Medicine

## 2023-02-17 ENCOUNTER — Other Ambulatory Visit (HOSPITAL_COMMUNITY): Payer: Self-pay

## 2023-02-17 ENCOUNTER — Ambulatory Visit (INDEPENDENT_AMBULATORY_CARE_PROVIDER_SITE_OTHER): Payer: Medicaid Other | Admitting: Internal Medicine

## 2023-02-17 ENCOUNTER — Telehealth (HOSPITAL_COMMUNITY): Payer: Self-pay | Admitting: Pharmacy Technician

## 2023-02-17 ENCOUNTER — Encounter: Payer: Self-pay | Admitting: Internal Medicine

## 2023-02-17 VITALS — BP 108/72 | HR 80 | Ht 68.0 in | Wt 127.0 lb

## 2023-02-17 DIAGNOSIS — E0590 Thyrotoxicosis, unspecified without thyrotoxic crisis or storm: Secondary | ICD-10-CM

## 2023-02-17 LAB — TSH: TSH: 1.05 u[IU]/mL (ref 0.35–5.50)

## 2023-02-17 LAB — T4, FREE: Free T4: 0.73 ng/dL (ref 0.60–1.60)

## 2023-02-17 NOTE — Telephone Encounter (Signed)
Advanced Heart Failure Patient Advocate Encounter  Returned patient call about medications. Prior auth attempts are still returning with primary payer rejection. Representative stated that this should be resolved within the next 2-3 weeks. Will continue to follow up. Requesting samples be provided to patient in the meantime, as she is entirely out of [place] at this time.

## 2023-02-17 NOTE — Progress Notes (Unsigned)
Name: Judith Lowe  MRN/ DOB: [id number], 07-16-1981    Age/ Sex: 42 y.o., female    PCP: Hoy Register, MD   Reason for Endocrinology Evaluation: Hyperthyroidism     Date of Initial Endocrinology Evaluation: 02/17/2023     HPI: [name] is a 42 y.o. female with a past medical history of CHF, polysubstance abuse, hyperthyroid. The patient presented for initial endocrinology clinic visit on 02/17/2023 for consultative assistance with her Hyperthyroidism.   Patient has been noted with hyperthyroid since 09/2021 with a suppressed TSH at 0.01 u IU/mL with elevated free T4 at 4 NG/DL and T3 at 40.9 PG/mL  She has been on methimazole intermittently since 09/2021  Thyroid ultrasound did not reveal any nodules 09/2021   She is c/o dysphagia as well as odynophagia  Denies GERD Denies local neck swelling Has palpitations, tremors  Has thinning of the hair  Has difficulty gaining weight  She has  not been on methimazole for a while  Has mild eye symptoms with itching and burning   Mother and maternal aunts with thyroid nodules     HISTORY:  Past Medical History:  Past Medical History:  Diagnosis Date   Drug use    ETOH abuse    Past Surgical History:  Past Surgical History:  Procedure Laterality Date   NO PAST SURGERIES     RIGHT/LEFT HEART CATH AND CORONARY ANGIOGRAPHY N/A 10/08/2021   Procedure: RIGHT/LEFT HEART CATH AND CORONARY ANGIOGRAPHY;  Surgeon: Laurey Morale, MD;  [facility];  Service: Cardiovascular;  Laterality: N/A;    Social History:  reports that she has been smoking cigarettes. She has a 1.25 pack-year smoking history. She has never used smokeless tobacco. She reports current alcohol use of about 1.0 standard drink of alcohol per week. She reports current drug use. Drugs: Marijuana and Cocaine. Family History: family history includes Hypertension in her mother.   HOME MEDICATIONS: Allergies as of 02/17/2023   No Known Allergies       Medication List        Accurate as of February 17, 2023  1:30 PM. If you have any questions, ask your nurse or doctor.          carvedilol 6.25 MG tablet Commonly known as: COREG Take 1 tablet (6.25 mg total) by mouth 2 (two) times daily with a meal.   dapagliflozin propanediol 10 MG Tabs tablet Commonly known as: Farxiga Take 1 tablet (10 mg total) by mouth daily.   Entresto 49-51 MG Generic drug: sacubitril-valsartan Take 1 tablet by mouth 2 (two) times daily.   potassium chloride 10 MEQ tablet Commonly known as: KLOR-CON Take 1 tablet (10 mEq total) by mouth daily.   spironolactone 25 MG tablet Commonly known as: ALDACTONE Take 1 tablet (25 mg total) by mouth daily.          REVIEW OF SYSTEMS: A comprehensive ROS was conducted with the patient and is negative except as per HPI     OBJECTIVE:  VS: BP 108/72 (BP Location: Left Arm, Patient Position: Sitting, Cuff Size: Small)   Pulse 80   Ht  (1.727 m)   Wt 127 lb (57.6 kg)   SpO2 99%   BMI 19.31 kg/m    Wt Readings from Last 3 Encounters:  02/17/23 127 lb (57.6 kg)  12/04/22 129 lb 9.6 oz (58.8 kg)  06/24/22 131 lb 6.4 oz (59.6 kg)     EXAM: General: Pt appears well and  is in NAD  Eyes: External eye exam normal without stare, lid lag or exophthalmos.  EOM intact.    Neck: General: Supple without adenopathy. Thyroid: Thyroid ~ 50 grams.   No  nodules appreciated. No thyroid bruit.  Lungs: Clear with good BS bilat   Heart: Auscultation: RRR.  Abdomen: soft, nontender  Extremities:  BL LE: No pretibial edema normal   Mental Status: Judgment, insight: Intact Orientation: Oriented to time, place, and person Mood and affect: No depression, anxiety, or agitation     DATA REVIEWED:     Latest Reference Range & Units 02/17/23 13:44  TSH 0.35 - 5.50 uIU/mL 1.05  T4,Free(Direct) 0.60 - 1.60 ng/dL 1.61    ASSESSMENT/PLAN/RECOMMENDATIONS:   Hyperthyroidism:  - Pt with  hyperthyroidism - Discussed pathophysiology of hyperthyroidism and its cardiac impact.  We discussed the importance of keeping euthyroid status -She has not been on methimazole for a while -Discussed the importance of taking it on regular basis -High suspicion for Graves' disease, TRAb pending     Follow-up in 4 months Labs in 2 months  Signed electronically by: Lyndle Herrlich, MD  [facility] Endocrinology  [facility] [address] [place] [place], [place] [address] Phone: [phone] FAX: [phone]   CC: Hoy Register, MD [address] [place] [address] Phone: [phone] Fax: [phone]   Return to Endocrinology clinic as below: Future Appointments  Date Time Provider Department Center  02/22/2023 10:10 AM [facility] ECHO[facility] OP [facility]  03/01/2023 10:00 AM [facility] LAB [facility] None

## 2023-02-17 NOTE — Telephone Encounter (Signed)
Advanced Heart Failure Patient Advocate Encounter  Medication Samples have been provided to the patient.  Drug name: Marcelline Deist       Strength:         Qty: 4 boxes  LOT: [id number]  Exp.Date: 08/25/2025  Dosing instructions: Take 1 tablet daily.  The patient has been instructed regarding the correct time, dose, and frequency of taking this medication, including desired effects and most common side effects.   Allen Kell Charmian Forbis 2:15 PM 02/17/2023  Medication Samples have been provided to the patient.  Drug name: Sherryll Burger       Strength: 49-51mg         Qty: 2 bottles  LOT: [id number]  Exp.Date: 11/25  Dosing instructions: Take 1 tablet twice daily.  The patient has been instructed regarding the correct time, dose, and frequency of taking this medication, including desired effects and most common side effects.   Allen Kell Castulo Scarpelli 2:16 PM 02/17/2023

## 2023-02-18 ENCOUNTER — Telehealth: Payer: Self-pay | Admitting: Internal Medicine

## 2023-02-18 NOTE — Telephone Encounter (Signed)
LMTCB

## 2023-02-18 NOTE — Telephone Encounter (Signed)
Please let the patient know that her thyroid function have normalized, which is great news.  At this time she is in remission   No need to treat at this time, but we will continue to monitor

## 2023-02-19 LAB — TRAB (TSH RECEPTOR BINDING ANTIBODY): TRAB: <1 IU/L (ref <=2.00)

## 2023-02-19 LAB — T3: T3, Total: 104 ng/dL (ref 76–181)

## 2023-02-19 NOTE — Telephone Encounter (Signed)
Pt has been notified and voices understanding.

## 2023-02-22 ENCOUNTER — Ambulatory Visit (HOSPITAL_COMMUNITY)
Admission: RE | Admit: 2023-02-22 | Discharge: 2023-02-22 | Disposition: A | Discharge status: Home / Self Care | Payer: Medicaid Other | Source: Ambulatory Visit | Attending: Cardiology | Admitting: Cardiology

## 2023-02-22 DIAGNOSIS — I081 Rheumatic disorders of both mitral and tricuspid valves: Secondary | ICD-10-CM | POA: Diagnosis not present

## 2023-02-22 DIAGNOSIS — I5022 Chronic systolic (congestive) heart failure: Secondary | ICD-10-CM | POA: Diagnosis present

## 2023-02-22 LAB — ECHOCARDIOGRAM COMPLETE
Area-P 1/2: 4.21 cm2
Calc EF: 60.8 %
S' Lateral: 3.1 cm
Single Plane A2C EF: 63.3 %
Single Plane A4C EF: 57.7 %

## 2023-02-22 NOTE — Progress Notes (Signed)
Echocardiogram 2D Echocardiogram has been performed.  Judith Lowe 02/22/2023, 10:38 AM

## 2023-02-24 ENCOUNTER — Telehealth (HOSPITAL_COMMUNITY): Payer: Self-pay

## 2023-02-24 NOTE — Telephone Encounter (Signed)
Called and left patient a voice message to confirm/remind patient of their appointment at the Advanced Heart Failure Clinic on 02/25/23.    

## 2023-02-24 NOTE — Progress Notes (Signed)
02/25/2023 Left without being seen.  Judith Lowe 2:25 PM

## 2023-02-25 ENCOUNTER — Encounter (HOSPITAL_COMMUNITY): Payer: Self-pay

## 2023-02-25 ENCOUNTER — Ambulatory Visit (HOSPITAL_COMMUNITY)
Admission: RE | Admit: 2023-02-25 | Discharge: 2023-02-25 | Disposition: A | Discharge status: Home / Self Care | Payer: Medicaid Other | Source: Ambulatory Visit | Attending: Adult Health | Admitting: Adult Health

## 2023-03-01 ENCOUNTER — Ambulatory Visit (HOSPITAL_COMMUNITY)
Admission: RE | Admit: 2023-03-01 | Discharge: 2023-03-01 | Disposition: A | Discharge status: Home / Self Care | Payer: Medicaid Other | Source: Ambulatory Visit | Attending: Internal Medicine | Admitting: Internal Medicine

## 2023-03-01 ENCOUNTER — Other Ambulatory Visit (HOSPITAL_COMMUNITY): Payer: Self-pay

## 2023-03-01 DIAGNOSIS — I5022 Chronic systolic (congestive) heart failure: Secondary | ICD-10-CM | POA: Insufficient documentation

## 2023-03-01 LAB — BASIC METABOLIC PANEL
Anion gap: 7 (ref 5–15)
BUN: 10 mg/dL (ref 6–20)
CO2: 27 mmol/L (ref 22–32)
Calcium: 8.8 mg/dL — ABNORMAL LOW (ref 8.9–10.3)
Chloride: 105 mmol/L (ref 98–111)
Creatinine, Ser: 0.74 mg/dL (ref 0.44–1.00)
GFR, Estimated: >60 mL/min (ref >60)
Glucose, Bld: 89 mg/dL (ref 70–99)
Potassium: 3.4 mmol/L — ABNORMAL LOW (ref 3.5–5.1)
Sodium: 139 mmol/L (ref 135–145)

## 2023-03-01 NOTE — Telephone Encounter (Signed)
Advanced Heart Failure Patient Advocate Encounter  Spoke with representative. Rep stated that the status is still being reviewed for update. Will continue to follow up.

## 2023-03-08 ENCOUNTER — Other Ambulatory Visit (HOSPITAL_COMMUNITY): Payer: Self-pay

## 2023-03-08 ENCOUNTER — Telehealth (HOSPITAL_COMMUNITY): Payer: Self-pay | Admitting: *Deleted

## 2023-03-08 NOTE — Telephone Encounter (Signed)
Called to confirm/remind patient of their appointment at the Advanced Heart Failure Clinic on 03/09/23.   Patient reminded to bring all medications and/or complete list.  Confirmed patient has transportation. Gave directions, instructed to utilize valet parking.  Confirmed appointment prior to ending call.   

## 2023-03-08 NOTE — Telephone Encounter (Signed)
Advanced Heart Failure Patient Advocate Encounter  Prior authorization submission is still returning with primary payer rejection. Spoke with representative at [employer]. Primary coverage has not been removed, representative is routing this case to a supervisor and stated that the issue should be resolved within 24 hours. Will follow up.

## 2023-03-08 NOTE — Telephone Encounter (Signed)
Called patient per Dr. Shirlee Latch with following lab result and instructions:  "Increase K in diet."  List of potassium rich foods shared with patient; she verbalized understanding of same and will increase potassium in her diet.

## 2023-03-09 ENCOUNTER — Other Ambulatory Visit (HOSPITAL_COMMUNITY): Payer: Self-pay

## 2023-03-09 ENCOUNTER — Encounter (HOSPITAL_COMMUNITY): Payer: Medicaid Other

## 2023-03-10 ENCOUNTER — Other Ambulatory Visit (HOSPITAL_COMMUNITY): Payer: Self-pay

## 2023-03-10 ENCOUNTER — Telehealth (HOSPITAL_COMMUNITY): Payer: Self-pay

## 2023-03-10 NOTE — Telephone Encounter (Signed)
Called to confirm/remind patient of their appointment at the Advanced Heart Failure Clinic on 03/11/23.   Patient reminded to bring all medications and/or complete list.  Confirmed patient has transportation. Gave directions, instructed to utilize valet parking.  Confirmed appointment prior to ending call.   

## 2023-03-10 NOTE — Telephone Encounter (Signed)
Advanced Heart Failure Patient Advocate Engineer, manufacturing for update. Today's representative stated that the case was not marked as urgent. She has marked the case as 'urgent' as the patient is currently actively being denied access to the coverage. Claim should be reviewed 'within 24 hours.' Will continue to follow up.

## 2023-03-10 NOTE — Progress Notes (Signed)
ADVANCED HF CLINIC NOTE   Primary Care: Hoy Register, MD HF Cardiologist: Dr. Shirlee Latch  HPI: Judith Lowe is a 42 y.o. female with history of ETOH and substance abuse (cocaine), tobacco user and new diagnosis of HTN, IDA, hyperthyroidism, and systolic heart failure.  She was admitted 12/22 with new acute heart failure, hypoxia requiring BiPap and hypertensive urgency. She was diuresed with IV Lasix and started on nitro gtt. Echo showed EF 40%, RV normal, mild MR and mild pericardial effusion. UDS + cocaine/THC. ECG with new LBBB. Underwent R/LHC showing no CAD and low filling pressures. She was started on GDMT. TSH low with high free T4 suggestive of hyperthyroidism, she was started on methimazole. Discharge weight 113 lbs.  Echo in 4/23 showed improved EF 55%, normal diastolic function, mild LVH, normal RV, small pericardial effusion, dilated IVC, PASP 35 mmHg.   4/23 labs showed TSH now elevated and free T4 low.  Methimazole was stopped.  8/23 labs showed normal TSH and free T3 with mildly decreased free T4.    Today she returns for AHF follow up. Overall feeling ***. Denies palpitations, CP, dizziness, edema, or PND/Orthopnea. *** SOB. Appetite ok. No fever or chills. Weight at home *** pounds. Taking all medications. ETOH, smoking ***   Labs (1/23): K 3.1, creatinine 0.83 Labs (3/23): K 3.7, creatinine 1.23 Labs (4/23): BNP 18, K 4.6, creatinine 1.19, TSH 92, free T4 < 0.25 Labs (8/23): TSH normal, free T3 normal, free T4 mildly decreased.  Labs (1/24): K 4.5, creatinine 0.91, TSH normal Labs (5/24): K 3.5, SCr .74  PMH: 1. H/o ETOH abuse 2. H/o cocaine abuse 3. Hyperthyroidism => hypothyroidism 4. HTN 5. Fe deficiency anemia 6. Chronic systolic CHF: Nonischemic cardiomyopathy.  - Echo (12/22): EF 40%, RV normal, mild MR and mild pericardial effusion - LHC/RHC (12/22): No significant CAD; mean RA 1, PA 24/5, mean PCWP 5, CI 3.8 - Echo (4/23): EF 55%, normal diastolic  function, mild LVH, normal RV, small pericardial effusion, dilated IVC, PASP 35 mmHg.   Current Outpatient Medications  Medication Sig Dispense Refill   carvedilol (COREG) 6.25 MG tablet Take 1 tablet (6.25 mg total) by mouth 2 (two) times daily with a meal. 180 tablet 1   dapagliflozin propanediol (FARXIGA) 10 MG TABS tablet Take 1 tablet (10 mg total) by mouth daily. 90 tablet 1   potassium chloride (KLOR-CON) 10 MEQ tablet Take 1 tablet (10 mEq total) by mouth daily. 90 tablet 3   sacubitril-valsartan (ENTRESTO) 49-51 MG Take 1 tablet by mouth 2 (two) times daily. 180 tablet 3   spironolactone (ALDACTONE) 25 MG tablet Take 1 tablet (25 mg total) by mouth daily. 90 tablet 1   No current facility-administered medications for this visit.   No Known Allergies  Social History   Socioeconomic History   Marital status: Single    Spouse name: Not on file   Number of children: Not on file   Years of education: Not on file   Highest education level: Not on file  Occupational History   Occupation: unemployed  Tobacco Use   Smoking status: Every Day    Packs/day: 0.25    Years: 5.00    Additional pack years: 0.00    Total pack years: 1.25    Types: Cigarettes   Smokeless tobacco: Never  Substance and Sexual Activity   Alcohol use: Yes    Alcohol/week: 1.0 standard drink of alcohol    Types: 1 Glasses of wine per week  Comment: occaisionally; once every two weeks    Drug use: Yes    Types: Marijuana, Cocaine    Comment: pt reports smoking everyday   Sexual activity: Yes    Birth control/protection: None  Other Topics Concern   Not on file  Social History Narrative   Not on file   Social Determinants of Health   Financial Resource Strain: Not on file  Food Insecurity: Not on file  Transportation Needs: Not on file  Physical Activity: Not on file  Stress: Not on file  Social Connections: Not on file  Intimate Partner Violence: Not on file   Family History  Problem  Relation Age of Onset   Hypertension Mother    There were no vitals taken for this visit.  Wt Readings from Last 3 Encounters:  02/17/23 57.6 kg (127 lb)  12/04/22 58.8 kg (129 lb 9.6 oz)  06/24/22 59.6 kg (131 lb 6.4 oz)   PHYSICAL EXAM: General:  *** appearing.  No respiratory difficulty HEENT: normal Neck: supple. JVD *** cm. Carotids 2+ bilat; no bruits. No lymphadenopathy or thyromegaly appreciated. Cor: PMI nondisplaced. Regular rate & rhythm. No rubs, gallops or murmurs. Lungs: clear Abdomen: soft, nontender, nondistended. No hepatosplenomegaly. No bruits or masses. Good bowel sounds. Extremities: no cyanosis, clubbing, rash, edema  Neuro: alert & oriented x 3, cranial nerves grossly intact. moves all 4 extremities w/o difficulty. Affect pleasant.   ASSESSMENT & PLAN: 1. Chronic systolic CHF: Nonischemic cardiomyopathy.  Echo 1/23 EF 40-45% with inferior/inferoseptal HK, low normal RV function, mild-moderate MR, elevated AoV gradient at 11 mmHg but valve opens normally, dilated IVC, pleural effusion.  She has history of cocaine and ETOH abuse. Also h/o uncontrolled HTN.  No FH of CMP that she knows of.  She was found to have hyperthyroidism which may play a role in both cardiomyopathy and HTN.  Cath in 12/22 showed low filling pressures and no CAD.  Echo in 4/23 showed recovery of EF to 55% with normal diastolic function and normal RV.  Currently NYHA class I, not volume overloaded on exam.  BP is on the lower side.  - Does not need additional diuretic ***  - Decrease Entresto to 49/51 bid.   - Continue Farxiga 10 mg daily. - Continue spironolactone 25 mg daily. BMET today.  - Continue Coreg 6.25 mg bid.  - Continue to avoid cocaine and ETOH.  - Repeat echo at 1 year in 4/24 to make sure EF remains normal.  2. HTN: Long-standing.  Suspect hyperthyroidism played a role. Renal artery dopplers did not show FMD.  Renin/aldosterone levels unrevealing.  BP now low.  3. Fe deficiency  anemia: Has been treated with feraheme.  4. Hyperthyroidism => hypothyroidism: TSH markedly low during 12/22 admit, also with tachycardia/weight loss/cardiomyopathy.  Thyroid US consistent with thyroiditis. Patient started on methimazole.  4/23 thyroid indices showed elevated TSH and low free T4.  Methimazole stopped.  TSH was normal in 1/24.  - followed by endocrinology   Followup 3 months for BMET, can see APP 1 year if echo shows that EF remains normal.  Should get BMET q3 months.   Alen Bleacher AGACNP-BC  03/10/2023 [facility] [address] Heart and Vascular [place] [address] [phone] (office) [phone] (fax)

## 2023-03-11 ENCOUNTER — Other Ambulatory Visit (HOSPITAL_COMMUNITY): Payer: Self-pay

## 2023-03-11 ENCOUNTER — Encounter (HOSPITAL_COMMUNITY): Payer: Self-pay

## 2023-03-11 ENCOUNTER — Ambulatory Visit (HOSPITAL_COMMUNITY)
Admission: RE | Admit: 2023-03-11 | Discharge: 2023-03-11 | Disposition: A | Discharge status: Home / Self Care | Payer: Medicaid Other | Source: Ambulatory Visit | Attending: Physician Assistant | Admitting: Physician Assistant

## 2023-03-11 VITALS — BP 130/82 | HR 76 | Wt 133.0 lb

## 2023-03-11 DIAGNOSIS — I428 Other cardiomyopathies: Secondary | ICD-10-CM | POA: Insufficient documentation

## 2023-03-11 DIAGNOSIS — J90 Pleural effusion, not elsewhere classified: Secondary | ICD-10-CM | POA: Diagnosis not present

## 2023-03-11 DIAGNOSIS — E0590 Thyrotoxicosis, unspecified without thyrotoxic crisis or storm: Secondary | ICD-10-CM | POA: Insufficient documentation

## 2023-03-11 DIAGNOSIS — E039 Hypothyroidism, unspecified: Secondary | ICD-10-CM | POA: Insufficient documentation

## 2023-03-11 DIAGNOSIS — I5022 Chronic systolic (congestive) heart failure: Secondary | ICD-10-CM | POA: Insufficient documentation

## 2023-03-11 DIAGNOSIS — R000 Tachycardia, unspecified: Secondary | ICD-10-CM | POA: Diagnosis not present

## 2023-03-11 DIAGNOSIS — I110 Hypertensive heart disease with heart failure: Secondary | ICD-10-CM | POA: Insufficient documentation

## 2023-03-11 DIAGNOSIS — Z79899 Other long term (current) drug therapy: Secondary | ICD-10-CM | POA: Diagnosis not present

## 2023-03-11 DIAGNOSIS — D509 Iron deficiency anemia, unspecified: Secondary | ICD-10-CM | POA: Insufficient documentation

## 2023-03-11 DIAGNOSIS — I10 Essential (primary) hypertension: Secondary | ICD-10-CM

## 2023-03-11 DIAGNOSIS — I50812 Chronic right heart failure: Secondary | ICD-10-CM

## 2023-03-11 DIAGNOSIS — F1010 Alcohol abuse, uncomplicated: Secondary | ICD-10-CM | POA: Diagnosis not present

## 2023-03-11 LAB — BASIC METABOLIC PANEL
Anion gap: 6 (ref 5–15)
BUN: 8 mg/dL (ref 6–20)
CO2: 26 mmol/L (ref 22–32)
Calcium: 8.9 mg/dL (ref 8.9–10.3)
Chloride: 107 mmol/L (ref 98–111)
Creatinine, Ser: 0.75 mg/dL (ref 0.44–1.00)
GFR, Estimated: >60 mL/min (ref >60)
Glucose, Bld: 98 mg/dL (ref 70–99)
Potassium: 3.5 mmol/L (ref 3.5–5.1)
Sodium: 139 mmol/L (ref 135–145)

## 2023-03-11 LAB — BRAIN NATRIURETIC PEPTIDE: B Natriuretic Peptide: 125.1 pg/mL — ABNORMAL HIGH (ref 0.0–100.0)

## 2023-03-11 MED ORDER — FUROSEMIDE 20 MG PO TABS: 20.0000 mg | ORAL_TABLET | Freq: Every day | ORAL | 3 refills | Status: DC

## 2023-03-11 NOTE — Patient Instructions (Signed)
Thank you for coming in today  If you had labs drawn today, any labs that are abnormal the clinic will call you No news is good news  Medications: START 20 mg daily   Follow up appointments: Your physician recommends that you return for lab work in:  10 days BMET BNP  Your physician recommends that you schedule a follow-up appointment in:  3-4 months With Dr. Shirlee Latch    Do the following things EVERYDAY: Weigh yourself in the morning before breakfast. Write it down and keep it in a log. Take your medicines as prescribed Eat low salt foods--Limit salt (sodium) to 2000 mg per day.  Stay as active as you can everyday Limit all fluids for the day to less than 2 liters   At the [facility], you and your health needs are our priority. As part of our continuing mission to provide you with exceptional heart care, we have created designated Provider Care Teams. These Care Teams include your primary Cardiologist (physician) and Advanced Practice Providers (APPs- Physician Assistants and Nurse Practitioners) who all work together to provide you with the care you need, when you need it.   You may see any of the following providers on your designated Care Team at your next follow up: Dr Arvilla Meres Dr Marca Ancona Dr. Marcos Eke, NP Robbie Lis, [place] [facility] [place], [place] Brynda Peon, NP Karle Plumber, PharmD   Please be sure to bring in all your medications bottles to every appointment.    Thank you for choosing [facility]  If you have any questions or concerns before your next appointment please send [place] a message through [place] or call our office at [phone].    TO LEAVE A MESSAGE FOR THE NURSE SELECT OPTION 2, PLEASE LEAVE A MESSAGE INCLUDING: YOUR NAME DATE OF BIRTH CALL BACK NUMBER REASON FOR CALL**this is important as we prioritize the call backs  YOU WILL RECEIVE A CALL BACK  THE SAME DAY AS LONG AS YOU CALL BEFORE 4:00 PM

## 2023-03-11 NOTE — Progress Notes (Signed)
ReDS Vest / Clip - 03/11/23 1000       ReDS Vest / Clip   Station Marker C    Ruler Value 25    ReDS Value Range Moderate volume overload    ReDS Actual Value 39

## 2023-03-11 NOTE — Telephone Encounter (Signed)
Advanced Heart Failure Patient Advocate Encounter  Test claims are still returning as 'submit to primary.' Will contact [employer] again tomorrow (5/17) if claims are still not going through.

## 2023-03-12 ENCOUNTER — Other Ambulatory Visit (HOSPITAL_COMMUNITY): Payer: Self-pay

## 2023-03-12 NOTE — Telephone Encounter (Signed)
Advanced Heart Failure Patient Advocate Encounter  Issue Resolved! [employer] is now listed as primary coverage for the patient.  Prior authorization for Marcelline Deist approved CMM Key [id number] Effective 03/12/2023 to 03/11/2024.  Test billing for Marcelline Deist returns $4 copay for 90 days. Test billing for Sherryll Burger returns $4 copay for 90 days.  Contacted pt preferred pharmacy to process refills, pharmacy confirmed paid claims. Informed patient by phone, patient expressed gratitude. Nothing further needed at this time.  Burnell Blanks, CPhT Rx Patient Advocate Phone: [phone]

## 2023-03-23 ENCOUNTER — Other Ambulatory Visit (HOSPITAL_COMMUNITY): Payer: Medicaid Other

## 2023-04-19 ENCOUNTER — Other Ambulatory Visit: Payer: Medicaid Other

## 2023-06-02 ENCOUNTER — Encounter: Payer: Self-pay | Admitting: Internal Medicine

## 2023-06-02 ENCOUNTER — Ambulatory Visit: Payer: Medicaid Other | Admitting: Internal Medicine

## 2023-06-02 NOTE — Progress Notes (Deleted)
Name: Judith Lowe  MRN/ DOB: [id number], 11/26/1980    Age/ Sex: 42 y.o., female    PCP: Hoy Register, MD   Reason for Endocrinology Evaluation: Hyperthyroidism     Date of Initial Endocrinology Evaluation: 02/17/2023    HPI: Judith Lowe is a 42 y.o. female with a past medical history of CHF, polysubstance abuse, hyperthyroid. The patient presented for initial endocrinology clinic visit on 02/17/2023 for consultative assistance with her Hyperthyroidism.   Patient has been noted with hyperthyroid since 09/2021 with a suppressed TSH at 0.01 u IU/mL with elevated free T4 at 4 NG/DL and T3 at 44.0 PG/mL  She has been on methimazole intermittently since 09/2021  Thyroid ultrasound did not reveal any nodules 09/2021  TRAb undetectable   Mother and maternal aunts with thyroid nodules    On her initial visit to our clinic her TFTs were normal and she has not been on methimazole for a while.    SUBJECTIVE:    Today (06/02/23):  Judith Lowe is here for a follow-up on history of hyperthyroidism.  She is c/o dysphagia as well as odynophagia  Denies local neck swelling Has palpitations, tremors  Has thinning of the hair  Has difficulty gaining weight   Has mild eye symptoms with itching and burning   HISTORY:  Past Medical History:  Past Medical History:  Diagnosis Date   Drug use    ETOH abuse    Past Surgical History:  Past Surgical History:  Procedure Laterality Date   NO PAST SURGERIES     RIGHT/LEFT HEART CATH AND CORONARY ANGIOGRAPHY N/A 10/08/2021   Procedure: RIGHT/LEFT HEART CATH AND CORONARY ANGIOGRAPHY;  Surgeon: Laurey Morale, MD;  Location: [facility];  Service: Cardiovascular;  Laterality: N/A;    Social History:  reports that she has been smoking cigarettes. She has a 1.3 pack-year smoking history. She has never used smokeless tobacco. She reports current alcohol use of about 1.0 standard drink of alcohol per week. She reports current drug  use. Drugs: Marijuana and Cocaine. Family History: family history includes Hypertension in her mother.   HOME MEDICATIONS: Allergies as of 06/02/2023   No Known Allergies      Medication List        Accurate as of June 02, 2023  6:50 AM. If you have any questions, ask your nurse or doctor.          carvedilol 6.25 MG tablet Commonly known as: COREG Take 1 tablet (6.25 mg total) by mouth 2 (two) times daily with a meal.   dapagliflozin propanediol 10 MG Tabs tablet Commonly known as: Farxiga Take 1 tablet (10 mg total) by mouth daily.   Entresto 49-51 MG Generic drug: sacubitril-valsartan Take 1 tablet by mouth 2 (two) times daily.   furosemide 20 MG tablet Commonly known as: LASIX Take 1 tablet (20 mg total) by mouth daily.   potassium chloride 10 MEQ tablet Commonly known as: KLOR-CON Take 1 tablet (10 mEq total) by mouth daily.   spironolactone 25 MG tablet Commonly known as: ALDACTONE Take 1 tablet (25 mg total) by mouth daily.          REVIEW OF SYSTEMS: A comprehensive ROS was conducted with the patient and is negative except as per HPI     OBJECTIVE:  VS: There were no vitals taken for this visit.   Wt Readings from Last 3 Encounters:  03/11/23 133 lb (60.3 kg)  02/17/23 127 lb (57.6 kg)  12/04/22 129 lb 9.6 oz (58.8 kg)     EXAM: General: Pt appears well and is in NAD  Eyes: External eye exam normal without stare, lid lag or exophthalmos.  EOM intact.    Neck: General: Supple without adenopathy. Thyroid: Thyroid ~ 50 grams.   No  nodules appreciated. No thyroid bruit.  Lungs: Clear with good BS bilat   Heart: Auscultation: RRR.  Abdomen: soft, nontender  Extremities:  BL LE: No pretibial edema normal   Mental Status: Judgment, insight: Intact Orientation: Oriented to time, place, and person Mood and affect: No depression, anxiety, or agitation     DATA REVIEWED:     Latest Reference Range & Units 02/17/23 13:44  TSH 0.35 - 5.50  uIU/mL 1.05  T4,Free(Direct) 0.60 - 1.60 ng/dL 4.09    ASSESSMENT/PLAN/RECOMMENDATIONS:   Hyperthyroidism:   - Discussed pathophysiology of hyperthyroidism and its cardiac impact.  We discussed the importance of keeping euthyroid status -She has not been on methimazole for a while -Discussed the importance of taking it on regular basis -High suspicion for Graves' disease, TRAb pending -TFTs have normalized, no indication for thionamides therapy at this time    Follow-up in 4 months Labs in 2 months  Signed electronically by: Lyndle Herrlich, MD  [facility] Endocrinology  [facility] [address] [place] [place], [place] [address] Phone: [phone] FAX: [phone]   CC: Hoy Register, MD [address] [place] [address] [place] [address] Phone: [phone] Fax: [phone]   Return to Endocrinology clinic as below: Future Appointments  Date Time Provider Department Center  06/02/2023  7:30 AM , Konrad Dolores, MD [facility] None  06/11/2023  9:00 AM Laurey Morale, MD [facility] None

## 2023-06-11 ENCOUNTER — Encounter (HOSPITAL_COMMUNITY): Payer: Medicaid Other | Admitting: Cardiology

## 2023-08-06 ENCOUNTER — Ambulatory Visit: Payer: Medicaid Other | Admitting: Internal Medicine

## 2023-08-10 ENCOUNTER — Encounter (HOSPITAL_COMMUNITY): Payer: Self-pay | Admitting: Cardiology

## 2023-08-10 ENCOUNTER — Ambulatory Visit (HOSPITAL_COMMUNITY)
Admission: RE | Admit: 2023-08-10 | Discharge: 2023-08-10 | Disposition: A | Discharge status: Home / Self Care | Payer: Medicaid Other | Source: Ambulatory Visit | Attending: Cardiology | Admitting: Cardiology

## 2023-08-10 VITALS — BP 130/80 | HR 87 | Wt 126.0 lb

## 2023-08-10 DIAGNOSIS — E0590 Thyrotoxicosis, unspecified without thyrotoxic crisis or storm: Secondary | ICD-10-CM | POA: Insufficient documentation

## 2023-08-10 DIAGNOSIS — F17210 Nicotine dependence, cigarettes, uncomplicated: Secondary | ICD-10-CM | POA: Diagnosis not present

## 2023-08-10 DIAGNOSIS — I110 Hypertensive heart disease with heart failure: Secondary | ICD-10-CM | POA: Diagnosis present

## 2023-08-10 DIAGNOSIS — I428 Other cardiomyopathies: Secondary | ICD-10-CM | POA: Insufficient documentation

## 2023-08-10 DIAGNOSIS — I5022 Chronic systolic (congestive) heart failure: Secondary | ICD-10-CM | POA: Diagnosis present

## 2023-08-10 DIAGNOSIS — R9431 Abnormal electrocardiogram [ECG] [EKG]: Secondary | ICD-10-CM | POA: Diagnosis not present

## 2023-08-10 LAB — BASIC METABOLIC PANEL
Anion gap: 8 (ref 5–15)
BUN: 18 mg/dL (ref 6–20)
CO2: 24 mmol/L (ref 22–32)
Calcium: 9.6 mg/dL (ref 8.9–10.3)
Chloride: 106 mmol/L (ref 98–111)
Creatinine, Ser: 0.86 mg/dL (ref 0.44–1.00)
GFR, Estimated: >60 mL/min (ref >60)
Glucose, Bld: 74 mg/dL (ref 70–99)
Potassium: 3.8 mmol/L (ref 3.5–5.1)
Sodium: 138 mmol/L (ref 135–145)

## 2023-08-10 LAB — TSH: TSH: 1.024 u[IU]/mL (ref 0.350–4.500)

## 2023-08-10 LAB — T4, FREE: Free T4: 0.74 ng/dL (ref 0.61–1.12)

## 2023-08-10 LAB — BRAIN NATRIURETIC PEPTIDE: B Natriuretic Peptide: 35.1 pg/mL (ref 0.0–100.0)

## 2023-08-10 MED ORDER — ENTRESTO 49-51 MG PO TABS: 1.0000 | ORAL_TABLET | Freq: Two times a day (BID) | ORAL | 3 refills | Status: DC

## 2023-08-10 MED ORDER — POTASSIUM CHLORIDE ER 10 MEQ PO TBCR: 10.0000 meq | EXTENDED_RELEASE_TABLET | Freq: Every day | ORAL | 3 refills | Status: DC

## 2023-08-10 MED ORDER — SPIRONOLACTONE 25 MG PO TABS: 25.0000 mg | ORAL_TABLET | Freq: Every day | ORAL | 3 refills | Status: AC

## 2023-08-10 MED ORDER — FUROSEMIDE 20 MG PO TABS: 20.0000 mg | ORAL_TABLET | Freq: Every day | ORAL | 3 refills | Status: DC

## 2023-08-10 MED ORDER — CARVEDILOL 6.25 MG PO TABS: 6.2500 mg | ORAL_TABLET | Freq: Two times a day (BID) | ORAL | 3 refills | Status: AC

## 2023-08-10 NOTE — Patient Instructions (Signed)
There has been no changes to your medications.  Labs done today, your results will be available in MyChart, we will contact you for abnormal readings.  Your physician has requested that you have an echocardiogram. Echocardiography is a painless test that uses sound waves to create images of your heart. It provides your doctor with information about the size and shape of your heart and how well your heart's chambers and valves are working. This procedure takes approximately one hour. There are no restrictions for this procedure. Please do NOT wear cologne, perfume, aftershave, or lotions (deodorant is allowed). Please arrive 15 minutes prior to your appointment time.  Your physician recommends that you schedule a follow-up appointment in: 3 months.  If you have any questions or concerns before your next appointment please send Judith Lowe a message through [place] or call our office at [phone].    TO LEAVE A MESSAGE FOR THE NURSE SELECT OPTION 2, PLEASE LEAVE A MESSAGE INCLUDING: YOUR NAME DATE OF BIRTH CALL BACK NUMBER REASON FOR CALL**this is important as we prioritize the call backs  YOU WILL RECEIVE A CALL BACK THE SAME DAY AS LONG AS YOU CALL BEFORE 4:00 PM  At the [facility], you and your health needs are our priority. As part of our continuing mission to provide you with exceptional heart care, we have created designated Provider Care Teams. These Care Teams include your primary Cardiologist (physician) and Advanced Practice Providers (APPs- Physician Assistants and Nurse Practitioners) who all work together to provide you with the care you need, when you need it.   You may see any of the following providers on your designated Care Team at your next follow up: Dr Arvilla Meres Dr Marca Ancona Dr. Dorthula Nettles Dr. Clearnce Hasten Amy Filbert Schilder, NP Robbie Lis, [place] [facility], [place] Brynda Peon, NP [place] Lee, NP Karle Plumber,  PharmD   Please be sure to bring in all your medications bottles to every appointment.    Thank you for choosing [facility]

## 2023-08-10 NOTE — Progress Notes (Signed)
ADVANCED HF CLINIC NOTE   Primary Care: Hoy Register, MD HF Cardiologist: Dr. Shirlee Latch  HPI: Judith Lowe is a 42 y.o. female with history of ETOH and substance abuse (cocaine), tobacco user and new diagnosis of HTN, IDA, hyperthyroidism, and systolic heart failure.  She was admitted 12/22 with new acute heart failure, hypoxia requiring BiPap and hypertensive urgency. She was diuresed with IV Lasix and started on nitro gtt. Echo showed EF 40%, RV normal, mild MR and mild pericardial effusion. UDS + cocaine/THC. ECG with new LBBB. Underwent R/LHC showing no CAD and low filling pressures. She was started on GDMT. TSH low with high free T4 suggestive of hyperthyroidism, she was started on methimazole. Discharge weight 113 lbs.  Echo in 4/23 showed improved EF 55%, normal diastolic function, mild LVH, normal RV, small pericardial effusion, dilated IVC, PASP 35 mmHg.   4/23 labs showed TSH now elevated and free T4 low.  Methimazole was stopped.  8/23 labs showed normal TSH and free T3 with mildly decreased free T4.    Echo 4/24 EF 55-60%, normal RV, mild MR, IVC dilated  Today she returns for AHF follow up. She is not using drugs or smoking.  Last several ECGs did not show a LBBB, but today's ECG shows a LBBB again. She reports more fatigue recently/poor energy. She is not, however, short of breath with her usual activities.  No chest pain.  No orthopnea/PND.  No lightheadedness.  She is taking her medications.  Currently unemployed. Weight down 7 lbs.   ECG (personally reviewed): NSR, LBBB 144 msec  Labs (1/23): K 3.1, creatinine 0.83 Labs (3/23): K 3.7, creatinine 1.23 Labs (4/23): BNP 18, K 4.6, creatinine 1.19, TSH 92, free T4 < 0.25 Labs (8/23): TSH normal, free T3 normal, free T4 mildly decreased.  Labs (1/24): K 4.5, creatinine 0.91, TSH normal Labs (5/24): K 3.5, creatinine 0.78, BNP 125  PMH: 1. H/o ETOH abuse 2. H/o cocaine abuse 3. Hyperthyroidism => hypothyroidism 4.  HTN 5. Fe deficiency anemia 6. Chronic systolic CHF: Nonischemic cardiomyopathy.  - Echo (12/22): EF 40%, RV normal, mild MR and mild pericardial effusion - LHC/RHC (12/22): No significant CAD; mean RA 1, PA 24/5, mean PCWP 5, CI 3.8 - Echo (4/23): EF 55%, normal diastolic function, mild LVH, normal RV, small  - Echo (4/24): EF 55-60%, normal RV, mild MR, IVC dilated   Current Outpatient Medications  Medication Sig Dispense Refill   dapagliflozin propanediol (FARXIGA) 10 MG TABS tablet Take 1 tablet (10 mg total) by mouth daily. 90 tablet 1   carvedilol (COREG) 6.25 MG tablet Take 1 tablet (6.25 mg total) by mouth 2 (two) times daily with a meal. 180 tablet 3   furosemide (LASIX) 20 MG tablet Take 1 tablet (20 mg total) by mouth daily. 90 tablet 3   potassium chloride (KLOR-CON) 10 MEQ tablet Take 1 tablet (10 mEq total) by mouth daily. 90 tablet 3   sacubitril-valsartan (ENTRESTO) 49-51 MG Take 1 tablet by mouth 2 (two) times daily. 180 tablet 3   spironolactone (ALDACTONE) 25 MG tablet Take 1 tablet (25 mg total) by mouth daily. 90 tablet 3   No current facility-administered medications for this encounter.   No Known Allergies  Social History   Socioeconomic History   Marital status: Single    Spouse name: Not on file   Number of children: Not on file   Years of education: Not on file   Highest education level: Not on file  Occupational History  Occupation: unemployed  Tobacco Use   Smoking status: Every Day    Current packs/day: 0.25    Average packs/day: 0.3 packs/day for 5.0 years (1.3 ttl pk-yrs)    Types: Cigarettes   Smokeless tobacco: Never  Substance and Sexual Activity   Alcohol use: Yes    Alcohol/week: 1.0 standard drink of alcohol    Types: 1 Glasses of wine per week    Comment: occaisionally; once every two weeks    Drug use: Yes    Types: Marijuana, Cocaine    Comment: pt reports smoking everyday   Sexual activity: Yes    Birth control/protection: None   Other Topics Concern   Not on file  Social History Narrative   Not on file   Social Determinants of Health   Financial Resource Strain: Not on file  Food Insecurity: Not on file  Transportation Needs: Not on file  Physical Activity: Not on file  Stress: Not on file  Social Connections: Not on file  Intimate Partner Violence: Not on file   Family History  Problem Relation Age of Onset   Hypertension Mother    BP 130/80   Pulse 87   Wt 57.2 kg (126 lb)   SpO2 99%   BMI 19.16 kg/m   Wt Readings from Last 3 Encounters:  08/10/23 57.2 kg (126 lb)  03/11/23 60.3 kg (133 lb)  02/17/23 57.6 kg (127 lb)   PHYSICAL EXAM: General: NAD Neck: JVP 8 cm, + thyromegaly or thyroid nodule.  Lungs: Clear to auscultation bilaterally with normal respiratory effort. CV: Nondisplaced PMI.  Heart regular S1/S2, no S3/S4, no murmur.  No peripheral edema.  No carotid bruit.  Normal pedal pulses.  Abdomen: Soft, nontender, no hepatosplenomegaly, no distention.  Skin: Intact without lesions or rashes.  Neurologic: Alert and oriented x 3.  Psych: Normal affect. Extremities: No clubbing or cyanosis.  HEENT: Normal.   ASSESSMENT & PLAN: 1. Chronic systolic CHF: Nonischemic cardiomyopathy.  Echo 1/23 EF 40-45% with inferior/inferoseptal HK, low normal RV function, mild-moderate MR, elevated AoV gradient at 11 mmHg but valve opens normally, dilated IVC, pleural effusion.  She has history of cocaine and ETOH abuse. Also h/o uncontrolled HTN.  No FH of CMP that she knows of.  She was found to have hyperthyroidism which may play a role in both cardiomyopathy and HTN.  Cath in 12/22 showed low filling pressures and no CAD.  Echo 4/23 showed recovery of EF to 55% with normal diastolic function and normal RV.  Echo 4/24 EF 55-60%, normal RV, mild MR, IVC dilated.  Patient reports increased fatigue and poor energy though she denies significant dyspnea.  Not significantly volume overloaded.  She does have a new  LBBB on ECG today (had in the past but resolved).   - With new LBBB and fatigue, I am going to repeat echo.  - Continue Lasix 20 mg daily.  BMET/BNP today.  - Continue Entresto 49/51 bid.   - Continue Farxiga 10 mg daily. - Continue spironolactone 25 mg daily.  - Continue Coreg at 6.25 mg BID - Continue to avoid cocaine and ETOH.  2. HTN: Long-standing.  Suspect hyperthyroidism played a role. Renal artery dopplers did not show FMD.  Renin/aldosterone levels unrevealing.  BP now controlled.   3. Fe deficiency anemia: Has been treated with feraheme.  4. Hyperthyroidism => hypothyroidism: TSH markedly low during 12/22 admit, also with tachycardia/weight loss/cardiomyopathy.  Thyroid US consistent with thyroiditis. Patient started on methimazole.  4/23 thyroid indices  showed elevated TSH and low free T4.  Methimazole stopped.  TSH was normal in 1/24.  - followed by endocrinology - Check free T3, free T4, TSH   Follow up in 3 months with APP.   Marca Ancona  08/10/2023 [facility] [place] [address] Heart and Vascular [place] [address] [phone] (office) [phone] (fax)

## 2023-08-11 LAB — T3, FREE: T3, Free: 2.4 pg/mL (ref 2.0–4.4)

## 2023-09-27 ENCOUNTER — Ambulatory Visit (HOSPITAL_COMMUNITY)
Admission: RE | Admit: 2023-09-27 | Discharge: 2023-09-27 | Disposition: A | Discharge status: Home / Self Care | Payer: Medicaid Other | Source: Ambulatory Visit | Attending: Cardiology | Admitting: Cardiology

## 2023-09-27 DIAGNOSIS — I5022 Chronic systolic (congestive) heart failure: Secondary | ICD-10-CM | POA: Diagnosis not present

## 2023-09-27 DIAGNOSIS — I447 Left bundle-branch block, unspecified: Secondary | ICD-10-CM | POA: Insufficient documentation

## 2023-09-27 DIAGNOSIS — R9431 Abnormal electrocardiogram [ECG] [EKG]: Secondary | ICD-10-CM | POA: Insufficient documentation

## 2023-09-27 DIAGNOSIS — I071 Rheumatic tricuspid insufficiency: Secondary | ICD-10-CM | POA: Diagnosis not present

## 2023-09-27 LAB — ECHOCARDIOGRAM COMPLETE
AR max vel: 2.59 cm2
AV Area VTI: 2.54 cm2
AV Area mean vel: 2.63 cm2
AV Mean grad: 4 mm[Hg]
AV Peak grad: 9 mm[Hg]
Ao pk vel: 1.5 m/s
Area-P 1/2: 3.54 cm2
Calc EF: 48 %
S' Lateral: 3.5 cm
Single Plane A2C EF: 57.1 %
Single Plane A4C EF: 42.2 %

## 2023-10-05 ENCOUNTER — Telehealth (HOSPITAL_COMMUNITY): Payer: Self-pay

## 2023-10-05 NOTE — Telephone Encounter (Signed)
-----   Message from [employer] sent at 09/27/2023  6:20 PM EST ----- EF 50-55%, lower than prior with new LBBB. IVC dilation suggests volume overload.  Please get her in with APP soon.

## 2023-10-05 NOTE — Telephone Encounter (Signed)
No answer, Left message to return call. Mychart message sent.

## 2023-11-05 NOTE — Progress Notes (Addendum)
 ADVANCED HF CLINIC NOTE   Primary Care: Joaquin Mulberry, MD HF Cardiologist: Dr. Mitzie Anda  CC: HF follow up  HPI: Judith Lowe is a 42 y.o. female with history of ETOH and substance abuse (cocaine), tobacco user and new diagnosis of HTN, IDA, hyperthyroidism, and systolic heart failure.  She was admitted 12/22 with new acute heart failure, hypoxia requiring BiPap and hypertensive urgency. She was diuresed with IV Lasix  and started on nitro gtt. Echo showed EF 40%, RV normal, mild MR and mild pericardial effusion. UDS + cocaine/THC. ECG with new LBBB. Underwent R/LHC showing no CAD and low filling pressures. She was started on GDMT. TSH low with high free T4 suggestive of hyperthyroidism, she was started on methimazole . Discharge weight 113 lbs.  Echo in 4/23 showed improved EF 55%, normal diastolic function, mild LVH, normal RV, small pericardial effusion, dilated IVC, PASP 35 mmHg.   4/23 labs showed TSH now elevated and free T4 low.  Methimazole  was stopped.  8/23 labs showed normal TSH and free T3 with mildly decreased free T4.    Echo 4/24 EF 55-60%, normal RV, mild MR   Follow up 10/24, had a new LBBB on ECG and endorsed fatigue. Echo arranged 12/24, showed EF 50-55%, mildly reduced from previous.   Today she returns for HF follow up. Overall feeling fine. She has SOB sweeping her house, but otherwise no DOE. Denies palpitations, CP, dizziness, edema, or PND/Orthopnea. Appetite ok. No fever or chills. Weight at home 124 pounds. Taking all medications. She snores. No cocaine/ETOH or tobacco x 1 year.  ECG (personally reviewed): none ordered today.  ReDs Clip today: 35%  Labs (1/23): K 3.1, creatinine 0.83 Labs (3/23): K 3.7, creatinine 1.23 Labs (4/23): BNP 18, K 4.6, creatinine 1.19, TSH 92, free T4 < 0.25 Labs (8/23): TSH normal, free T3 normal, free T4 mildly decreased.  Labs (1/24): K 4.5, creatinine 0.91, TSH normal Labs (5/24): K 3.5, creatinine 0.78, BNP 125 Labs (10/24):  K 3.8, creatinine 0.86  PMH: 1. H/o ETOH abuse 2. H/o cocaine abuse 3. Hyperthyroidism => hypothyroidism 4. HTN 5. Fe deficiency anemia 6. Chronic systolic CHF: Nonischemic cardiomyopathy.  - Echo (12/22): EF 40%, RV normal, mild MR and mild pericardial effusion - LHC/RHC (12/22): No significant CAD; mean RA 1, PA 24/5, mean PCWP 5, CI 3.8 - Echo (4/23): EF 55%, normal diastolic function, mild LVH, normal RV, small  - Echo (4/24): EF 55-60%, normal RV, mild MR, IVC dilated - Echo (12/24): EF 50-55%, IVC dilated.  Current Outpatient Medications  Medication Sig Dispense Refill   carvedilol  (COREG ) 6.25 MG tablet Take 1 tablet (6.25 mg total) by mouth 2 (two) times daily with a meal. 180 tablet 3   dapagliflozin  propanediol (FARXIGA ) 10 MG TABS tablet Take 1 tablet (10 mg total) by mouth daily. 90 tablet 1   furosemide  (LASIX ) 20 MG tablet Take 1 tablet (20 mg total) by mouth daily. 90 tablet 3   potassium chloride  (KLOR-CON ) 10 MEQ tablet Take 1 tablet (10 mEq total) by mouth daily. 90 tablet 3   sacubitril -valsartan  (ENTRESTO ) 49-51 MG Take 1 tablet by mouth 2 (two) times daily. 180 tablet 3   spironolactone  (ALDACTONE ) 25 MG tablet Take 1 tablet (25 mg total) by mouth daily. 90 tablet 3   No current facility-administered medications for this encounter.   No Known Allergies  Social History   Socioeconomic History   Marital status: Single    Spouse name: Not on file   Number of  children: Not on file   Years of education: Not on file   Highest education level: Not on file  Occupational History   Occupation: unemployed  Tobacco Use   Smoking status: Every Day    Current packs/day: 0.25    Average packs/day: 0.3 packs/day for 5.0 years (1.3 ttl pk-yrs)    Types: Cigarettes   Smokeless tobacco: Never  Substance and Sexual Activity   Alcohol use: Yes    Alcohol/week: 1.0 standard drink of alcohol    Types: 1 Glasses of wine per week    Comment: occaisionally; once every two  weeks    Drug use: Yes    Types: Marijuana, Cocaine    Comment: pt reports smoking everyday   Sexual activity: Yes    Birth control/protection: None  Other Topics Concern   Not on file  Social History Narrative   Not on file   Social Drivers of Health   Financial Resource Strain: Not on file  Food Insecurity: Not on file  Transportation Needs: Not on file  Physical Activity: Not on file  Stress: Not on file  Social Connections: Not on file  Intimate Partner Violence: Not on file   Family History  Problem Relation Age of Onset   Hypertension Mother    BP 118/82   Pulse 69   Wt 60.5 kg (133 lb 6.4 oz)   SpO2 100%   BMI 20.28 kg/m   Wt Readings from Last 3 Encounters:  11/10/23 60.5 kg (133 lb 6.4 oz)  08/10/23 57.2 kg (126 lb)  03/11/23 60.3 kg (133 lb)   PHYSICAL EXAM: General:  NAD. No resp difficulty, walked into clinic HEENT: Normal Neck: Supple. No JVD. Carotids 2+ bilat; no bruits. No lymphadenopathy, + thryomegaly  Cor: PMI nondisplaced. Regular rate & rhythm. No rubs, gallops or murmurs. Lungs: Clear Abdomen: Soft, nontender, nondistended. No hepatosplenomegaly. No bruits or masses. Good bowel sounds. Extremities: No cyanosis, clubbing, rash, edema Neuro: Alert & oriented x 3, cranial nerves grossly intact. Moves all 4 extremities w/o difficulty. Affect pleasant.  ASSESSMENT & PLAN: 1. Chronic systolic CHF: Nonischemic cardiomyopathy.  Echo 1/23 EF 40-45% with inferior/inferoseptal HK, low normal RV function, mild-moderate MR, elevated AoV gradient at 11 mmHg but valve opens normally, dilated IVC, pleural effusion.  She has history of cocaine and ETOH abuse. Also h/o uncontrolled HTN.  No FH of CMP that she knows of.  She was found to have hyperthyroidism which may play a role in both cardiomyopathy and HTN.  Cath in 12/22 showed low filling pressures and no CAD.  Echo 4/23 showed recovery of EF to 55% with normal diastolic function and normal RV.  Echo 4/24 EF  55-60%, normal RV, mild MR, IVC dilated. New LBBB on ECG 07/2023 (had in past but resolved). Echo 12/24 showed EF 50-55%, IVC dilated. NYHA I-II, Not significantly volume overloaded on exam but ReDs marginal at 35%.   - Increase Lasix  to 40 mg on MWF, continue 20 mg every other day. BMETand BNP today, repeat BMET in 10-14 days - Increase KCL to 20 daily. - Continue Entresto  49/51 bid.   - Continue Farxiga  10 mg daily. - Continue spironolactone  25 mg daily.  - Continue Coreg  at 6.25 mg BID - Continue to avoid cocaine and ETOH.  2. HTN: Long-standing.  Suspect hyperthyroidism played a role. Renal artery dopplers did not show FMD.  Renin/aldosterone levels unrevealing.  BP now controlled.   3. Fe deficiency anemia: Has been treated with feraheme . Repeat iron  panel today. 4. Hyperthyroidism => hypothyroidism: TSH markedly low during 12/22 admit, also with tachycardia/weight loss/cardiomyopathy.  Thyroid  US  consistent with thyroiditis. Patient started on methimazole .  4/23 thyroid  indices showed elevated TSH and low free T4.  Methimazole  stopped.  TSH was normal in 1/24.  - followed by endocrinology 5. Snoring: arrange sleep study   Follow up in 3 months with APP.   Arlice [facility] FNP-BC 11/10/2023  [facility] [place] [address] [facility] [place] [address] [phone] (office) [phone] (fax)

## 2023-11-09 ENCOUNTER — Telehealth (HOSPITAL_COMMUNITY): Payer: Self-pay

## 2023-11-09 NOTE — Telephone Encounter (Signed)
 Called to confirm/remind patient of their appointment at the [facility] on 11/10/23.   Patient reminded to bring all medications and/or complete list.  Confirmed patient has transportation. Gave directions, instructed to utilize valet parking.  Confirmed appointment prior to ending call.

## 2023-11-10 ENCOUNTER — Ambulatory Visit (HOSPITAL_COMMUNITY)
Admission: RE | Admit: 2023-11-10 | Discharge: 2023-11-10 | Disposition: A | Discharge status: Home / Self Care | Payer: Medicaid Other | Source: Ambulatory Visit | Attending: Family Medicine | Admitting: Family Medicine

## 2023-11-10 ENCOUNTER — Encounter (HOSPITAL_COMMUNITY): Payer: Self-pay

## 2023-11-10 VITALS — BP 118/82 | HR 69 | Wt 133.4 lb

## 2023-11-10 DIAGNOSIS — I428 Other cardiomyopathies: Secondary | ICD-10-CM | POA: Insufficient documentation

## 2023-11-10 DIAGNOSIS — F1010 Alcohol abuse, uncomplicated: Secondary | ICD-10-CM | POA: Insufficient documentation

## 2023-11-10 DIAGNOSIS — E039 Hypothyroidism, unspecified: Secondary | ICD-10-CM | POA: Diagnosis not present

## 2023-11-10 DIAGNOSIS — I5022 Chronic systolic (congestive) heart failure: Secondary | ICD-10-CM

## 2023-11-10 DIAGNOSIS — I110 Hypertensive heart disease with heart failure: Secondary | ICD-10-CM | POA: Insufficient documentation

## 2023-11-10 DIAGNOSIS — R000 Tachycardia, unspecified: Secondary | ICD-10-CM | POA: Diagnosis not present

## 2023-11-10 DIAGNOSIS — E0590 Thyrotoxicosis, unspecified without thyrotoxic crisis or storm: Secondary | ICD-10-CM

## 2023-11-10 DIAGNOSIS — R0683 Snoring: Secondary | ICD-10-CM | POA: Diagnosis not present

## 2023-11-10 DIAGNOSIS — D509 Iron deficiency anemia, unspecified: Secondary | ICD-10-CM | POA: Diagnosis not present

## 2023-11-10 DIAGNOSIS — I447 Left bundle-branch block, unspecified: Secondary | ICD-10-CM | POA: Diagnosis not present

## 2023-11-10 DIAGNOSIS — I10 Essential (primary) hypertension: Secondary | ICD-10-CM | POA: Diagnosis not present

## 2023-11-10 DIAGNOSIS — Z79899 Other long term (current) drug therapy: Secondary | ICD-10-CM | POA: Insufficient documentation

## 2023-11-10 LAB — BASIC METABOLIC PANEL
Anion gap: 5 (ref 5–15)
BUN: 11 mg/dL (ref 6–20)
CO2: 27 mmol/L (ref 22–32)
Calcium: 8.9 mg/dL (ref 8.9–10.3)
Chloride: 107 mmol/L (ref 98–111)
Creatinine, Ser: 0.94 mg/dL (ref 0.44–1.00)
GFR, Estimated: >60 mL/min (ref >60)
Glucose, Bld: 90 mg/dL (ref 70–99)
Potassium: 4 mmol/L (ref 3.5–5.1)
Sodium: 139 mmol/L (ref 135–145)

## 2023-11-10 LAB — IRON AND TIBC
Iron: 122 ug/dL (ref 28–170)
Saturation Ratios: 35 % — ABNORMAL HIGH (ref 10.4–31.8)
TIBC: 344 ug/dL (ref 250–450)
UIBC: 222 ug/dL

## 2023-11-10 LAB — FERRITIN: Ferritin: 13 ng/mL (ref 11–307)

## 2023-11-10 LAB — CBC
HCT: 32.6 % — ABNORMAL LOW (ref 36.0–46.0)
Hemoglobin: 10.4 g/dL — ABNORMAL LOW (ref 12.0–15.0)
MCH: 30.4 pg (ref 26.0–34.0)
MCHC: 31.9 g/dL (ref 30.0–36.0)
MCV: 95.3 fL (ref 80.0–100.0)
Platelets: 272 10*3/uL (ref 150–400)
RBC: 3.42 MIL/uL — ABNORMAL LOW (ref 3.87–5.11)
RDW: 15.1 % (ref 11.5–15.5)
WBC: 6 10*3/uL (ref 4.0–10.5)
nRBC: 0 % (ref 0.0–0.2)

## 2023-11-10 LAB — BRAIN NATRIURETIC PEPTIDE: B Natriuretic Peptide: 102.9 pg/mL — ABNORMAL HIGH (ref 0.0–100.0)

## 2023-11-10 MED ORDER — POTASSIUM CHLORIDE ER 10 MEQ PO TBCR: 20.0000 meq | EXTENDED_RELEASE_TABLET | Freq: Every day | ORAL | 3 refills | Status: DC

## 2023-11-10 MED ORDER — FUROSEMIDE 20 MG PO TABS: ORAL_TABLET | ORAL | 3 refills | Status: DC

## 2023-11-10 NOTE — Progress Notes (Signed)
 ReDS Vest / Clip - 11/10/23 1000       ReDS Vest / Clip   Station Marker C    Ruler Value 21.5    ReDS Value Range Low volume    ReDS Actual Value 35

## 2023-11-10 NOTE — Patient Instructions (Addendum)
 Thank you for coming in today  If you had labs drawn today, any labs that are abnormal the clinic will call you No news is good news  You have been order for sleep study [facility] will call you to make appointment    Medications: Increase Potassium to 20 meq 2 tablets daily Increase Lasix  to 40 mg every Monday Wednesday Friday  Follow up appointments:  Your physician recommends that you return for lab work in: 10-14 days   Your physician recommends that you schedule a follow-up appointment in:  3 months in clinic    Do the following things EVERYDAY: Weigh yourself in the morning before breakfast. Write it down and keep it in a log. Take your medicines as prescribed Eat low salt foods--Limit salt (sodium) to 2000 mg per day.  Stay as active as you can everyday Limit all fluids for the day to less than 2 liters   At the [facility], you and your health needs are our priority. As part of our continuing mission to provide you with exceptional heart care, we have created designated Provider Care Teams. These Care Teams include your primary Cardiologist (physician) and Advanced Practice Providers (APPs- Physician Assistants and Nurse Practitioners) who all work together to provide you with the care you need, when you need it.   You may see any of the following providers on your designated Care Team at your next follow up: Dr Jules Oar Dr Peder Bourdon Dr. Mimi Alt, NP Ruddy Corral, [place] [facility] [place], [place] Dennise Fitz, NP Luster Salters, PharmD   Please be sure to bring in all your medications bottles to every appointment.    Thank you for choosing [facility]  If you have any questions or concerns before your next appointment please send us  a message through [place] or call our office at [phone].    TO LEAVE A MESSAGE FOR THE NURSE SELECT OPTION 2, PLEASE  LEAVE A MESSAGE INCLUDING: YOUR NAME DATE OF BIRTH CALL BACK NUMBER REASON FOR CALL**this is important as we prioritize the call backs  YOU WILL RECEIVE A CALL BACK THE SAME DAY AS LONG AS YOU CALL BEFORE 4:00 PM

## 2023-11-11 ENCOUNTER — Ambulatory Visit: Payer: Self-pay

## 2023-11-11 ENCOUNTER — Telehealth: Payer: Medicaid Other | Admitting: Family Medicine

## 2023-11-11 NOTE — Telephone Encounter (Signed)
Noted  

## 2023-11-11 NOTE — Progress Notes (Signed)
The patient no-showed for appointment despite this provider sending direct link, reaching out via phone with no response and waiting for at least 10 minutes from appointment time for patient to join. They will be marked as a NS for this appointment/time.    M , NP    

## 2023-11-11 NOTE — Telephone Encounter (Signed)
  Chief Complaint: Unable to breath through nose d/t congestion Symptoms: above Frequency:  Pertinent Negatives: Patient denies fever, sore throat any other s/s Disposition: [] ED /[] Urgent Care (no appt availability in office) / [] Appointment(In office/virtual)/ [x]  [facility]/ [] Home Care/ [] Refused Recommended Disposition /[]  Mobile Bus/ []  Follow-up with PCP Additional Notes: Pt is having trouble breathing through her nose d/t sinus congestion. She is unable to clear congestion. Pt has heart issues and is concerned about taking OTC medications. Advised that pharmacist is very good at choosing otc medications with knowing pt's other medical information.  Pt would like to be seen. No appts in office. Pt scheduled VV for this afternoon. Pt will try to humidify air and increase fluids.   Summary: poss sinus inf   Pt has possible sinus infection- head stopped up, runny nose.  Pt states w/her heart condition, she cannot take just anything.  Calling for appt. No appt ntil 2/05.pt asked to be scheduled for that appt, but wants to be seen sooner if possible        Reason for Disposition  [1] Sinus congestion (pressure, fullness) AND [2] present > 10 days  Answer Assessment - Initial Assessment Questions 1. LOCATION: "Where does it hurt?"      Sinus 2. ONSET: "When did the sinus pain start?"  (e.g., hours, days)      unsure 3. SEVERITY: "How bad is the pain?"   (Scale 1-10; mild, moderate or severe)   - MILD (1-3): doesn't interfere with normal activities    - MODERATE (4-7): interferes with normal activities (e.g., work or school) or awakens from sleep   - SEVERE (8-10): excruciating pain and patient unable to do any normal activities        Mild moderate 4. RECURRENT SYMPTOM: "Have you ever had sinus problems before?" If Yes, ask: "When was the last time?" and "What happened that time?"      na 5. NASAL CONGESTION: "Is the nose blocked?" If Yes, ask: "Can you open it  or must you breathe through your mouth?"     yes 6. NASAL DISCHARGE: "Do you have discharge from your nose?" If so ask, "What color?"     Not much 7. FEVER: "Do you have a fever?" If Yes, ask: "What is it, how was it measured, and when did it start?"      no 8. OTHER SYMPTOMS: "Do you have any other symptoms?" (e.g., sore throat, cough, earache, difficulty breathing)     No other s/s  Protocols used: Sinus Pain or Congestion-A-AH

## 2023-11-24 ENCOUNTER — Ambulatory Visit (HOSPITAL_COMMUNITY)
Admission: RE | Admit: 2023-11-24 | Discharge: 2023-11-24 | Disposition: A | Discharge status: Home / Self Care | Payer: Medicaid Other | Source: Ambulatory Visit | Attending: Cardiology | Admitting: Cardiology

## 2023-11-24 DIAGNOSIS — I5022 Chronic systolic (congestive) heart failure: Secondary | ICD-10-CM | POA: Insufficient documentation

## 2023-11-24 LAB — BASIC METABOLIC PANEL
Anion gap: 6 (ref 5–15)
BUN: 14 mg/dL (ref 6–20)
CO2: 26 mmol/L (ref 22–32)
Calcium: 9.2 mg/dL (ref 8.9–10.3)
Chloride: 105 mmol/L (ref 98–111)
Creatinine, Ser: 0.93 mg/dL (ref 0.44–1.00)
GFR, Estimated: >60 mL/min (ref >60)
Glucose, Bld: 142 mg/dL — ABNORMAL HIGH (ref 70–99)
Potassium: 3.7 mmol/L (ref 3.5–5.1)
Sodium: 137 mmol/L (ref 135–145)

## 2023-12-01 ENCOUNTER — Ambulatory Visit: Payer: Medicaid Other | Admitting: Physician Assistant

## 2024-02-04 NOTE — Progress Notes (Incomplete)
 ADVANCED HF CLINIC NOTE   Primary Care: Hoy Register, MD HF Cardiologist: Dr. Shirlee Latch  CC: F/u for HFimEF   HPI: Judith Lowe is a 43 y.o. female with history of ETOH and substance abuse (cocaine), tobacco user and new diagnosis of HTN, IDA, hyperthyroidism, and systolic heart failure.  She was admitted 12/22 with new acute heart failure, hypoxia requiring BiPap and hypertensive urgency. She was diuresed with IV Lasix and started on nitro gtt. Echo showed EF 40%, RV normal, mild MR and mild pericardial effusion. UDS + cocaine/THC. ECG with new LBBB. Underwent R/LHC showing no CAD and low filling pressures. She was started on GDMT. TSH low with high free T4 suggestive of hyperthyroidism, she was started on methimazole. Discharge weight 113 lbs.  Echo in 4/23 showed improved EF 55%, normal diastolic function, mild LVH, normal RV, small pericardial effusion, dilated IVC, PASP 35 mmHg.   4/23 labs showed TSH now elevated and free T4 low.  Methimazole was stopped.  8/23 labs showed normal TSH and free T3 with mildly decreased free T4.    Echo 4/24 EF 55-60%, normal RV, mild MR   Follow up 10/24, had a new LBBB on ECG and endorsed fatigue. Echo arranged 12/24, showed EF 50-55%, mildly reduced from previous.   She presents today for routine f/u. ***    No cocaine/ETOH or tobacco x 1 year. ***   ECG (personally reviewed): ***   ReDs Clip today: ***   Labs (1/23): K 3.1, creatinine 0.83 Labs (3/23): K 3.7, creatinine 1.23 Labs (4/23): BNP 18, K 4.6, creatinine 1.19, TSH 92, free T4 < 0.25 Labs (8/23): TSH normal, free T3 normal, free T4 mildly decreased.  Labs (1/24): K 4.5, creatinine 0.91, TSH normal Labs (5/24): K 3.5, creatinine 0.78, BNP 125 Labs (10/24): K 3.8, creatinine 0.86  PMH: 1. H/o ETOH abuse 2. H/o cocaine abuse 3. Hyperthyroidism => hypothyroidism 4. HTN 5. Fe deficiency anemia 6. Chronic systolic CHF: Nonischemic cardiomyopathy.  - Echo (12/22): EF 40%, RV  normal, mild MR and mild pericardial effusion - LHC/RHC (12/22): No significant CAD; mean RA 1, PA 24/5, mean PCWP 5, CI 3.8 - Echo (4/23): EF 55%, normal diastolic function, mild LVH, normal RV, small  - Echo (4/24): EF 55-60%, normal RV, mild MR, IVC dilated - Echo (12/24): EF 50-55%, IVC dilated.  Current Outpatient Medications  Medication Sig Dispense Refill   carvedilol (COREG) 6.25 MG tablet Take 1 tablet (6.25 mg total) by mouth 2 (two) times daily with a meal. 180 tablet 3   dapagliflozin propanediol (FARXIGA) 10 MG TABS tablet Take 1 tablet (10 mg total) by mouth daily. 90 tablet 1   furosemide (LASIX) 20 MG tablet Take 2 tablets (40 mg total) by mouth every Monday, Wednesday, and Friday AND 1 tablet (20 mg total) every Tuesday, Thursday, Saturday, and Sunday. 270 tablet 3   potassium chloride (KLOR-CON) 10 MEQ tablet Take 2 tablets (20 mEq total) by mouth daily. 180 tablet 3   sacubitril-valsartan (ENTRESTO) 49-51 MG Take 1 tablet by mouth 2 (two) times daily. 180 tablet 3   spironolactone (ALDACTONE) 25 MG tablet Take 1 tablet (25 mg total) by mouth daily. 90 tablet 3   No current facility-administered medications for this visit.   No Known Allergies  Social History   Socioeconomic History   Marital status: Single    Spouse name: Not on file   Number of children: Not on file   Years of education: Not on file  Highest education level: Not on file  Occupational History   Occupation: unemployed  Tobacco Use   Smoking status: Every Day    Current packs/day: 0.25    Average packs/day: 0.3 packs/day for 5.0 years (1.3 ttl pk-yrs)    Types: Cigarettes   Smokeless tobacco: Never  Substance and Sexual Activity   Alcohol use: Yes    Alcohol/week: 1.0 standard drink of alcohol    Types: 1 Glasses of wine per week    Comment: occaisionally; once every two weeks    Drug use: Yes    Types: Marijuana, Cocaine    Comment: pt reports smoking everyday   Sexual activity: Yes     Birth control/protection: None  Other Topics Concern   Not on file  Social History Narrative   Not on file   Social Drivers of Health   Financial Resource Strain: Not on file  Food Insecurity: Not on file  Transportation Needs: Not on file  Physical Activity: Not on file  Stress: Not on file  Social Connections: Not on file  Intimate Partner Violence: Not on file   Family History  Problem Relation Age of Onset   Hypertension Mother    There were no vitals taken for this visit.  Wt Readings from Last 3 Encounters:  11/10/23 60.5 kg (133 lb 6.4 oz)  08/10/23 57.2 kg (126 lb)  03/11/23 60.3 kg (133 lb)    PHYSICAL EXAM: General:  Well appearing. No respiratory difficulty HEENT: normal Neck: supple. no JVD. Carotids 2+ bilat; no bruits. No lymphadenopathy or thyromegaly appreciated. Cor: PMI nondisplaced. Regular rate & rhythm. No rubs, gallops or murmurs. Lungs: clear Abdomen: soft, nontender, nondistended. No hepatosplenomegaly. No bruits or masses. Good bowel sounds. Extremities: no cyanosis, clubbing, rash, edema Neuro: alert & oriented x 3, cranial nerves grossly intact. moves all 4 extremities w/o difficulty. Affect pleasant.   ASSESSMENT & PLAN: 1. Chronic systolic CHF: Nonischemic cardiomyopathy.  Echo 1/23 EF 40-45% with inferior/inferoseptal HK, low normal RV function, mild-moderate MR, elevated AoV gradient at 11 mmHg but valve opens normally, dilated IVC, pleural effusion.  She has history of cocaine and ETOH abuse. Also h/o uncontrolled HTN.  No FH of CMP that she knows of.  She was found to have hyperthyroidism which may play a role in both cardiomyopathy and HTN.  Cath in 12/22 showed low filling pressures and no CAD.  Echo 4/23 showed recovery of EF to 55% with normal diastolic function and normal RV.  Echo 4/24 EF 55-60%, normal RV, mild MR, IVC dilated. New LBBB on ECG 07/2023 (had in past but resolved). Echo 12/24 showed EF 50-55%, IVC dilated.   - NYHA Class  *** Volume ***  - Continue Lasix 40 mg on MWF, continue 20 mg every other day.  +KCL 20 mEq daily  - Continue Entresto 49/51 bid.   - Continue Farxiga 10 mg daily. - Continue spironolactone 25 mg daily.  - Continue Coreg at 6.25 mg BID - Continue to avoid cocaine and ETOH.    2. HTN: Long-standing. Renal artery dopplers did not show FMD.  Renin/aldosterone levels unrevealing.  Suspect hyperthyroidism played a role - controlled on current regimen  - Check BMP today   3. Fe deficiency anemia: Has been treated with feraheme. Repeat iron panel today.  4. Hyperthyroidism => hypothyroidism: TSH markedly low during 12/22 admit, also with tachycardia/weight loss/cardiomyopathy.  Thyroid US consistent with thyroiditis. Patient started on methimazole.  4/23 thyroid indices showed elevated TSH and low free T4.  Methimazole stopped.  TSH was normal in 1/24.  - followed by endocrinology  5. Snoring: arrange sleep study ***   Knute Neu  02/04/2024  [facility] [address] [facility] [place] [address] [phone] (office) [phone] (fax)

## 2024-02-07 ENCOUNTER — Telehealth (HOSPITAL_COMMUNITY): Payer: Self-pay

## 2024-02-07 NOTE — Telephone Encounter (Signed)
 Called to confirm/remind patient of their appointment at the [facility] on 02/08/24.   Appointment:   [] Confirmed  [] Left mess   [x] No answer/No voice mail  [] Phone not in service

## 2024-02-08 ENCOUNTER — Encounter (HOSPITAL_COMMUNITY): Payer: Self-pay

## 2024-02-08 ENCOUNTER — Ambulatory Visit (HOSPITAL_BASED_OUTPATIENT_CLINIC_OR_DEPARTMENT_OTHER): Payer: Medicaid Other | Admitting: Cardiology

## 2024-02-08 ENCOUNTER — Ambulatory Visit (HOSPITAL_COMMUNITY)
Admission: RE | Admit: 2024-02-08 | Discharge: 2024-02-08 | Disposition: A | Discharge status: Home / Self Care | Payer: Medicaid Other | Source: Ambulatory Visit | Attending: Cardiology | Admitting: Cardiology

## 2024-02-08 VITALS — BP 138/84 | HR 69 | Wt 129.4 lb

## 2024-02-08 DIAGNOSIS — I160 Hypertensive urgency: Secondary | ICD-10-CM | POA: Insufficient documentation

## 2024-02-08 DIAGNOSIS — I3139 Other pericardial effusion (noninflammatory): Secondary | ICD-10-CM | POA: Insufficient documentation

## 2024-02-08 DIAGNOSIS — R0683 Snoring: Secondary | ICD-10-CM | POA: Diagnosis not present

## 2024-02-08 DIAGNOSIS — I110 Hypertensive heart disease with heart failure: Secondary | ICD-10-CM | POA: Insufficient documentation

## 2024-02-08 DIAGNOSIS — E039 Hypothyroidism, unspecified: Secondary | ICD-10-CM | POA: Diagnosis not present

## 2024-02-08 DIAGNOSIS — E0590 Thyrotoxicosis, unspecified without thyrotoxic crisis or storm: Secondary | ICD-10-CM | POA: Insufficient documentation

## 2024-02-08 DIAGNOSIS — F17210 Nicotine dependence, cigarettes, uncomplicated: Secondary | ICD-10-CM | POA: Insufficient documentation

## 2024-02-08 DIAGNOSIS — D509 Iron deficiency anemia, unspecified: Secondary | ICD-10-CM | POA: Diagnosis not present

## 2024-02-08 DIAGNOSIS — R944 Abnormal results of kidney function studies: Secondary | ICD-10-CM | POA: Insufficient documentation

## 2024-02-08 DIAGNOSIS — I5022 Chronic systolic (congestive) heart failure: Secondary | ICD-10-CM

## 2024-02-08 DIAGNOSIS — I428 Other cardiomyopathies: Secondary | ICD-10-CM | POA: Insufficient documentation

## 2024-02-08 DIAGNOSIS — I447 Left bundle-branch block, unspecified: Secondary | ICD-10-CM | POA: Diagnosis not present

## 2024-02-08 DIAGNOSIS — Z7984 Long term (current) use of oral hypoglycemic drugs: Secondary | ICD-10-CM | POA: Diagnosis not present

## 2024-02-08 DIAGNOSIS — I5021 Acute systolic (congestive) heart failure: Secondary | ICD-10-CM | POA: Diagnosis not present

## 2024-02-08 DIAGNOSIS — Z79899 Other long term (current) drug therapy: Secondary | ICD-10-CM | POA: Insufficient documentation

## 2024-02-08 LAB — BASIC METABOLIC PANEL WITH GFR
Anion gap: 8 (ref 5–15)
BUN: 7 mg/dL (ref 6–20)
CO2: 27 mmol/L (ref 22–32)
Calcium: 10 mg/dL (ref 8.9–10.3)
Chloride: 105 mmol/L (ref 98–111)
Creatinine, Ser: 0.83 mg/dL (ref 0.44–1.00)
GFR, Estimated: >60 mL/min (ref >60)
Glucose, Bld: 76 mg/dL (ref 70–99)
Potassium: 4.2 mmol/L (ref 3.5–5.1)
Sodium: 140 mmol/L (ref 135–145)

## 2024-02-08 LAB — IRON AND TIBC
Iron: 147 ug/dL (ref 28–170)
Saturation Ratios: 38 % — ABNORMAL HIGH (ref 10.4–31.8)
TIBC: 386 ug/dL (ref 250–450)
UIBC: 239 ug/dL

## 2024-02-08 LAB — FERRITIN: Ferritin: 10 ng/mL — ABNORMAL LOW (ref 11–307)

## 2024-02-08 LAB — BRAIN NATRIURETIC PEPTIDE: B Natriuretic Peptide: 150.7 pg/mL — ABNORMAL HIGH (ref 0.0–100.0)

## 2024-02-08 MED ORDER — ENTRESTO 97-103 MG PO TABS: 1.0000 | ORAL_TABLET | Freq: Two times a day (BID) | ORAL | 11 refills | Status: AC

## 2024-02-08 NOTE — Patient Instructions (Signed)
 Increase Entresto to 97/103 mg twice daily - updated Rx sent. Take an extra potassium 20 meq today. Labs today  - will call you if abnormal. Referral sent for sleep study - they will call you to schedule. Return to Heart Failure APP Clinic in 4 - 6 weeks. See below. Return to see Dr. Mitzie Anda in 6 months. CALL US  AT [phone] IN SEPTEMBER TO SCHEDULE THIS APPOINTMENT. 7.   Please call us  at [phone] if any questions or concerns prior to your next visit.

## 2024-02-08 NOTE — Progress Notes (Signed)
 ReDS Vest / Clip - 02/08/24 1100       ReDS Vest / Clip   Station Marker C    Ruler Value 20    ReDS Value Range Moderate volume overload    ReDS Actual Value 38

## 2024-02-08 NOTE — Addendum Note (Signed)
 Encounter addended by: Eliam Snapp M, CMA on: 02/08/2024 11:23 AM  Actions taken: Visit diagnoses modified, Order list changed, Diagnosis association updated, Flowsheet accepted, Clinical Note Signed, Charge Capture section accepted

## 2024-03-02 ENCOUNTER — Telehealth (HOSPITAL_COMMUNITY): Payer: Self-pay

## 2024-03-02 NOTE — Telephone Encounter (Signed)
-----   Message from [place] sent at 02/24/2024  5:11 PM EDT ----- Ferritin < 100 c/w Iron deficiency anemia. Refer for IV iron.

## 2024-03-02 NOTE — Telephone Encounter (Signed)
 Spoke with patient regarding the following results. Patient made aware and patient verbalized understanding.   Patient would like to do iron infusion- message sent to pre cert and to arrange infusion.

## 2024-03-17 NOTE — Progress Notes (Incomplete)
 ADVANCED HF CLINIC NOTE  Primary Care: Joaquin Mulberry, MD HF Cardiologist: Dr. Mitzie Anda  Reason for Visit: Heart Failure Follow-up HPI: Judith Lowe is a 43 y.o. female with history of ETOH and substance abuse (cocaine), tobacco user and new diagnosis of HTN, IDA, hyperthyroidism, and systolic heart failure.  She was admitted 12/22 with new acute heart failure, hypoxia requiring BiPap and hypertensive urgency. She was diuresed with IV Lasix  and started on nitro gtt. Echo showed EF 40%, RV normal, mild MR and mild pericardial effusion. UDS + cocaine/THC. ECG with new LBBB. Underwent R/LHC showing no CAD and low filling pressures. She was started on GDMT. TSH low with high free T4 suggestive of hyperthyroidism, she was started on methimazole . Discharge weight 113 lbs.  Echo in 4/23 showed improved EF 55%, normal diastolic function, mild LVH, normal RV, small pericardial effusion, dilated IVC, PASP 35 mmHg.   4/23 labs showed TSH now elevated and free T4 low.  Methimazole  was stopped.  8/23 labs showed normal TSH and free T3 with mildly decreased free T4.    Echo 4/24 EF 55-60%, normal RV, mild MR   Follow up 10/24, had a new LBBB on ECG and endorsed fatigue. Echo arranged 12/24, showed EF 50-55%, mildly reduced from previous.   4/15  presents today for routine f/u. Doing well but c/w NYHA Class II-III symptoms, stable. Denies CP. Reports full med compliance. No side effects. BP 138/84. Wt up 7 lb since previous visit. ReDs 38%   Denies any further drug use.   She returns today for heart failure follow up. Overall feeling ***. NYHA ***. Reports {Symptoms; cardiac:12860::"dyspnea","fatigue"}. Denies {Symptoms; cardiac:12860::"chest pain","dyspnea","fatigue","near-syncope","orthopnea","palpitations","dizziness","abnormal bleeding"}. Able to perform ADLs. Appetite okay. Weight at home ***. BP at home***. Compliant with all medications.   PMH: 1. H/o ETOH abuse 2. H/o cocaine abuse 3.  Hyperthyroidism => hypothyroidism 4. HTN 5. Fe deficiency anemia 6. Chronic systolic CHF: Nonischemic cardiomyopathy.  - Echo (12/22): EF 40%, RV normal, mild MR and mild pericardial effusion - LHC/RHC (12/22): No significant CAD; mean RA 1, PA 24/5, mean PCWP 5, CI 3.8 - Echo (4/23): EF 55%, normal diastolic function, mild LVH, normal RV, small  - Echo (4/24): EF 55-60%, normal RV, mild MR, IVC dilated - Echo (12/24): EF 50-55%, IVC dilated.  Current Outpatient Medications  Medication Sig Dispense Refill   carvedilol  (COREG ) 6.25 MG tablet Take 1 tablet (6.25 mg total) by mouth 2 (two) times daily with a meal. 180 tablet 3   dapagliflozin  propanediol (FARXIGA ) 10 MG TABS tablet Take 1 tablet (10 mg total) by mouth daily. 90 tablet 1   furosemide  (LASIX ) 20 MG tablet Take 2 tablets (40 mg total) by mouth every Monday, Wednesday, and Friday AND 1 tablet (20 mg total) every Tuesday, Thursday, Saturday, and Sunday. 270 tablet 3   potassium chloride  (KLOR-CON ) 10 MEQ tablet Take 2 tablets (20 mEq total) by mouth daily. 180 tablet 3   sacubitril -valsartan  (ENTRESTO ) 97-103 MG Take 1 tablet by mouth 2 (two) times daily. 60 tablet 11   spironolactone  (ALDACTONE ) 25 MG tablet Take 1 tablet (25 mg total) by mouth daily. 90 tablet 3   No current facility-administered medications for this visit.   No Known Allergies  Social History   Socioeconomic History   Marital status: Single    Spouse name: Not on file   Number of children: Not on file   Years of education: Not on file   Highest education level: Not on file  Occupational History  Occupation: unemployed  Tobacco Use   Smoking status: Every Day    Current packs/day: 0.25    Average packs/day: 0.3 packs/day for 5.0 years (1.3 ttl pk-yrs)    Types: Cigarettes   Smokeless tobacco: Never  Substance and Sexual Activity   Alcohol use: Yes    Alcohol/week: 1.0 standard drink of alcohol    Types: 1 Glasses of wine per week    Comment:  occaisionally; once every two weeks    Drug use: Yes    Types: Marijuana, Cocaine    Comment: pt reports smoking everyday   Sexual activity: Yes    Birth control/protection: None  Other Topics Concern   Not on file  Social History Narrative   Not on file   Social Drivers of Health   Financial Resource Strain: Not on file  Food Insecurity: Not on file  Transportation Needs: Not on file  Physical Activity: Not on file  Stress: Not on file  Social Connections: Not on file  Intimate Partner Violence: Not on file   Family History  Problem Relation Age of Onset   Hypertension Mother    There were no vitals taken for this visit.  Wt Readings from Last 3 Encounters:  02/08/24 58.7 kg (129 lb 6.4 oz)  11/10/23 60.5 kg (133 lb 6.4 oz)  08/10/23 57.2 kg (126 lb)    PHYSICAL EXAM: ReDs 38%  General:  Well appearing. No respiratory difficulty HEENT: normal Neck: supple. JVD 9 cm. Carotids 2+ bilat; no bruits. No lymphadenopathy or thyromegaly appreciated. Cor: PMI nondisplaced. Regular rate & rhythm. No rubs, gallops or murmurs. Lungs: clear Abdomen: soft, nontender, nondistended. No hepatosplenomegaly. No bruits or masses. Good bowel sounds. Extremities: no cyanosis, clubbing, rash, edema Neuro: alert & oriented x 3, cranial nerves grossly intact. moves all 4 extremities w/o difficulty. Affect pleasant.  ECG (personally reviewed): not performed    ReDs reading: 38 >*** %, {Norm/abn:16337}  ASSESSMENT & PLAN: 1. Chronic systolic CHF: Nonischemic cardiomyopathy.  Echo 1/23 EF 40-45% with inferior/inferoseptal HK, low normal RV function, mild-moderate MR, elevated AoV gradient at 11 mmHg but valve opens normally, dilated IVC, pleural effusion.  She has history of cocaine and ETOH abuse. Also h/o uncontrolled HTN.  No FH of CMP that she knows of.  She was found to have hyperthyroidism which may play a role in both cardiomyopathy and HTN.  Cath in 12/22 showed low filling pressures  and no CAD.  Echo 4/23 showed recovery of EF to 55% with normal diastolic function and normal RV.  Echo 4/24 EF 55-60%, normal RV, mild MR, IVC dilated. New LBBB on ECG 07/2023 (had in past but resolved). Echo 12/24 showed EF 50-55%, IVC dilated.  - NYHA Class II-early III (chronic/stable), Wt up 7 lb since previous visit and ReDs elevated at 38%. Admits to high fluid intake.   - Continue Lasix  40 mg on MWF, continue 20 mg every other day.  Given elevated volume status instructed to take an extra 20 mg of Lasix  today  - Increase Entresto  to 97-103 mb bid - Continue Farxiga  10 mg daily. - Continue spironolactone  25 mg daily.  - Continue Coreg  6.25 mg BID - discussed fluid restriction - Reports abstinence from cocaine and ETOH.   2. HTN: Long-standing. Renal artery dopplers did not show FMD.  Renin/aldosterone levels unrevealing.  Suspect hyperthyroidism played a role - mildly elevated - increase Entresto  to 97-103  - other GDMT per above  - Check BMP today  - arrange sleep  study to r/o OSA   3. Fe deficiency anemia: Has been treated with feraheme . Reports exertional fatigue/ dyspnea  - check iron studies today   4. Hyperthyroidism => hypothyroidism: TSH markedly low during 12/22 admit, also with tachycardia/weight loss/cardiomyopathy.  Thyroid  US  consistent with thyroiditis. Patient started on methimazole .  4/23 thyroid  indices showed elevated TSH and low free T4.  Methimazole  stopped.  TSH was normal in 10/24.  - followed by endocrinology  5. Snoring: arrange sleep study   F/u w/ APP in 4-6 wk to reassess volume status/ repeat labs.   F/u w/ Dr. Mitzie Anda  in 6 months  Follow up in *** with ***  [place] Bryn Saline, NP 03/17/2024

## 2024-03-21 ENCOUNTER — Encounter (HOSPITAL_COMMUNITY)

## 2024-03-28 NOTE — Telephone Encounter (Signed)
Scheduled, Patient advised and verbalized understanding.

## 2024-03-30 ENCOUNTER — Other Ambulatory Visit (HOSPITAL_COMMUNITY): Payer: Self-pay | Admitting: Cardiology

## 2024-03-30 ENCOUNTER — Telehealth: Payer: Self-pay

## 2024-03-30 DIAGNOSIS — D509 Iron deficiency anemia, unspecified: Secondary | ICD-10-CM

## 2024-03-30 NOTE — Telephone Encounter (Signed)
 Auth Submission: NO AUTH NEEDED Site of care: Site of care: [facility] Payer: [place] [employer] [place] Medication & CPT/J Code(s) submitted: Feraheme  (ferumoxytol ) [phone] Route of submission (phone, fax, portal):  Phone # Fax # Auth type: Buy/Bill PB Units/visits requested: 510mg  x 2 doses Reference number:  Approval from: 03/30/24 to 06/30/24

## 2024-03-31 ENCOUNTER — Inpatient Hospital Stay (HOSPITAL_COMMUNITY): Admission: RE | Admit: 2024-03-31 | Source: Ambulatory Visit

## 2024-04-07 ENCOUNTER — Encounter (HOSPITAL_COMMUNITY)

## 2024-04-16 ENCOUNTER — Ambulatory Visit (HOSPITAL_BASED_OUTPATIENT_CLINIC_OR_DEPARTMENT_OTHER): Admitting: Cardiology

## 2024-10-05 ENCOUNTER — Telehealth (HOSPITAL_COMMUNITY): Payer: Self-pay

## 2024-10-05 NOTE — Telephone Encounter (Signed)
 Called to confirm/remind patient of their appointment at the [facility] on 10/06/24 8:30.   Appointment:   [] Confirmed  [] Left mess   [] No answer/No voice mail  [] VM Full/unable to leave message  [x] Phone not in service  Patient reminded to bring all medications and/or complete list.  Confirmed patient has transportation. Gave directions, instructed to utilize valet parking.

## 2024-10-06 ENCOUNTER — Ambulatory Visit (HOSPITAL_COMMUNITY)
Admission: RE | Admit: 2024-10-06 | Discharge: 2024-10-06 | Disposition: A | Source: Ambulatory Visit | Attending: Cardiology

## 2024-10-06 ENCOUNTER — Ambulatory Visit (HOSPITAL_COMMUNITY): Payer: Self-pay | Admitting: Cardiology

## 2024-10-06 ENCOUNTER — Encounter (HOSPITAL_COMMUNITY): Payer: Self-pay

## 2024-10-06 VITALS — BP 124/82 | HR 75 | Ht 69.0 in | Wt 125.2 lb

## 2024-10-06 DIAGNOSIS — I5022 Chronic systolic (congestive) heart failure: Secondary | ICD-10-CM | POA: Diagnosis not present

## 2024-10-06 LAB — BASIC METABOLIC PANEL WITH GFR
Anion gap: 7 (ref 5–15)
BUN: 10 mg/dL (ref 6–20)
CO2: 27 mmol/L (ref 22–32)
Calcium: 9.1 mg/dL (ref 8.9–10.3)
Chloride: 106 mmol/L (ref 98–111)
Creatinine, Ser: 0.73 mg/dL (ref 0.44–1.00)
GFR, Estimated: >60 mL/min (ref >60)
Glucose, Bld: 99 mg/dL (ref 70–99)
Potassium: 4.3 mmol/L (ref 3.5–5.1)
Sodium: 140 mmol/L (ref 135–145)

## 2024-10-06 LAB — BRAIN NATRIURETIC PEPTIDE: B Natriuretic Peptide: 14.4 pg/mL (ref 0.0–100.0)

## 2024-10-06 MED ORDER — POTASSIUM CHLORIDE ER 10 MEQ PO TBCR: 20.0000 meq | EXTENDED_RELEASE_TABLET | ORAL | 3 refills | Status: AC

## 2024-10-06 MED ORDER — FUROSEMIDE 20 MG PO TABS: 40.0000 mg | ORAL_TABLET | ORAL | 3 refills | Status: AC

## 2024-10-06 NOTE — Progress Notes (Signed)
 ReDS Vest / Clip - 10/06/24 0826       ReDS Vest / Clip   Station Marker C    Ruler Value 25    ReDS Value Range Low volume    ReDS Actual Value 31

## 2024-10-06 NOTE — Progress Notes (Signed)
 ADVANCED HF CLINIC NOTE   Primary Care: Delbert Clam, MD HF Cardiologist: Dr. Rolan  CC: F/u for HFimEF   HPI: Judith Lowe is a 43 y.o. female with history of ETOH and substance abuse (cocaine), tobacco user and new diagnosis of HTN, IDA, hyperthyroidism, and systolic heart failure.  She was admitted 12/22 with new acute heart failure, hypoxia requiring BiPap and hypertensive urgency. She was diuresed with IV Lasix  and started on nitro gtt. Echo showed EF 40%, RV normal, mild MR and mild pericardial effusion. UDS + cocaine/THC. ECG with new LBBB. Underwent R/LHC showing no CAD and low filling pressures. She was started on GDMT. TSH low with high free T4 suggestive of hyperthyroidism, she was started on methimazole . Discharge weight 113 lbs.  Echo in 4/23 showed improved EF 55%, normal diastolic function, mild LVH, normal RV, small pericardial effusion, dilated IVC, PASP 35 mmHg.   4/23 labs showed TSH now elevated and free T4 low.  Methimazole  was stopped.  8/23 labs showed normal TSH and free T3 with mildly decreased free T4.    Echo 4/24 EF 55-60%, normal RV, mild MR   Follow up 10/24, had a new LBBB on ECG and endorsed fatigue. Echo arranged 12/24, showed EF 50-55%, mildly reduced from previous.   She presents today for f/u. Not seen since 4/24. She reports doing well. Denies resting dyspnea. She is not SOB ambulating on flat surfaces but SOB ambulating stairs, this is chronic and has not worsened. No ischemic CP. No LEE. Reports full med compliance. Tolerating well w/o side effects. BP well controlled, 124/82. ReDs 31%.   She denies any further drug use.  ECG (personally reviewed): not performed    ReDs Clip today: 31%, normal   Labs (1/23): K 3.1, creatinine 0.83 Labs (3/23): K 3.7, creatinine 1.23 Labs (4/23): BNP 18, K 4.6, creatinine 1.19, TSH 92, free T4 < 0.25 Labs (8/23): TSH normal, free T3 normal, free T4 mildly decreased.  Labs (1/24): K 4.5, creatinine 0.91,  TSH normal Labs (5/24): K 3.5, creatinine 0.78, BNP 125 Labs (10/24): K 3.8, creatinine 0.86  PMH: 1. H/o ETOH abuse 2. H/o cocaine abuse 3. Hyperthyroidism => hypothyroidism 4. HTN 5. Fe deficiency anemia 6. Chronic systolic CHF: Nonischemic cardiomyopathy.  - Echo (12/22): EF 40%, RV normal, mild MR and mild pericardial effusion - LHC/RHC (12/22): No significant CAD; mean RA 1, PA 24/5, mean PCWP 5, CI 3.8 - Echo (4/23): EF 55%, normal diastolic function, mild LVH, normal RV, small  - Echo (4/24): EF 55-60%, normal RV, mild MR, IVC dilated - Echo (12/24): EF 50-55%, IVC dilated.  Current Outpatient Medications  Medication Sig Dispense Refill   carvedilol  (COREG ) 6.25 MG tablet Take 1 tablet (6.25 mg total) by mouth 2 (two) times daily with a meal. 180 tablet 3   dapagliflozin  propanediol (FARXIGA ) 10 MG TABS tablet Take 1 tablet (10 mg total) by mouth daily. 90 tablet 1   furosemide  (LASIX ) 20 MG tablet Take 2 tablets (40 mg total) by mouth every Monday, Wednesday, and Friday AND 1 tablet (20 mg total) every Tuesday, Thursday, Saturday, and Sunday. 270 tablet 3   potassium chloride  (KLOR-CON ) 10 MEQ tablet Take 2 tablets (20 mEq total) by mouth daily. 180 tablet 3   sacubitril -valsartan  (ENTRESTO ) 97-103 MG Take 1 tablet by mouth 2 (two) times daily. 60 tablet 11   spironolactone  (ALDACTONE ) 25 MG tablet Take 1 tablet (25 mg total) by mouth daily. 90 tablet 3   No current facility-administered  medications for this encounter.   No Known Allergies  Social History   Socioeconomic History   Marital status: Single    Spouse name: Not on file   Number of children: Not on file   Years of education: Not on file   Highest education level: Not on file  Occupational History   Occupation: unemployed  Tobacco Use   Smoking status: Every Day    Current packs/day: 0.25    Average packs/day: 0.3 packs/day for 5.0 years (1.3 ttl pk-yrs)    Types: Cigarettes   Smokeless tobacco: Never   Substance and Sexual Activity   Alcohol use: Yes    Alcohol/week: 1.0 standard drink of alcohol    Types: 1 Glasses of wine per week    Comment: occaisionally; once every two weeks    Drug use: Yes    Types: Marijuana, Cocaine    Comment: pt reports smoking everyday   Sexual activity: Yes    Birth control/protection: None  Other Topics Concern   Not on file  Social History Narrative   Not on file   Social Drivers of Health   Tobacco Use: High Risk (10/06/2024)   Patient History    Smoking Tobacco Use: Every Day    Smokeless Tobacco Use: Never    Passive Exposure: Not on file  Financial Resource Strain: Not on file  Food Insecurity: Not on file  Transportation Needs: Not on file  Physical Activity: Not on file  Stress: Not on file  Social Connections: Not on file  Intimate Partner Violence: Not on file  Depression (PHQ2-9): High Risk (10/30/2021)   Depression (PHQ2-9)    PHQ-2 Score: 18  Alcohol Screen: Not on file  Housing: Not on file  Utilities: Not on file  Health Literacy: Not on file   Family History  Problem Relation Age of Onset   Hypertension Mother    BP 124/82   Pulse 75   Ht 5' 9 (1.753 m)   Wt 56.8 kg (125 lb 3.2 oz)   SpO2 100%   BMI 18.49 kg/m   Wt Readings from Last 3 Encounters:  10/06/24 56.8 kg (125 lb 3.2 oz)  02/08/24 58.7 kg (129 lb 6.4 oz)  11/10/23 60.5 kg (133 lb 6.4 oz)   Physical Exam  ReDs 31%, normal  GENERAL: NAD Lungs- clear  CARDIAC:  JVP not elevated          Normal rate with regular rhythm. No MRG. No LEE   ABDOMEN: Soft, non-tender, non-distended.  EXTREMITIES: Warm and well perfused.  NEUROLOGIC: No obvious FND    ASSESSMENT & PLAN: 1. Chronic systolic CHF: Nonischemic cardiomyopathy.  Echo 1/23 EF 40-45% with inferior/inferoseptal HK, low normal RV function, mild-moderate MR, elevated AoV gradient at 11 mmHg but valve opens normally, dilated IVC, pleural effusion.  She has history of cocaine and ETOH abuse. Also  h/o uncontrolled HTN.  No FH of CMP that she knows of.  She was found to have hyperthyroidism which may play a role in both cardiomyopathy and HTN.  Cath in 12/22 showed low filling pressures and no CAD.  Echo 4/23 showed recovery of EF to 55% with normal diastolic function and normal RV.  Echo 4/24 EF 55-60%, normal RV, mild MR, IVC dilated. New LBBB on ECG 07/2023 (had in past but resolved). Echo 12/24 showed EF 50-55%, IVC dilated.  - NYHA Class II. Euvolemic on exam and ReDs, 31%  - Continue Lasix  alternating between 20 and 40 mg every other day.  Check BMP/BNP today    - Continue Entresto  97-103 mg bid  - Continue Farxiga  10 mg daily  - Continue Spironolactone  25 mg daily  - Continue Coreg  6.25 mg bid  - Update Echo next visit   2. HTN: Long-standing. Renal artery dopplers did not show FMD.  Renin/aldosterone levels unrevealing.  Suspect hyperthyroidism played a role - controlled on current regimen. Continue per above   3. Hyperthyroidism => hypothyroidism: TSH markedly low during 12/22 admit, also with tachycardia/weight loss/cardiomyopathy.  Thyroid  US  consistent with thyroiditis. Patient started on methimazole .  4/23 thyroid  indices showed elevated TSH and low free T4.  Methimazole  stopped.  TSH was normal in 10/24.  - followed by endocrinology, reports to be in remission   4. Prior H/o Substance Use - she has not use since 2023, congratulated on efforts   F/u w/ Dr. Rolan in 6 months w/ repeat echo. If EF remains normal, can consider graduating from the [facility].   Caffie Shed PA-C  10/06/2024  [facility] [address] [facility] [place] [address] [phone] (office) [phone] (fax)

## 2024-10-06 NOTE — Addendum Note (Signed)
 Encounter addended by: Marcine Caffie HERO, PA-C on: 10/06/2024 9:07 AM  Actions taken: Problem List reviewed, Medication List reviewed, Allergies reviewed

## 2024-10-06 NOTE — Patient Instructions (Signed)
 Medication Changes:  No Changes In Medications at this time.   Lab Work:  Labs done today, your results will be available in MyChart, we will contact you for abnormal readings.  Follow-Up in: 6 MONTHS PLEASE CALL OUR OFFICE AROUND APRIL 2026 TO GET SCHEDULED FOR YOUR APPOINTMENT. PHONE NUMBER IS [phone] OPTION 2   At the [facility], you and your health needs are our priority. We have a designated team specialized in the treatment of Heart Failure. This Care Team includes your primary Heart Failure Specialized Cardiologist (physician), Advanced Practice Providers (APPs- Physician Assistants and Nurse Practitioners), and Pharmacist who all work together to provide you with the care you need, when you need it.   You may see any of the following providers on your designated Care Team at your next follow up:  Dr. Toribio Fuel Dr. Ezra Shuck Dr. Odis Brownie Greig Mosses, NP Caffie Shed, [place] [facility] [place], [place] Beckey Coe, NP Jordan Lee, NP Tinnie Redman, PharmD   Please be sure to bring in all your medications bottles to every appointment.   Need to Contact Us :  If you have any questions or concerns before your next appointment please send us  a message through [place] or call our office at [phone].    TO LEAVE A MESSAGE FOR THE NURSE SELECT OPTION 2, PLEASE LEAVE A MESSAGE INCLUDING: YOUR NAME DATE OF BIRTH CALL BACK NUMBER REASON FOR CALL**this is important as we prioritize the call backs  YOU WILL RECEIVE A CALL BACK THE SAME DAY AS LONG AS YOU CALL BEFORE 4:00 PM
